# Patient Record
Sex: Female | Born: 1955 | Race: White | Hispanic: No | Marital: Married | Smoking: Former smoker
Health system: Southern US, Community
[De-identification: ages and names within clinical notes are randomized; demographics above are authoritative.]

## PROBLEM LIST (undated history)

## (undated) DIAGNOSIS — E785 Hyperlipidemia, unspecified: Secondary | ICD-10-CM

## (undated) DIAGNOSIS — C801 Malignant (primary) neoplasm, unspecified: Secondary | ICD-10-CM

## (undated) DIAGNOSIS — Z1379 Encounter for other screening for genetic and chromosomal anomalies: Secondary | ICD-10-CM

## (undated) DIAGNOSIS — K219 Gastro-esophageal reflux disease without esophagitis: Secondary | ICD-10-CM

## (undated) DIAGNOSIS — F419 Anxiety disorder, unspecified: Secondary | ICD-10-CM

## (undated) DIAGNOSIS — M8580 Other specified disorders of bone density and structure, unspecified site: Secondary | ICD-10-CM

## (undated) DIAGNOSIS — H269 Unspecified cataract: Secondary | ICD-10-CM

## (undated) DIAGNOSIS — Z8249 Family history of ischemic heart disease and other diseases of the circulatory system: Secondary | ICD-10-CM

## (undated) DIAGNOSIS — K4090 Unilateral inguinal hernia, without obstruction or gangrene, not specified as recurrent: Secondary | ICD-10-CM

## (undated) DIAGNOSIS — R079 Chest pain, unspecified: Secondary | ICD-10-CM

## (undated) HISTORY — DX: Chest pain, unspecified: R07.9

## (undated) HISTORY — DX: Gastro-esophageal reflux disease without esophagitis: K21.9

## (undated) HISTORY — PX: HERNIA REPAIR: SHX51

## (undated) HISTORY — PX: BREAST EXCISIONAL BIOPSY: SUR124

## (undated) HISTORY — DX: Anxiety disorder, unspecified: F41.9

## (undated) HISTORY — DX: Other specified disorders of bone density and structure, unspecified site: M85.80

## (undated) HISTORY — DX: Encounter for other screening for genetic and chromosomal anomalies: Z13.79

## (undated) HISTORY — DX: Malignant (primary) neoplasm, unspecified: C80.1

## (undated) HISTORY — DX: Hyperlipidemia, unspecified: E78.5

## (undated) HISTORY — PX: COLONOSCOPY: SHX174

## (undated) HISTORY — DX: Unspecified cataract: H26.9

## (undated) HISTORY — DX: Family history of ischemic heart disease and other diseases of the circulatory system: Z82.49

## (undated) HISTORY — PX: TONSILLECTOMY: SUR1361

## (undated) HISTORY — PX: WISDOM TOOTH EXTRACTION: SHX21

## (undated) HISTORY — DX: Unilateral inguinal hernia, without obstruction or gangrene, not specified as recurrent: K40.90

---
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
## 1991-06-05 HISTORY — PX: BREAST SURGERY: SHX581

## 1998-04-21 ENCOUNTER — Ambulatory Visit (HOSPITAL_COMMUNITY): Admission: RE | Admit: 1998-04-21 | Discharge: 1998-04-21 | Payer: Self-pay

## 1998-04-21 ENCOUNTER — Encounter: Payer: Self-pay | Admitting: *Deleted

## 1999-03-20 ENCOUNTER — Other Ambulatory Visit: Admission: RE | Admit: 1999-03-20 | Discharge: 1999-03-20 | Payer: Self-pay | Admitting: *Deleted

## 1999-04-25 ENCOUNTER — Ambulatory Visit (HOSPITAL_COMMUNITY): Admission: RE | Admit: 1999-04-25 | Discharge: 1999-04-25 | Payer: Self-pay | Admitting: *Deleted

## 1999-04-25 ENCOUNTER — Encounter: Payer: Self-pay | Admitting: *Deleted

## 2000-04-29 ENCOUNTER — Encounter: Payer: Self-pay | Admitting: *Deleted

## 2000-04-29 ENCOUNTER — Ambulatory Visit (HOSPITAL_COMMUNITY): Admission: RE | Admit: 2000-04-29 | Discharge: 2000-04-29 | Payer: Self-pay | Admitting: *Deleted

## 2000-07-03 ENCOUNTER — Other Ambulatory Visit: Admission: RE | Admit: 2000-07-03 | Discharge: 2000-07-03 | Payer: Self-pay | Admitting: *Deleted

## 2001-05-05 ENCOUNTER — Ambulatory Visit (HOSPITAL_COMMUNITY): Admission: RE | Admit: 2001-05-05 | Discharge: 2001-05-05 | Payer: Self-pay | Admitting: *Deleted

## 2001-05-05 ENCOUNTER — Encounter: Payer: Self-pay | Admitting: *Deleted

## 2001-07-30 ENCOUNTER — Other Ambulatory Visit: Admission: RE | Admit: 2001-07-30 | Discharge: 2001-07-30 | Payer: Self-pay | Admitting: *Deleted

## 2002-05-06 ENCOUNTER — Encounter: Payer: Self-pay | Admitting: *Deleted

## 2002-05-06 ENCOUNTER — Ambulatory Visit (HOSPITAL_COMMUNITY): Admission: RE | Admit: 2002-05-06 | Discharge: 2002-05-06 | Payer: Self-pay | Admitting: *Deleted

## 2002-05-07 ENCOUNTER — Encounter: Payer: Self-pay | Admitting: *Deleted

## 2002-05-07 ENCOUNTER — Encounter: Admission: RE | Admit: 2002-05-07 | Discharge: 2002-05-07 | Payer: Self-pay | Admitting: *Deleted

## 2002-09-02 ENCOUNTER — Other Ambulatory Visit: Admission: RE | Admit: 2002-09-02 | Discharge: 2002-09-02 | Payer: Self-pay | Admitting: *Deleted

## 2003-06-10 ENCOUNTER — Ambulatory Visit (HOSPITAL_COMMUNITY): Admission: RE | Admit: 2003-06-10 | Discharge: 2003-06-10 | Payer: Self-pay | Admitting: *Deleted

## 2003-10-14 ENCOUNTER — Other Ambulatory Visit: Admission: RE | Admit: 2003-10-14 | Discharge: 2003-10-14 | Payer: Self-pay | Admitting: *Deleted

## 2004-06-12 ENCOUNTER — Ambulatory Visit (HOSPITAL_COMMUNITY): Admission: RE | Admit: 2004-06-12 | Discharge: 2004-06-12 | Payer: Self-pay | Admitting: *Deleted

## 2004-11-22 ENCOUNTER — Encounter: Admission: RE | Admit: 2004-11-22 | Discharge: 2004-11-22 | Payer: Self-pay | Admitting: Family Medicine

## 2004-11-27 ENCOUNTER — Other Ambulatory Visit: Admission: RE | Admit: 2004-11-27 | Discharge: 2004-11-27 | Payer: Self-pay | Admitting: *Deleted

## 2004-12-04 ENCOUNTER — Ambulatory Visit (HOSPITAL_COMMUNITY): Admission: RE | Admit: 2004-12-04 | Discharge: 2004-12-04 | Payer: Self-pay | Admitting: Family Medicine

## 2004-12-04 ENCOUNTER — Encounter: Admission: RE | Admit: 2004-12-04 | Discharge: 2004-12-04 | Payer: Self-pay | Admitting: Family Medicine

## 2005-06-22 ENCOUNTER — Ambulatory Visit (HOSPITAL_COMMUNITY): Admission: RE | Admit: 2005-06-22 | Discharge: 2005-06-22 | Payer: Self-pay | Admitting: Family Medicine

## 2006-02-26 ENCOUNTER — Ambulatory Visit (HOSPITAL_COMMUNITY): Admission: RE | Admit: 2006-02-26 | Discharge: 2006-02-26 | Payer: Self-pay | Admitting: *Deleted

## 2006-05-13 ENCOUNTER — Emergency Department (HOSPITAL_COMMUNITY): Admission: EM | Admit: 2006-05-13 | Discharge: 2006-05-13 | Payer: Self-pay | Admitting: Emergency Medicine

## 2006-07-24 ENCOUNTER — Ambulatory Visit (HOSPITAL_COMMUNITY): Admission: RE | Admit: 2006-07-24 | Discharge: 2006-07-24 | Payer: Self-pay | Admitting: Family Medicine

## 2006-08-08 ENCOUNTER — Ambulatory Visit (HOSPITAL_COMMUNITY): Admission: RE | Admit: 2006-08-08 | Discharge: 2006-08-08 | Payer: Self-pay | Admitting: Family Medicine

## 2007-07-28 ENCOUNTER — Ambulatory Visit (HOSPITAL_COMMUNITY): Admission: RE | Admit: 2007-07-28 | Discharge: 2007-07-28 | Payer: Self-pay | Admitting: Family Medicine

## 2007-09-08 ENCOUNTER — Encounter: Admission: RE | Admit: 2007-09-08 | Discharge: 2007-09-08 | Payer: Self-pay | Admitting: Family Medicine

## 2007-09-19 ENCOUNTER — Encounter: Admission: RE | Admit: 2007-09-19 | Discharge: 2007-09-19 | Payer: Self-pay | Admitting: Family Medicine

## 2008-07-29 ENCOUNTER — Ambulatory Visit (HOSPITAL_COMMUNITY): Admission: RE | Admit: 2008-07-29 | Discharge: 2008-07-29 | Payer: Self-pay | Admitting: Family Medicine

## 2008-09-23 ENCOUNTER — Ambulatory Visit (HOSPITAL_COMMUNITY): Admission: RE | Admit: 2008-09-23 | Discharge: 2008-09-23 | Payer: Self-pay | Admitting: Family Medicine

## 2009-08-01 ENCOUNTER — Ambulatory Visit (HOSPITAL_COMMUNITY): Admission: RE | Admit: 2009-08-01 | Discharge: 2009-08-01 | Payer: Self-pay | Admitting: Family Medicine

## 2010-06-25 ENCOUNTER — Encounter: Payer: Self-pay | Admitting: Family Medicine

## 2010-07-18 ENCOUNTER — Other Ambulatory Visit (HOSPITAL_COMMUNITY): Payer: Self-pay | Admitting: Family Medicine

## 2010-07-18 DIAGNOSIS — Z1231 Encounter for screening mammogram for malignant neoplasm of breast: Secondary | ICD-10-CM

## 2010-08-03 ENCOUNTER — Other Ambulatory Visit: Payer: Self-pay | Admitting: Family Medicine

## 2010-08-03 ENCOUNTER — Ambulatory Visit (HOSPITAL_COMMUNITY)
Admission: RE | Admit: 2010-08-03 | Discharge: 2010-08-03 | Disposition: A | Discharge status: Home / Self Care | Payer: 59 | Source: Ambulatory Visit | Attending: Family Medicine | Admitting: Family Medicine

## 2010-08-03 DIAGNOSIS — N6324 Unspecified lump in the left breast, lower inner quadrant: Secondary | ICD-10-CM

## 2010-08-03 DIAGNOSIS — Z1231 Encounter for screening mammogram for malignant neoplasm of breast: Secondary | ICD-10-CM

## 2010-08-08 ENCOUNTER — Ambulatory Visit
Admission: RE | Admit: 2010-08-08 | Discharge: 2010-08-08 | Disposition: A | Discharge status: Home / Self Care | Payer: 59 | Source: Ambulatory Visit | Attending: Family Medicine | Admitting: Family Medicine

## 2010-08-08 DIAGNOSIS — N6324 Unspecified lump in the left breast, lower inner quadrant: Secondary | ICD-10-CM

## 2011-01-05 ENCOUNTER — Other Ambulatory Visit: Payer: Self-pay | Admitting: Family Medicine

## 2011-01-05 ENCOUNTER — Other Ambulatory Visit (HOSPITAL_COMMUNITY)
Admission: RE | Admit: 2011-01-05 | Discharge: 2011-01-05 | Disposition: A | Discharge status: Home / Self Care | Payer: 59 | Source: Ambulatory Visit | Attending: Family Medicine | Admitting: Family Medicine

## 2011-01-05 DIAGNOSIS — Z124 Encounter for screening for malignant neoplasm of cervix: Secondary | ICD-10-CM | POA: Insufficient documentation

## 2011-08-15 ENCOUNTER — Other Ambulatory Visit (HOSPITAL_COMMUNITY): Payer: Self-pay | Admitting: Family Medicine

## 2011-08-15 DIAGNOSIS — Z1231 Encounter for screening mammogram for malignant neoplasm of breast: Secondary | ICD-10-CM

## 2011-09-19 ENCOUNTER — Ambulatory Visit (HOSPITAL_COMMUNITY): Payer: 59

## 2011-09-26 ENCOUNTER — Ambulatory Visit (HOSPITAL_COMMUNITY): Payer: 59

## 2011-10-22 ENCOUNTER — Ambulatory Visit (HOSPITAL_COMMUNITY)
Admission: RE | Admit: 2011-10-22 | Discharge: 2011-10-22 | Disposition: A | Discharge status: Home / Self Care | Payer: PRIVATE HEALTH INSURANCE | Source: Ambulatory Visit | Attending: Family Medicine | Admitting: Family Medicine

## 2011-10-22 DIAGNOSIS — Z1231 Encounter for screening mammogram for malignant neoplasm of breast: Secondary | ICD-10-CM | POA: Insufficient documentation

## 2012-10-14 ENCOUNTER — Other Ambulatory Visit (HOSPITAL_COMMUNITY): Payer: Self-pay | Admitting: Family Medicine

## 2012-10-14 ENCOUNTER — Telehealth: Payer: Self-pay | Admitting: Internal Medicine

## 2012-10-14 DIAGNOSIS — Z1231 Encounter for screening mammogram for malignant neoplasm of breast: Secondary | ICD-10-CM

## 2012-10-14 NOTE — Telephone Encounter (Signed)
Left message for patient that I will be mailing out a lab slip to her. If she has not had any recent labs drawn through another provider, then she will need to get it. If she has , then she can just bring copies if those with her to the appointment.

## 2012-10-14 NOTE — Telephone Encounter (Signed)
Pt has appt w/ dr. Debara Pickett 10/31/12. Does she need to have lab work prior to visit?  Mail lab slip.

## 2012-10-24 ENCOUNTER — Ambulatory Visit (HOSPITAL_COMMUNITY): Payer: PRIVATE HEALTH INSURANCE

## 2012-10-31 ENCOUNTER — Ambulatory Visit: Payer: PRIVATE HEALTH INSURANCE | Admitting: Internal Medicine

## 2012-11-04 ENCOUNTER — Other Ambulatory Visit: Payer: Self-pay | Admitting: Internal Medicine

## 2012-11-04 LAB — LIPID PANEL
Cholesterol: 287 mg/dL — ABNORMAL HIGH (ref 0–200)
Total CHOL/HDL Ratio: 3.8 Ratio
VLDL: 16 mg/dL (ref 0–40)

## 2012-11-04 LAB — COMPREHENSIVE METABOLIC PANEL
AST: 15 U/L (ref 0–37)
Alkaline Phosphatase: 86 U/L (ref 39–117)
BUN: 11 mg/dL (ref 6–23)
CO2: 26 mEq/L (ref 19–32)
Chloride: 104 mEq/L (ref 96–112)
Creat: 0.86 mg/dL (ref 0.50–1.10)
Glucose, Bld: 99 mg/dL (ref 70–99)
Sodium: 141 mEq/L (ref 135–145)

## 2012-11-04 LAB — CBC
Hemoglobin: 14.2 g/dL (ref 12.0–15.0)
MCV: 89.4 fL (ref 78.0–100.0)
RDW: 14.2 % (ref 11.5–15.5)
WBC: 5.5 10*3/uL (ref 4.0–10.5)

## 2012-11-05 ENCOUNTER — Telehealth: Payer: Self-pay | Admitting: Internal Medicine

## 2012-11-05 NOTE — Telephone Encounter (Signed)
See result note.  

## 2012-11-05 NOTE — Telephone Encounter (Signed)
Theresa Frazier is returning your call about her test results .Marland Kitchen Please call her [phone]  thanks

## 2012-11-06 ENCOUNTER — Ambulatory Visit (HOSPITAL_COMMUNITY): Payer: PRIVATE HEALTH INSURANCE

## 2012-11-06 ENCOUNTER — Ambulatory Visit (HOSPITAL_COMMUNITY)
Admission: RE | Admit: 2012-11-06 | Discharge: 2012-11-06 | Disposition: A | Discharge status: Home / Self Care | Payer: 59 | Source: Ambulatory Visit | Attending: Family Medicine | Admitting: Family Medicine

## 2012-11-06 DIAGNOSIS — Z1231 Encounter for screening mammogram for malignant neoplasm of breast: Secondary | ICD-10-CM | POA: Insufficient documentation

## 2012-11-07 ENCOUNTER — Encounter: Payer: Self-pay | Admitting: Internal Medicine

## 2012-11-07 ENCOUNTER — Ambulatory Visit (INDEPENDENT_AMBULATORY_CARE_PROVIDER_SITE_OTHER): Payer: 59 | Admitting: Internal Medicine

## 2012-11-07 VITALS — BP 120/82 | HR 71 | Ht 62.0 in | Wt 174.7 lb

## 2012-11-07 DIAGNOSIS — R5383 Other fatigue: Secondary | ICD-10-CM | POA: Insufficient documentation

## 2012-11-07 DIAGNOSIS — Z8249 Family history of ischemic heart disease and other diseases of the circulatory system: Secondary | ICD-10-CM | POA: Insufficient documentation

## 2012-11-07 DIAGNOSIS — E785 Hyperlipidemia, unspecified: Secondary | ICD-10-CM

## 2012-11-07 DIAGNOSIS — R5381 Other malaise: Secondary | ICD-10-CM

## 2012-11-07 MED ORDER — ROSUVASTATIN CALCIUM 5 MG PO TABS: 5.0000 mg | ORAL_TABLET | Freq: Every day | ORAL | Status: DC

## 2012-11-07 NOTE — Patient Instructions (Addendum)
Dr. Debara Pickett prescribed Crestor 5mg  daily. If you are able to tolerate this medication, please give [place] a call in 2 months, as we would like to check your cholesterol levels.   Follow up with Dr. Debara Pickett in 1 year.

## 2012-11-07 NOTE — Progress Notes (Signed)
OFFICE NOTE  Chief Complaint:  Routine followup  Primary Care Physician: Osborne Casco, MD  HPI:  Theresa Frazier is a 57 year old female with a history of dyslipidemia, GERD, atypical chest pain in the past with a negative stress test. There is a family history of coronary disease and she underwent [place] which showed abnormal LDL particle sizes with high LPa and abnormal apoB protein. She had a normal CRP and we discussed possible treatment with medication at that time; however, she agreed to work on diet and exercise. It has now been 15 months since that time and, unfortunately, there has been little exercise and about only 2 pounds of weight loss. I went ahead and performed a lipid NMR which demonstrated total particle number of 1893, which is elevated. LDL calculated at 140, HDL of 77 with a normal HDL particle number and a small LDL particle number of 704. This overall does increase her risk of coronary disease; however, she does have a low LP-IR score of 19. This is in part due to her high HDL cholesterol. Symptomatically denies any chest pain, shortness of breath, palpitations, presyncope or syncopal symptoms. At her last visit I gave her a prescription for Crestor, however she reports she never took the medicine. She has concerns about side effects and actually donated the samples a local shelter because she felt bad throwing the medicine out.  We did have a scheduled recheck of her cholesterol which indicates a total cholesterol of 287 and LDL of 195.  This is increased despite her efforts at exercise and weight loss. Clearly there is a genetic component driving or elevated cholesterol. She continues to be at higher risk for cardiovascular events.  He did have a CBC and CMP which were both normal.  PMHx:  Past Medical History  Diagnosis Date  . Dyslipidemia   . GERD (gastroesophageal reflux disease)   . Chest pain     atypical, myoview 05/31/05-EF 75%, low risk   . Family history of early CAD     History reviewed. No pertinent past surgical history.  FAMHx:  Family History  Problem Relation Age of Onset  .       SOCHx:   reports that she quit smoking about 27 years ago. Her smoking use included Cigarettes. She smoked 0.00 packs per day for 7 years. She does not have any smokeless tobacco history on file. She reports that  drinks alcohol. She reports that she does not use illicit drugs.  ALLERGIES:  No Known Allergies  ROS: A comprehensive review of systems was negative except for: Constitutional: positive for fatigue  HOME MEDS: Current Outpatient Prescriptions  Medication Sig Dispense Refill  . Calcium-Magnesium-Vitamin D (CALCIUM MAGNESIUM PO) Take 3 tablets by mouth. 1000mg  calcium, 800 IU vitamin D, 500mg  magnesium      . Cholecalciferol (VITAMIN D) 400 UNIT/ML LIQD Take 4,000 Units by mouth daily.      Marland Kitchen Cod Liver Oil CAPS Take 2 capsules by mouth daily.      . Multiple Vitamins-Minerals (MULTIVITAMIN PO) Take 1 tablet by mouth daily.      . NON FORMULARY Ultra Emu Oil 3 gel caps daily      . omega-3 acid ethyl esters (LOVAZA) 1 G capsule Take 1 g by mouth 2 (two) times daily.      . rosuvastatin (CRESTOR) 5 MG tablet Take 1 tablet (5 mg total) by mouth daily.  30 tablet  3   No current facility-administered medications for this  visit.    LABS/IMAGING: No results found for this or any previous visit (from the past 48 hour(s)). Mm Digital Screening  11/07/2012   *RADIOLOGY REPORT*  Clinical Data: Screening.  DIGITAL SCREENING BILATERAL MAMMOGRAM WITH CAD  Comparison:  Previous exams.  FINDINGS:  ACR Breast Density Category 2: There is a scattered fibroglandular pattern.  There are no findings suspicious for malignancy.  Images were processed with CAD.  IMPRESSION: No mammographic evidence of malignancy.  A result letter of this screening mammogram will be mailed directly to the patient.  RECOMMENDATION: Screening mammogram in one  year. (Code:SM-B-01Y)  BI-RADS CATEGORY 1:  Negative.   Original Report Authenticated By: Skipper Cliche, M.D.    VITALS: BP 120/82  Pulse 71  Ht 5\' 2"  (1.575 m)  Wt 174 lb 11.2 oz (79.243 kg)  BMI 31.94 kg/m2  EXAM: General appearance: alert and no distress Neck: no adenopathy, no carotid bruit, no JVD, supple, symmetrical, trachea midline and thyroid not enlarged, symmetric, no tenderness/mass/nodules Lungs: clear to auscultation bilaterally Heart: regular rate and rhythm, S1, S2 normal, no murmur, click, rub or gallop Abdomen: soft, non-tender; bowel sounds normal; no masses,  no organomegaly Extremities: extremities normal, atraumatic, no cyanosis or edema Pulses: 2+ and symmetric Skin: Skin color, texture, turgor normal. No rashes or lesions Neurologic: Grossly normal  EKG: Sinus rhythm at 71  ASSESSMENT: 1. Hyperlipidemia 2. Fatigue  PLAN: 1.   Mr. Fulton Mole continues to have mild regarding the elevated cholesterol and her risk. With her high LDL greater than 190, she continues to be at risk for cardiovascular events. I strongly recommend cholesterol medication and again given her prescription for Crestor. Hopefully she will begin taking it. She takes it for 3 months I've asked her to contact her office and we'll recheck a cholesterol profile. She may need of course increase in the dose as it is unlikely to reduce her LDL by greater than 50%. She understands this. Of also encouraged her to continue to get more exercise. Plan to see her back annually or sooner as necessary.  Pixie Casino, MD, [facility] Attending Cardiologist [facility] 11/07/2012, 11:19 AM

## 2013-04-09 ENCOUNTER — Other Ambulatory Visit: Payer: Self-pay

## 2013-12-01 ENCOUNTER — Other Ambulatory Visit (HOSPITAL_COMMUNITY): Payer: Self-pay | Admitting: Family Medicine

## 2013-12-01 DIAGNOSIS — Z1231 Encounter for screening mammogram for malignant neoplasm of breast: Secondary | ICD-10-CM

## 2013-12-08 ENCOUNTER — Ambulatory Visit (HOSPITAL_COMMUNITY): Payer: Self-pay

## 2013-12-15 ENCOUNTER — Ambulatory Visit (HOSPITAL_COMMUNITY): Payer: Self-pay

## 2013-12-17 ENCOUNTER — Ambulatory Visit (HOSPITAL_COMMUNITY)
Admission: RE | Admit: 2013-12-17 | Discharge: 2013-12-17 | Disposition: A | Discharge status: Home / Self Care | Payer: BC Managed Care – PPO | Source: Ambulatory Visit | Attending: Family Medicine | Admitting: Family Medicine

## 2013-12-17 DIAGNOSIS — Z1231 Encounter for screening mammogram for malignant neoplasm of breast: Secondary | ICD-10-CM | POA: Insufficient documentation

## 2014-11-26 ENCOUNTER — Other Ambulatory Visit: Payer: Self-pay | Admitting: Adult Health

## 2014-11-26 DIAGNOSIS — Z1231 Encounter for screening mammogram for malignant neoplasm of breast: Secondary | ICD-10-CM

## 2014-12-22 ENCOUNTER — Ambulatory Visit (HOSPITAL_COMMUNITY)
Admission: RE | Admit: 2014-12-22 | Discharge: 2014-12-22 | Disposition: A | Discharge status: Home / Self Care | Payer: BLUE CROSS/BLUE SHIELD | Source: Ambulatory Visit | Attending: Adult Health | Admitting: Adult Health

## 2014-12-22 DIAGNOSIS — Z1231 Encounter for screening mammogram for malignant neoplasm of breast: Secondary | ICD-10-CM | POA: Diagnosis not present

## 2015-06-05 DIAGNOSIS — C569 Malignant neoplasm of unspecified ovary: Secondary | ICD-10-CM

## 2015-06-05 HISTORY — DX: Malignant neoplasm of unspecified ovary: C56.9

## 2015-09-06 DIAGNOSIS — M542 Cervicalgia: Secondary | ICD-10-CM | POA: Diagnosis not present

## 2015-09-20 DIAGNOSIS — M542 Cervicalgia: Secondary | ICD-10-CM | POA: Diagnosis not present

## 2015-09-27 DIAGNOSIS — M542 Cervicalgia: Secondary | ICD-10-CM | POA: Diagnosis not present

## 2015-11-14 ENCOUNTER — Other Ambulatory Visit (HOSPITAL_COMMUNITY)
Admission: RE | Admit: 2015-11-14 | Discharge: 2015-11-14 | Disposition: A | Discharge status: Home / Self Care | Payer: BLUE CROSS/BLUE SHIELD | Source: Ambulatory Visit | Attending: Family Medicine | Admitting: Family Medicine

## 2015-11-14 DIAGNOSIS — Z124 Encounter for screening for malignant neoplasm of cervix: Secondary | ICD-10-CM | POA: Insufficient documentation

## 2015-11-14 DIAGNOSIS — E782 Mixed hyperlipidemia: Secondary | ICD-10-CM | POA: Diagnosis not present

## 2015-11-14 DIAGNOSIS — Z0000 Encounter for general adult medical examination without abnormal findings: Secondary | ICD-10-CM | POA: Diagnosis not present

## 2015-11-14 DIAGNOSIS — N951 Menopausal and female climacteric states: Secondary | ICD-10-CM | POA: Diagnosis not present

## 2015-11-14 DIAGNOSIS — R0683 Snoring: Secondary | ICD-10-CM | POA: Diagnosis not present

## 2015-11-15 ENCOUNTER — Other Ambulatory Visit: Payer: Self-pay | Admitting: Family Medicine

## 2015-11-22 LAB — CYTOLOGY - PAP

## 2015-12-14 DIAGNOSIS — M8588 Other specified disorders of bone density and structure, other site: Secondary | ICD-10-CM | POA: Diagnosis not present

## 2015-12-16 ENCOUNTER — Other Ambulatory Visit: Payer: Self-pay | Admitting: Family Medicine

## 2015-12-16 DIAGNOSIS — Z1231 Encounter for screening mammogram for malignant neoplasm of breast: Secondary | ICD-10-CM

## 2015-12-19 DIAGNOSIS — Z1211 Encounter for screening for malignant neoplasm of colon: Secondary | ICD-10-CM | POA: Diagnosis not present

## 2015-12-26 ENCOUNTER — Ambulatory Visit
Admission: RE | Admit: 2015-12-26 | Discharge: 2015-12-26 | Disposition: A | Discharge status: Home / Self Care | Payer: BLUE CROSS/BLUE SHIELD | Source: Ambulatory Visit | Attending: Family Medicine | Admitting: Family Medicine

## 2015-12-26 DIAGNOSIS — Z1231 Encounter for screening mammogram for malignant neoplasm of breast: Secondary | ICD-10-CM | POA: Diagnosis not present

## 2015-12-28 ENCOUNTER — Other Ambulatory Visit: Payer: Self-pay | Admitting: Family Medicine

## 2015-12-28 DIAGNOSIS — R928 Other abnormal and inconclusive findings on diagnostic imaging of breast: Secondary | ICD-10-CM

## 2015-12-30 ENCOUNTER — Ambulatory Visit
Admission: RE | Admit: 2015-12-30 | Discharge: 2015-12-30 | Disposition: A | Discharge status: Home / Self Care | Payer: BLUE CROSS/BLUE SHIELD | Source: Ambulatory Visit | Attending: Family Medicine | Admitting: Family Medicine

## 2015-12-30 DIAGNOSIS — R922 Inconclusive mammogram: Secondary | ICD-10-CM | POA: Diagnosis not present

## 2015-12-30 DIAGNOSIS — N6489 Other specified disorders of breast: Secondary | ICD-10-CM | POA: Diagnosis not present

## 2015-12-30 DIAGNOSIS — R928 Other abnormal and inconclusive findings on diagnostic imaging of breast: Secondary | ICD-10-CM

## 2016-02-24 DIAGNOSIS — H40011 Open angle with borderline findings, low risk, right eye: Secondary | ICD-10-CM | POA: Diagnosis not present

## 2016-02-24 DIAGNOSIS — H40012 Open angle with borderline findings, low risk, left eye: Secondary | ICD-10-CM | POA: Diagnosis not present

## 2016-04-25 ENCOUNTER — Other Ambulatory Visit: Payer: Self-pay | Admitting: Family Medicine

## 2016-04-25 DIAGNOSIS — R102 Pelvic and perineal pain: Secondary | ICD-10-CM | POA: Diagnosis not present

## 2016-04-25 DIAGNOSIS — R1900 Intra-abdominal and pelvic swelling, mass and lump, unspecified site: Secondary | ICD-10-CM | POA: Diagnosis not present

## 2016-05-04 ENCOUNTER — Ambulatory Visit
Admission: RE | Admit: 2016-05-04 | Discharge: 2016-05-04 | Disposition: A | Discharge status: Home / Self Care | Payer: BLUE CROSS/BLUE SHIELD | Source: Ambulatory Visit | Attending: Family Medicine | Admitting: Family Medicine

## 2016-05-04 ENCOUNTER — Other Ambulatory Visit: Payer: BLUE CROSS/BLUE SHIELD

## 2016-05-04 DIAGNOSIS — R102 Pelvic and perineal pain: Secondary | ICD-10-CM

## 2016-05-04 DIAGNOSIS — R1907 Generalized intra-abdominal and pelvic swelling, mass and lump: Secondary | ICD-10-CM | POA: Diagnosis not present

## 2016-05-08 ENCOUNTER — Other Ambulatory Visit: Payer: BLUE CROSS/BLUE SHIELD

## 2016-05-14 ENCOUNTER — Encounter: Payer: Self-pay | Admitting: Gynecologic Oncology

## 2016-05-14 ENCOUNTER — Other Ambulatory Visit (HOSPITAL_BASED_OUTPATIENT_CLINIC_OR_DEPARTMENT_OTHER): Payer: BLUE CROSS/BLUE SHIELD

## 2016-05-14 ENCOUNTER — Ambulatory Visit: Payer: BLUE CROSS/BLUE SHIELD | Attending: Gynecologic Oncology | Admitting: Gynecologic Oncology

## 2016-05-14 VITALS — BP 154/92 | HR 82 | Temp 98.3°F | Resp 16 | Ht 62.0 in | Wt 165.7 lb

## 2016-05-14 DIAGNOSIS — Z87891 Personal history of nicotine dependence: Secondary | ICD-10-CM | POA: Diagnosis not present

## 2016-05-14 DIAGNOSIS — K219 Gastro-esophageal reflux disease without esophagitis: Secondary | ICD-10-CM | POA: Insufficient documentation

## 2016-05-14 DIAGNOSIS — Z8249 Family history of ischemic heart disease and other diseases of the circulatory system: Secondary | ICD-10-CM | POA: Insufficient documentation

## 2016-05-14 DIAGNOSIS — R1900 Intra-abdominal and pelvic swelling, mass and lump, unspecified site: Secondary | ICD-10-CM

## 2016-05-14 DIAGNOSIS — E785 Hyperlipidemia, unspecified: Secondary | ICD-10-CM | POA: Diagnosis not present

## 2016-05-14 DIAGNOSIS — N839 Noninflammatory disorder of ovary, fallopian tube and broad ligament, unspecified: Secondary | ICD-10-CM | POA: Diagnosis not present

## 2016-05-14 DIAGNOSIS — R1909 Other intra-abdominal and pelvic swelling, mass and lump: Secondary | ICD-10-CM | POA: Insufficient documentation

## 2016-05-14 NOTE — Patient Instructions (Signed)
CT scan at [facility] located at [address] at 1:30pm need to arrive at 12:30pm.  Appointment for CA 125 today at 10:45.                Preparing for your Surgery  Plan for surgery on .12/28 or January 18th, depending on CT result.,with Dr. Everitt Amber. Pre-operative Testing -You will receive a phone call from presurgical testing at [facility] to arrange for a pre-operative testing appointment before your surgery.  This appointment normally occurs one to two weeks before your scheduled surgery.   -Bring your insurance card, copy of an advanced directive if applicable, medication list  -At that visit, you will be asked to sign a consent for a possible blood transfusion in case a transfusion becomes necessary during surgery.  The need for a blood transfusion is rare but having consent is a necessary part of your care.     -You should not be taking blood thinners or aspirin at least ten days prior to surgery unless instructed by your surgeon.  Day Before Surgery at [facility] will be asked to take in a light diet the day before surgery.  Avoid carbonated beverages.  You will be advised to have nothing to eat or drink after midnight the evening before.     Eat a light diet the day before surgery.  Examples including soups, broths,  toast, yogurt, mashed potatoes.  Things to avoid include carbonated beverages  (fizzy beverages), raw fruits and raw vegetables, or beans.    If your bowels are filled with gas, your surgeon will have difficulty  visualizing your pelvic organs which increases your surgical risks.  Your role in recovery Your role is to become active as soon as directed by your doctor, while still giving yourself time to heal.  Rest when you feel tired. You will be asked to do the following in order to speed your recovery:  - Cough and breathe deeply. This helps toclear and expand your lungs and can prevent pneumonia. You may be given a spirometer to practice  deep breathing. A staff member will show you how to use the spirometer. - Do mild physical activity. Walking or moving your legs help your circulation and body functions return to normal. A staff member will help you when you try to walk and will provide you with simple exercises. Do not try to get up or walk alone the first time. - Actively manage your pain. Managing your pain lets you move in comfort. We will ask you to rate your pain on a scale of zero to 10. It is your responsibility to tell your doctor or nurse where and how much you hurt so your pain can be treated.  Special Considerations -If you are diabetic, you may be placed on insulin after surgery to have closer control over your blood sugars to promote healing and recovery.  This does not mean that you will be discharged on insulin.  If applicable, your oral antidiabetics will be resumed when you are tolerating a solid diet.  -Your final pathology results from surgery should be available by the Friday after surgery and the results will be relayed to you when available.  Eat a light diet the day before surgery.  Examples including soups, broths, toast, yogurt, mashed potatoes.  Things to avoid include carbonated beverages (fizzy beverages), raw fruits and raw vegetables, or beans.   If your bowels are filled with gas, your surgeon will have difficulty  visualizing your pelvic organs which increases your surgical risks. Blood Transfusion Information WHAT IS A BLOOD TRANSFUSION? A transfusion is the replacement of blood or some of its parts. Blood is made up of multiple cells which provide different functions.  Red blood cells carry oxygen and are used for blood loss replacement.  White blood cells fight against infection.  Platelets control bleeding.  Plasma helps clot blood.  Other blood products are available for specialized needs, such as hemophilia or other clotting disorders. BEFORE THE TRANSFUSION  Who gives blood for  transfusions?   You may be able to donate blood to be used at a later date on yourself (autologous donation).  Relatives can be asked to donate blood. This is generally not any safer than if you have received blood from a stranger. The same precautions are taken to ensure safety when a relative's blood is donated.  Healthy volunteers who are fully evaluated to make sure their blood is safe. This is blood bank blood. Transfusion therapy is the safest it has ever been in the practice of medicine. Before blood is taken from a donor, a complete history is taken to make sure that person has no history of diseases nor engages in risky social behavior (examples are intravenous drug use or sexual activity with multiple partners). The donor's travel history is screened to minimize risk of transmitting infections, such as malaria. The donated blood is tested for signs of infectious diseases, such as HIV and hepatitis. The blood is then tested to be sure it is compatible with you in order to minimize the chance of a transfusion reaction. If you or a relative donates blood, this is often done in anticipation of surgery and is not appropriate for emergency situations. It takes many days to process the donated blood. RISKS AND COMPLICATIONS Although transfusion therapy is very safe and saves many lives, the main dangers of transfusion include:   Getting an infectious disease.  Developing a transfusion reaction. This is an allergic reaction to something in the blood you were given. Every precaution is taken to prevent this. The decision to have a blood transfusion has been considered carefully by your caregiver before blood is given. Blood is not given unless the benefits outweigh the risks.

## 2016-05-14 NOTE — Progress Notes (Signed)
Consult Note: Gyn-Onc  Consult was requested by Dr. Marisue Humble and Dr Laurann Montana for the evaluation of Theresa Frazier 60 y.o. female  CC:  Chief Complaint  Patient presents with  . pelvic mass    Assessment/Plan:  Theresa Frazier  is a 60 y.o.  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  I reviewed her [place] images from 05/04/16 with the patient.  I discussed that there are some concerning features on imaging (the solid components of the mass, its vascularity and the size), however there were also reassuring findings including how mobile it felt on exam and the lack of apparent extraovarian disease.  I recommend its removal with hysterectomy, BSO and possible staging pending frozen section results.  We have ordered CA 125 today and CT abdomen and pelvis to better evaluate the upper abdomen and the likelihood of malignancy and will determine route of surgery (open or robotic) based upon these results.  I discussed that if malignancy is identified, additional therapy (such as chemotherapy) is usually prescribed. I discussed surgical risks including  bleeding, infection, damage to internal organs (such as bladder,ureters, bowels), blood clot, reoperation and rehospitalization.  I discussed that we will better understand anticipated hospital stay and recovery when we know which surgical route is most appropriate.   HPI: Theresa Frazier is a 60 year old parous (P2) woman who is seen in consultation at the request of Dr Marisue Humble and Dr Laurann Montana for a complex cystic and solid pelvic mass.  The patient reported to her PCP's that she had abdominal bloating and fullness for 6-8 weeks since October 2017 when she saw them on 04/25/16. They performed a pelvic examination on that visit and appreciated a pelvic mass. A TVUS was ordered and performed on 05/04/16 and revealed a uterus measuring 8.4x3.7x4.8cm with a 7cm endometrial stripe (she has had no bleeding  symptoms) and the ovaries were not discretely seen. Instead, there is a large complex cystic and solid mass at midline, extending into the right and left adnexum with internal blood flow. The mass measures 11 x 7.4 x 14.1 cm. Separate ovaries are not identified.  A CT scan had been ordered but canceled upon the US findings and plan for consultation with me.  She is feeling fairly well. Her pants fit differently lately and she feels she may have lost weight. Her bladder and bowels are moving normally.  She denies pain.  She has no family history for breast/ovarian cancer or any other significant cancer history.  Her surgical history is significant for tonsillectomy only (no abdominal surgery).  Current Meds:  Outpatient Encounter Prescriptions as of 05/14/2016  Medication Sig  . BLACK CURRANT SEED OIL PO Take by mouth.  . Calcium-Magnesium-Vitamin D (CALCIUM MAGNESIUM PO) Take 3 tablets by mouth. 1000mg  calcium, 800 IU vitamin D, 500mg  magnesium  . Cholecalciferol (VITAMIN D) 400 UNIT/ML LIQD Take 4,000 Units by mouth daily.  Marland Kitchen Cod Liver Oil CAPS Take 2 capsules by mouth daily.  . Multiple Vitamins-Minerals (MULTIVITAMIN PO) Take 1 tablet by mouth daily.  . NON FORMULARY Ultra Emu Oil 3 gel caps daily  . omega-3 acid ethyl esters (LOVAZA) 1 G capsule Take 1 g by mouth 2 (two) times daily.  . rosuvastatin (CRESTOR) 5 MG tablet Take 1 tablet (5 mg total) by mouth daily. (Patient not taking: Reported on 05/14/2016)   No facility-administered encounter medications on file as of 05/14/2016.     Allergy:  No Known Allergies  Social Hx:   Social History   Social History  . Marital status: Married    Spouse name: N/A  . Number of children: N/A  . Years of education: N/A   Occupational History  . Not on file.   Social History Main Topics  . Smoking status: Former Smoker    Years: 7.00    Types: Cigarettes    Quit date: 11/07/1985  . Smokeless tobacco: Never Used  . Alcohol use No      Comment: occasional wine  . Drug use: No  . Sexual activity: Not on file   Other Topics Concern  . Not on file   Social History Narrative  . No narrative on file    Past Surgical Hx: History reviewed. No pertinent surgical history.  Past Medical Hx:  Past Medical History:  Diagnosis Date  . 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 Dyslipidemia   . Family history of early CAD   . GERD (gastroesophageal reflux disease)     Past Gynecological History:  SVD x 2 No LMP recorded. Patient is postmenopausal.  Family Hx: History reviewed. No pertinent family history.  Review of Systems:  Constitutional  Feels well,    ENT Normal appearing ears and nares bilaterally Skin/Breast  No rash, sores, jaundice, itching, dryness Cardiovascular  No chest pain, shortness of breath, or edema  Pulmonary  No cough or wheeze.  Gastro Intestinal  No nausea, vomitting, or diarrhoea. No bright red blood per rectum, no abdominal pain, change in bowel movement, or constipation.  Genito Urinary  No frequency, urgency, dysuria, no bleeding Musculo Skeletal  No myalgia, arthralgia, joint swelling or pain  Neurologic  No weakness, numbness, change in gait,  Psychology  No depression, anxiety, insomnia.   Vitals:  Blood pressure (!) 154/92, pulse 82, temperature 98.3 F (36.8 C), temperature source Oral, resp. rate 16, height 5\' 2"  (1.575 m), weight 165 lb 11.2 oz (75.2 kg), SpO2 99 %.  Physical Exam: WD in NAD Neck  Supple NROM, without any enlargements.  Lymph Node Survey No cervical supraclavicular or inguinal adenopathy Cardiovascular  Pulse normal rate, regularity and rhythm. S1 and S2 normal.  Lungs  Clear to auscultation bilateraly, without wheezes/crackles/rhonchi. Good air movement.  Skin  No rash/lesions/breakdown  Psychiatry  Alert and oriented to person, place, and time  Abdomen  Normoactive bowel sounds, abdomen soft, non-tender and nonobese  without evidence of hernia.  Back No CVA tenderness Genito Urinary  Vulva/vagina: Normal external female genitalia.  No lesions. No discharge or bleeding.  Bladder/urethra:  No lesions or masses, well supported bladder  Vagina: normal  Cervix: Normal appearing, no lesions.  Uterus:  Small, mobile, no parametrial involvement or nodularity.  Adnexa: palpable 10+cm firm, solid mass appreciated in the right pelvis. It is very mobile and non-tender. It is superior to the uterus.   Rectal  Good tone, no masses no cul de sac nodularity.  Extremities  No bilateral cyanosis, clubbing or edema.   Donaciano Eva, MD  05/14/2016, 11:50 AM

## 2016-05-15 ENCOUNTER — Telehealth: Payer: Self-pay

## 2016-05-15 LAB — CA 125: CANCER ANTIGEN (CA) 125: 71.9 U/mL — AB (ref 0.0–38.1)

## 2016-05-15 NOTE — Telephone Encounter (Addendum)
Theresa Frazier with [facility] called.  CT currently ordered as pancreatic protocol abd/ pelvis with and without IV. [facility] is saying it needs to be abd /pelvis with contrast. This is assuming this is a pelvic workup not pancreatic. Please call Theresa Frazier when this is corrected so she can instruct the pt accordingly. Her [phone].

## 2016-05-16 ENCOUNTER — Ambulatory Visit
Admission: RE | Admit: 2016-05-16 | Discharge: 2016-05-16 | Disposition: A | Discharge status: Home / Self Care | Payer: BLUE CROSS/BLUE SHIELD | Source: Ambulatory Visit | Attending: Gynecologic Oncology | Admitting: Gynecologic Oncology

## 2016-05-16 DIAGNOSIS — R109 Unspecified abdominal pain: Secondary | ICD-10-CM | POA: Diagnosis not present

## 2016-05-16 DIAGNOSIS — R1900 Intra-abdominal and pelvic swelling, mass and lump, unspecified site: Secondary | ICD-10-CM

## 2016-05-16 MED ORDER — IOPAMIDOL (ISOVUE-300) INJECTION 61%: 100.0000 mL | Freq: Once | INTRAVENOUS | Status: DC | PRN

## 2016-05-21 ENCOUNTER — Telehealth: Payer: Self-pay | Admitting: Gynecologic Oncology

## 2016-05-21 ENCOUNTER — Telehealth: Payer: Self-pay | Admitting: Nurse Practitioner

## 2016-05-21 NOTE — Telephone Encounter (Signed)
Patient calling to see if Dr. Denman George has reviewed CA 125 and CT results and can discuss surgical recommendations based on those findings. Note given to Dr. Denman George who is planning to call patient after clinic today.

## 2016-05-21 NOTE — Telephone Encounter (Signed)
Informed patient of results of imaging and labs.  I am recommending ex lap, RSO, possible hysterectomy, possible LSO, possible staging.  Patient will consider what she would like to do regarding hysterectomy and LSO if pathology is benign and will inform us preop. We will proceed with surgery on the 28th December.  Donaciano Eva, MD

## 2016-05-22 NOTE — Progress Notes (Signed)
Scheduling pre op-  Please place SURGICAL ORDERS IN EPIC THANKS

## 2016-05-23 NOTE — Patient Instructions (Addendum)
Theresa Frazier  05/23/2016   Your procedure is scheduled on: Thursday 05/31/2016  Report to [facility]  Entrance take [place]  elevators to 3rd floor to  [place] at  [place] AM.  Call this number if you have problems the morning of surgery [phone]   Remember: ONLY 1 PERSON MAY GO WITH YOU TO SHORT STAY TO GET  READY MORNING OF [name].   Eat a light diet the day before surgery.  Examples including soups, broths, toast, yogurt, mashed potatoes.  Things to avoid include carbonated beverages (fizzy beverages), raw fruits and raw vegetables, or beans.   If your bowels are filled with gas, your surgeon will have difficulty visualizing your pelvic organs which increases your surgical risks.    Do not eat food or drink liquids :After Midnight.     Take these medicines the morning of surgery with A SIP OF WATER:  May use Isopto tears if needed                                  You may not have any metal on your body including hair pins and              piercings  Do not wear jewelry, make-up, lotions, powders or perfumes, deodorant             Do not wear nail polish.  Do not shave  48 hours prior to surgery.              Men may shave face and neck.   Do not bring valuables to the hospital. [place].  Contacts, dentures or bridgework may not be worn into surgery.  Leave suitcase in the car. After surgery it may be brought to your room.                  Please read over the following fact sheets you were given: _____________________________________________________________________             [facility] - Preparing for Surgery Before surgery, you can play an important role.  Because skin is not sterile, your skin needs to be as free of germs as possible.  You can reduce the number of germs on your skin by washing with CHG (chlorahexidine gluconate) soap before surgery.  CHG is an  antiseptic cleaner which kills germs and bonds with the skin to continue killing germs even after washing. Please DO NOT use if you have an allergy to CHG or antibacterial soaps.  If your skin becomes reddened/irritated stop using the CHG and inform your nurse when you arrive at Short Stay. Do not shave (including legs and underarms) for at least 48 hours prior to the first CHG shower.  You may shave your face/neck. Please follow these instructions carefully:  1.  Shower with CHG Soap the night before surgery and the  morning of Surgery.  2.  If you choose to wash your hair, wash your hair first as usual with your  normal  shampoo.  3.  After you shampoo, rinse your hair and body thoroughly to remove the  shampoo.  4.  Use CHG as you would any other liquid soap.  You can apply chg directly  to the skin and wash                       Gently with a scrungie or clean washcloth.  5.  Apply the CHG Soap to your body ONLY FROM THE NECK DOWN.   Do not use on face/ open                           Wound or open sores. Avoid contact with eyes, ears mouth and genitals (private parts).                       Wash face,  Genitals (private parts) with your normal soap.             6.  Wash thoroughly, paying special attention to the area where your surgery  will be performed.  7.  Thoroughly rinse your body with warm water from the neck down.  8.  DO NOT shower/wash with your normal soap after using and rinsing off  the CHG Soap.                9.  Pat yourself dry with a clean towel.            10.  Wear clean pajamas.            11.  Place clean sheets on your bed the night of your first shower and do not  sleep with pets. Day of Surgery : Do not apply any lotions/deodorants the morning of surgery.  Please wear clean clothes to the hospital/surgery center.  FAILURE TO FOLLOW THESE INSTRUCTIONS MAY RESULT IN THE CANCELLATION OF YOUR SURGERY PATIENT  SIGNATURE_________________________________  NURSE SIGNATURE__________________________________  ________________________________________________________________________   Theresa Frazier  An incentive spirometer is a tool that can help keep your lungs clear and active. This tool measures how well you are filling your lungs with each breath. Taking long deep breaths may help reverse or decrease the chance of developing breathing (pulmonary) problems (especially infection) following:  A long period of time when you are unable to move or be active. BEFORE THE PROCEDURE   If the spirometer includes an indicator to show your best effort, your nurse or respiratory therapist will set it to a desired goal.  If possible, sit up straight or lean slightly forward. Try not to slouch.  Hold the incentive spirometer in an upright position. INSTRUCTIONS FOR USE  1. Sit on the edge of your bed if possible, or sit up as far as you can in bed or on a chair. 2. Hold the incentive spirometer in an upright position. 3. Breathe out normally. 4. Place the mouthpiece in your mouth and seal your lips tightly around it. 5. Breathe in slowly and as deeply as possible, raising the piston or the ball toward the top of the column. 6. Hold your breath for 3-5 seconds or for as long as possible. Allow the piston or ball to fall to the bottom of the column. 7. Remove the mouthpiece from your mouth and breathe out normally. 8. Rest for a few seconds and repeat Steps 1 through 7 at least 10 times every 1-2 hours when you are awake. Take your time and take a few normal breaths between deep breaths. 9. The spirometer may include an indicator to show  your best effort. Use the indicator as a goal to work toward during each repetition. 10. After each set of 10 deep breaths, practice coughing to be sure your lungs are clear. If you have an incision (the cut made at the time of surgery), support your incision when coughing  by placing a pillow or rolled up towels firmly against it. Once you are able to get out of bed, walk around indoors and cough well. You may stop using the incentive spirometer when instructed by your caregiver.  RISKS AND COMPLICATIONS  Take your time so you do not get dizzy or light-headed.  If you are in pain, you may need to take or ask for pain medication before doing incentive spirometry. It is harder to take a deep breath if you are having pain. AFTER USE  Rest and breathe slowly and easily.  It can be helpful to keep track of a log of your progress. Your caregiver can provide you with a simple table to help with this. If you are using the spirometer at home, follow these instructions: [place] IF:   You are having difficultly using the spirometer.  You have trouble using the spirometer as often as instructed.  Your pain medication is not giving enough relief while using the spirometer.  You develop fever of 100.5 F (38.1 C) or higher. SEEK IMMEDIATE MEDICAL CARE IF:   You cough up bloody sputum that had not been present before.  You develop fever of 102 F (38.9 C) or greater.  You develop worsening pain at or near the incision site. MAKE SURE YOU:   Understand these instructions.  Will watch your condition.  Will get help right away if you are not doing well or get worse. Document Released: 10/01/2006 Document Revised: 08/13/2011 Document Reviewed: 12/02/2006 ExitCare Patient Information 2014 Exit[employer], [place].   ________________________________________________________________________  WHAT IS A BLOOD TRANSFUSION? Blood Transfusion Information  A transfusion is the replacement of blood or some of its parts. Blood is made up of multiple cells which provide different functions.  Red blood cells carry oxygen and are used for blood loss replacement.  White blood cells fight against infection.  Platelets control bleeding.  Plasma helps clot  blood.  Other blood products are available for specialized needs, such as hemophilia or other clotting disorders. BEFORE THE TRANSFUSION  Who gives blood for transfusions?   Healthy volunteers who are fully evaluated to make sure their blood is safe. This is blood bank blood. Transfusion therapy is the safest it has ever been in the practice of medicine. Before blood is taken from a donor, a complete history is taken to make sure that person has no history of diseases nor engages in risky social behavior (examples are intravenous drug use or sexual activity with multiple partners). The donor's travel history is screened to minimize risk of transmitting infections, such as malaria. The donated blood is tested for signs of infectious diseases, such as HIV and hepatitis. The blood is then tested to be sure it is compatible with you in order to minimize the chance of a transfusion reaction. If you or a relative donates blood, this is often done in anticipation of surgery and is not appropriate for emergency situations. It takes many days to process the donated blood. RISKS AND COMPLICATIONS Although transfusion therapy is very safe and saves many lives, the main dangers of transfusion include:   Getting an infectious disease.  Developing a transfusion reaction. This is an allergic reaction to  something in the blood you were given. Every precaution is taken to prevent this. The decision to have a blood transfusion has been considered carefully by your caregiver before blood is given. Blood is not given unless the benefits outweigh the risks. AFTER THE TRANSFUSION  Right after receiving a blood transfusion, you will usually feel much better and more energetic. This is especially true if your red blood cells have gotten low (anemic). The transfusion raises the level of the red blood cells which carry oxygen, and this usually causes an energy increase.  The nurse administering the transfusion will monitor  you carefully for complications. HOME CARE INSTRUCTIONS  No special instructions are needed after a transfusion. You may find your energy is better. Speak with your caregiver about any limitations on activity for underlying diseases you may have. SEEK MEDICAL CARE IF:   Your condition is not improving after your transfusion.  You develop redness or irritation at the intravenous (IV) site. SEEK IMMEDIATE MEDICAL CARE IF:  Any of the following symptoms occur over the next 12 hours:  Shaking chills.  You have a temperature by mouth above 102 F (38.9 C), not controlled by medicine.  Chest, back, or muscle pain.  People around you feel you are not acting correctly or are confused.  Shortness of breath or difficulty breathing.  Dizziness and fainting.  You get a rash or develop hives.  You have a decrease in urine output.  Your urine turns a dark color or changes to pink, red, or brown. Any of the following symptoms occur over the next 10 days:  You have a temperature by mouth above 102 F (38.9 C), not controlled by medicine.  Shortness of breath.  Weakness after normal activity.  The white part of the eye turns yellow (jaundice).  You have a decrease in the amount of urine or are urinating less often.  Your urine turns a dark color or changes to pink, red, or brown. Document Released: 05/18/2000 Document Revised: 08/13/2011 Document Reviewed: 01/05/2008 [facility] Patient Information 2014 [place], [place].  _______________________________________________________________________

## 2016-05-24 ENCOUNTER — Encounter (HOSPITAL_COMMUNITY): Payer: Self-pay

## 2016-05-24 ENCOUNTER — Encounter (HOSPITAL_COMMUNITY)
Admission: RE | Admit: 2016-05-24 | Discharge: 2016-05-24 | Disposition: A | Discharge status: Home / Self Care | Payer: BLUE CROSS/BLUE SHIELD | Source: Ambulatory Visit | Attending: Gynecologic Oncology | Admitting: Gynecologic Oncology

## 2016-05-24 DIAGNOSIS — Z8249 Family history of ischemic heart disease and other diseases of the circulatory system: Secondary | ICD-10-CM | POA: Diagnosis not present

## 2016-05-24 DIAGNOSIS — R1900 Intra-abdominal and pelvic swelling, mass and lump, unspecified site: Secondary | ICD-10-CM | POA: Insufficient documentation

## 2016-05-24 DIAGNOSIS — R5383 Other fatigue: Secondary | ICD-10-CM | POA: Insufficient documentation

## 2016-05-24 DIAGNOSIS — E785 Hyperlipidemia, unspecified: Secondary | ICD-10-CM | POA: Insufficient documentation

## 2016-05-24 DIAGNOSIS — Z01812 Encounter for preprocedural laboratory examination: Secondary | ICD-10-CM | POA: Diagnosis not present

## 2016-05-24 LAB — COMPREHENSIVE METABOLIC PANEL
ALBUMIN: 4.2 g/dL (ref 3.5–5.0)
ALK PHOS: 66 U/L (ref 38–126)
ALT: 16 U/L (ref 14–54)
AST: 17 U/L (ref 15–41)
Anion gap: 8 (ref 5–15)
BILIRUBIN TOTAL: 0.4 mg/dL (ref 0.3–1.2)
BUN: 7 mg/dL (ref 6–20)
CALCIUM: 9 mg/dL (ref 8.9–10.3)
CO2: 26 mmol/L (ref 22–32)
CREATININE: 0.72 mg/dL (ref 0.44–1.00)
Chloride: 105 mmol/L (ref 101–111)
GFR calc Af Amer: >60 mL/min (ref >60)
GFR calc non Af Amer: >60 mL/min (ref >60)
GLUCOSE: 96 mg/dL (ref 65–99)
Potassium: 4.1 mmol/L (ref 3.5–5.1)
SODIUM: 139 mmol/L (ref 135–145)
TOTAL PROTEIN: 5.9 g/dL — AB (ref 6.5–8.1)

## 2016-05-24 LAB — CBC WITH DIFFERENTIAL/PLATELET
BASOS PCT: 1 %
Basophils Absolute: 0 10*3/uL (ref 0.0–0.1)
EOS ABS: 0.2 10*3/uL (ref 0.0–0.7)
Eosinophils Relative: 4 %
HEMATOCRIT: 42.7 % (ref 36.0–46.0)
HEMOGLOBIN: 14 g/dL (ref 12.0–15.0)
LYMPHS ABS: 1.6 10*3/uL (ref 0.7–4.0)
Lymphocytes Relative: 30 %
MCH: 30.5 pg (ref 26.0–34.0)
MCHC: 32.8 g/dL (ref 30.0–36.0)
MCV: 93 fL (ref 78.0–100.0)
MONOS PCT: 10 %
Monocytes Absolute: 0.5 10*3/uL (ref 0.1–1.0)
NEUTROS ABS: 3 10*3/uL (ref 1.7–7.7)
NEUTROS PCT: 55 %
Platelets: 261 10*3/uL (ref 150–400)
RBC: 4.59 MIL/uL (ref 3.87–5.11)
RDW: 12.8 % (ref 11.5–15.5)
WBC: 5.3 10*3/uL (ref 4.0–10.5)

## 2016-05-24 LAB — URINALYSIS, ROUTINE W REFLEX MICROSCOPIC
BILIRUBIN URINE: NEGATIVE
Glucose, UA: NEGATIVE mg/dL
Hgb urine dipstick: NEGATIVE
Ketones, ur: NEGATIVE mg/dL
LEUKOCYTES UA: NEGATIVE
NITRITE: NEGATIVE
Protein, ur: NEGATIVE mg/dL
Specific Gravity, Urine: 1.003 — ABNORMAL LOW (ref 1.005–1.030)
pH: 7 (ref 5.0–8.0)

## 2016-05-24 LAB — ABO/RH: ABO/RH(D): O POS

## 2016-05-25 ENCOUNTER — Telehealth: Payer: Self-pay | Admitting: Gynecologic Oncology

## 2016-05-25 NOTE — Telephone Encounter (Signed)
Returned call to patient.  Answered all questions on her typed list of questions.  List will be scanned into EPIC.  Advised to call for any needs, concerns, or questions.  Phone call lasting 25 minutes.

## 2016-05-31 ENCOUNTER — Ambulatory Visit (HOSPITAL_COMMUNITY): Payer: BLUE CROSS/BLUE SHIELD | Admitting: Certified Registered Nurse Anesthetist

## 2016-05-31 ENCOUNTER — Inpatient Hospital Stay (HOSPITAL_COMMUNITY)
Admission: AD | Admit: 2016-05-31 | Discharge: 2016-06-02 | DRG: 737 | Disposition: A | Discharge status: Home / Self Care | Payer: BLUE CROSS/BLUE SHIELD | Source: Ambulatory Visit | Attending: Gynecologic Oncology | Admitting: Gynecologic Oncology

## 2016-05-31 ENCOUNTER — Encounter (HOSPITAL_COMMUNITY)
Admission: AD | Disposition: A | Discharge status: Home / Self Care | Payer: Self-pay | Source: Ambulatory Visit | Attending: Gynecologic Oncology

## 2016-05-31 ENCOUNTER — Encounter (HOSPITAL_COMMUNITY): Payer: Self-pay | Admitting: *Deleted

## 2016-05-31 DIAGNOSIS — R1909 Other intra-abdominal and pelvic swelling, mass and lump: Secondary | ICD-10-CM | POA: Diagnosis not present

## 2016-05-31 DIAGNOSIS — D259 Leiomyoma of uterus, unspecified: Secondary | ICD-10-CM | POA: Diagnosis not present

## 2016-05-31 DIAGNOSIS — C561 Malignant neoplasm of right ovary: Secondary | ICD-10-CM | POA: Diagnosis not present

## 2016-05-31 DIAGNOSIS — Z79899 Other long term (current) drug therapy: Secondary | ICD-10-CM

## 2016-05-31 DIAGNOSIS — E785 Hyperlipidemia, unspecified: Secondary | ICD-10-CM | POA: Diagnosis present

## 2016-05-31 DIAGNOSIS — Z8543 Personal history of malignant neoplasm of ovary: Secondary | ICD-10-CM

## 2016-05-31 DIAGNOSIS — R1900 Intra-abdominal and pelvic swelling, mass and lump, unspecified site: Secondary | ICD-10-CM | POA: Diagnosis present

## 2016-05-31 DIAGNOSIS — K219 Gastro-esophageal reflux disease without esophagitis: Secondary | ICD-10-CM | POA: Diagnosis not present

## 2016-05-31 DIAGNOSIS — Z8249 Family history of ischemic heart disease and other diseases of the circulatory system: Secondary | ICD-10-CM

## 2016-05-31 DIAGNOSIS — Z780 Asymptomatic menopausal state: Secondary | ICD-10-CM | POA: Diagnosis not present

## 2016-05-31 DIAGNOSIS — R188 Other ascites: Secondary | ICD-10-CM | POA: Diagnosis not present

## 2016-05-31 DIAGNOSIS — Z87891 Personal history of nicotine dependence: Secondary | ICD-10-CM | POA: Diagnosis not present

## 2016-05-31 DIAGNOSIS — C569 Malignant neoplasm of unspecified ovary: Secondary | ICD-10-CM | POA: Diagnosis present

## 2016-05-31 DIAGNOSIS — K668 Other specified disorders of peritoneum: Secondary | ICD-10-CM | POA: Diagnosis not present

## 2016-05-31 HISTORY — PX: OMENTECTOMY: SHX5985

## 2016-05-31 HISTORY — PX: LAPAROTOMY: SHX154

## 2016-05-31 HISTORY — PX: ABDOMINAL HYSTERECTOMY: SHX81

## 2016-05-31 HISTORY — PX: SALPINGOOPHORECTOMY: SHX82

## 2016-05-31 LAB — TYPE AND SCREEN
ABO/RH(D): O POS
Antibody Screen: NEGATIVE

## 2016-05-31 SURGERY — LAPAROTOMY, EXPLORATORY
Anesthesia: General | Laterality: Right

## 2016-05-31 MED ORDER — SCOPOLAMINE 1 MG/3DAYS TD PT72
MEDICATED_PATCH | TRANSDERMAL | Status: AC
  Filled 2016-05-31: qty 1

## 2016-05-31 MED ORDER — OXYCODONE HCL 5 MG PO TABS
5.0000 mg | ORAL_TABLET | ORAL | Status: DC | PRN
  Administered 2016-05-31 (×3): 5 mg via ORAL
  Filled 2016-05-31 (×3): qty 1

## 2016-05-31 MED ORDER — DEXAMETHASONE SODIUM PHOSPHATE 10 MG/ML IJ SOLN
INTRAMUSCULAR | Status: DC | PRN
  Administered 2016-05-31: 10 mg via INTRAVENOUS

## 2016-05-31 MED ORDER — PROMETHAZINE HCL 25 MG/ML IJ SOLN
INTRAMUSCULAR | Status: AC
  Administered 2016-05-31: 6.25 mg via INTRAVENOUS
  Filled 2016-05-31: qty 1

## 2016-05-31 MED ORDER — ONDANSETRON HCL 4 MG/2ML IJ SOLN: 4.0000 mg | Freq: Four times a day (QID) | INTRAMUSCULAR | Status: DC | PRN

## 2016-05-31 MED ORDER — OXYCODONE HCL 5 MG PO TABS
5.0000 mg | ORAL_TABLET | Freq: Once | ORAL | Status: AC | PRN
  Administered 2016-05-31: 5 mg via ORAL

## 2016-05-31 MED ORDER — LACTATED RINGERS IV SOLN
INTRAVENOUS | Status: DC
  Administered 2016-05-31 (×3): via INTRAVENOUS

## 2016-05-31 MED ORDER — SENNOSIDES-DOCUSATE SODIUM 8.6-50 MG PO TABS
2.0000 | ORAL_TABLET | Freq: Every day | ORAL | Status: DC
  Administered 2016-05-31 – 2016-06-01 (×2): 2 via ORAL
  Filled 2016-05-31 (×2): qty 2

## 2016-05-31 MED ORDER — OXYCODONE HCL 5 MG PO TABS
ORAL_TABLET | ORAL | Status: AC
  Filled 2016-05-31: qty 1

## 2016-05-31 MED ORDER — BUPIVACAINE HCL (PF) 0.25 % IJ SOLN
INTRAMUSCULAR | Status: AC
Start: 2016-05-31 — End: 2016-05-31
  Filled 2016-05-31: qty 30

## 2016-05-31 MED ORDER — ONDANSETRON HCL 4 MG/2ML IJ SOLN
INTRAMUSCULAR | Status: AC
  Filled 2016-05-31: qty 2

## 2016-05-31 MED ORDER — FENTANYL CITRATE (PF) 100 MCG/2ML IJ SOLN
INTRAMUSCULAR | Status: AC
  Filled 2016-05-31: qty 2

## 2016-05-31 MED ORDER — GLYCOPYRROLATE 0.2 MG/ML IV SOSY
PREFILLED_SYRINGE | INTRAVENOUS | Status: DC | PRN
  Administered 2016-05-31: .2 mg via INTRAVENOUS

## 2016-05-31 MED ORDER — HYDROMORPHONE HCL 1 MG/ML IJ SOLN
INTRAMUSCULAR | Status: AC
  Administered 2016-05-31: 0.5 mg via INTRAVENOUS
  Filled 2016-05-31: qty 1

## 2016-05-31 MED ORDER — MIDAZOLAM HCL 5 MG/5ML IJ SOLN
INTRAMUSCULAR | Status: DC | PRN
  Administered 2016-05-31: 1 mg via INTRAVENOUS

## 2016-05-31 MED ORDER — BUPIVACAINE LIPOSOME 1.3 % IJ SUSP
20.0000 mL | Freq: Once | INTRAMUSCULAR | Status: AC
  Administered 2016-05-31: 20 mL
  Filled 2016-05-31: qty 20

## 2016-05-31 MED ORDER — ENSURE ENLIVE PO LIQD: 237.0000 mL | Freq: Two times a day (BID) | ORAL | Status: DC

## 2016-05-31 MED ORDER — ROCURONIUM BROMIDE 50 MG/5ML IV SOSY
PREFILLED_SYRINGE | INTRAVENOUS | Status: DC | PRN
  Administered 2016-05-31 (×2): 10 mg via INTRAVENOUS
  Administered 2016-05-31: 50 mg via INTRAVENOUS
  Administered 2016-05-31: 10 mg via INTRAVENOUS

## 2016-05-31 MED ORDER — HYDROMORPHONE HCL 1 MG/ML IJ SOLN
INTRAMUSCULAR | Status: DC | PRN
  Administered 2016-05-31 (×5): .4 mg via INTRAVENOUS

## 2016-05-31 MED ORDER — ROCURONIUM BROMIDE 50 MG/5ML IV SOSY
PREFILLED_SYRINGE | INTRAVENOUS | Status: AC
  Filled 2016-05-31: qty 5

## 2016-05-31 MED ORDER — HYDROMORPHONE HCL 2 MG/ML IJ SOLN
INTRAMUSCULAR | Status: AC
  Filled 2016-05-31: qty 1

## 2016-05-31 MED ORDER — ENOXAPARIN SODIUM 40 MG/0.4ML ~~LOC~~ SOLN
40.0000 mg | SUBCUTANEOUS | Status: DC
  Administered 2016-06-01 – 2016-06-02 (×2): 40 mg via SUBCUTANEOUS
  Filled 2016-05-31 (×2): qty 0.4

## 2016-05-31 MED ORDER — PROPOFOL 10 MG/ML IV BOLUS
INTRAVENOUS | Status: DC | PRN
  Administered 2016-05-31: 140 mg via INTRAVENOUS

## 2016-05-31 MED ORDER — KCL IN DEXTROSE-NACL 20-5-0.45 MEQ/L-%-% IV SOLN
INTRAVENOUS | Status: DC
  Administered 2016-05-31: 14:00:00 via INTRAVENOUS
  Filled 2016-05-31 (×2): qty 1000

## 2016-05-31 MED ORDER — DEXAMETHASONE SODIUM PHOSPHATE 10 MG/ML IJ SOLN
INTRAMUSCULAR | Status: AC
  Filled 2016-05-31: qty 1

## 2016-05-31 MED ORDER — GABAPENTIN 300 MG PO CAPS
600.0000 mg | ORAL_CAPSULE | Freq: Every day | ORAL | Status: AC
  Administered 2016-05-31 – 2016-06-01 (×2): 600 mg via ORAL
  Filled 2016-05-31 (×2): qty 2

## 2016-05-31 MED ORDER — LIDOCAINE 2% (20 MG/ML) 5 ML SYRINGE
INTRAMUSCULAR | Status: DC | PRN
  Administered 2016-05-31: 80 mg via INTRAVENOUS

## 2016-05-31 MED ORDER — KETOROLAC TROMETHAMINE 30 MG/ML IJ SOLN
15.0000 mg | Freq: Four times a day (QID) | INTRAMUSCULAR | Status: AC
  Administered 2016-05-31 – 2016-06-01 (×4): 15 mg via INTRAVENOUS
  Filled 2016-05-31 (×4): qty 1

## 2016-05-31 MED ORDER — ENOXAPARIN SODIUM 40 MG/0.4ML ~~LOC~~ SOLN
40.0000 mg | SUBCUTANEOUS | Status: AC
  Administered 2016-05-31: 40 mg via SUBCUTANEOUS
  Filled 2016-05-31: qty 0.4

## 2016-05-31 MED ORDER — SUGAMMADEX SODIUM 200 MG/2ML IV SOLN
INTRAVENOUS | Status: AC
  Filled 2016-05-31: qty 2

## 2016-05-31 MED ORDER — HYDROMORPHONE HCL 1 MG/ML IJ SOLN
0.2500 mg | INTRAMUSCULAR | Status: DC | PRN
  Administered 2016-05-31 (×4): 0.5 mg via INTRAVENOUS

## 2016-05-31 MED ORDER — IBUPROFEN 400 MG PO TABS
800.0000 mg | ORAL_TABLET | Freq: Four times a day (QID) | ORAL | Status: DC
  Administered 2016-06-01 – 2016-06-02 (×4): 800 mg via ORAL
  Filled 2016-05-31 (×4): qty 2

## 2016-05-31 MED ORDER — CEFAZOLIN SODIUM-DEXTROSE 2-4 GM/100ML-% IV SOLN
INTRAVENOUS | Status: AC
  Filled 2016-05-31: qty 100

## 2016-05-31 MED ORDER — KETOROLAC TROMETHAMINE 30 MG/ML IJ SOLN: 15.0000 mg | Freq: Four times a day (QID) | INTRAMUSCULAR | Status: AC

## 2016-05-31 MED ORDER — FENTANYL CITRATE (PF) 100 MCG/2ML IJ SOLN
INTRAMUSCULAR | Status: DC | PRN
  Administered 2016-05-31 (×6): 50 ug via INTRAVENOUS

## 2016-05-31 MED ORDER — SODIUM CHLORIDE 0.9 % IJ SOLN
INTRAMUSCULAR | Status: DC | PRN
  Administered 2016-05-31: 20 mL

## 2016-05-31 MED ORDER — SODIUM CHLORIDE 0.9 % IJ SOLN
INTRAMUSCULAR | Status: AC
  Filled 2016-05-31: qty 50

## 2016-05-31 MED ORDER — ONDANSETRON HCL 4 MG/2ML IJ SOLN
4.0000 mg | Freq: Once | INTRAMUSCULAR | Status: AC | PRN
  Administered 2016-05-31: 4 mg via INTRAVENOUS

## 2016-05-31 MED ORDER — PROPOFOL 10 MG/ML IV BOLUS
INTRAVENOUS | Status: AC
  Filled 2016-05-31: qty 20

## 2016-05-31 MED ORDER — BUPIVACAINE HCL 0.25 % IJ SOLN
INTRAMUSCULAR | Status: DC | PRN
  Administered 2016-05-31: 20 mL

## 2016-05-31 MED ORDER — CEFAZOLIN SODIUM-DEXTROSE 2-4 GM/100ML-% IV SOLN
2.0000 g | INTRAVENOUS | Status: AC
  Administered 2016-05-31: 2 g via INTRAVENOUS

## 2016-05-31 MED ORDER — SCOPOLAMINE 1 MG/3DAYS TD PT72
1.0000 | MEDICATED_PATCH | TRANSDERMAL | Status: DC
  Administered 2016-05-31: 1 via TRANSDERMAL
  Filled 2016-05-31: qty 1

## 2016-05-31 MED ORDER — ACETAMINOPHEN 500 MG PO TABS
1000.0000 mg | ORAL_TABLET | Freq: Four times a day (QID) | ORAL | Status: DC
  Administered 2016-05-31 – 2016-06-02 (×6): 1000 mg via ORAL
  Filled 2016-05-31 (×7): qty 2

## 2016-05-31 MED ORDER — 0.9 % SODIUM CHLORIDE (POUR BTL) OPTIME
TOPICAL | Status: DC | PRN
  Administered 2016-05-31: 2000 mL

## 2016-05-31 MED ORDER — HYDROMORPHONE HCL 1 MG/ML IJ SOLN: 0.5000 mg | INTRAMUSCULAR | Status: DC | PRN

## 2016-05-31 MED ORDER — MIDAZOLAM HCL 2 MG/2ML IJ SOLN
INTRAMUSCULAR | Status: AC
  Filled 2016-05-31: qty 2

## 2016-05-31 MED ORDER — SUCCINYLCHOLINE CHLORIDE 200 MG/10ML IV SOSY
PREFILLED_SYRINGE | INTRAVENOUS | Status: AC
  Filled 2016-05-31: qty 10

## 2016-05-31 MED ORDER — ONDANSETRON HCL 4 MG/2ML IJ SOLN
INTRAMUSCULAR | Status: DC | PRN
  Administered 2016-05-31: 4 mg via INTRAVENOUS

## 2016-05-31 MED ORDER — ONDANSETRON HCL 4 MG PO TABS
4.0000 mg | ORAL_TABLET | Freq: Four times a day (QID) | ORAL | Status: DC | PRN
  Administered 2016-05-31: 4 mg via ORAL
  Filled 2016-05-31: qty 1

## 2016-05-31 MED ORDER — LIDOCAINE 2% (20 MG/ML) 5 ML SYRINGE
INTRAMUSCULAR | Status: AC
  Filled 2016-05-31: qty 5

## 2016-05-31 MED ORDER — PROMETHAZINE HCL 25 MG/ML IJ SOLN
6.2500 mg | Freq: Once | INTRAMUSCULAR | Status: AC
  Administered 2016-05-31: 6.25 mg via INTRAVENOUS

## 2016-05-31 MED ORDER — OXYCODONE HCL 5 MG/5ML PO SOLN
5.0000 mg | Freq: Once | ORAL | Status: AC | PRN
  Filled 2016-05-31: qty 5

## 2016-05-31 MED ORDER — SUGAMMADEX SODIUM 200 MG/2ML IV SOLN
INTRAVENOUS | Status: DC | PRN
  Administered 2016-05-31: 200 mg via INTRAVENOUS

## 2016-05-31 SURGICAL SUPPLY — 69 items
ADH SKN CLS APL DERMABOND .7 (GAUZE/BANDAGES/DRESSINGS) ×4
AGENT HMST MTR 8 SURGIFLO (HEMOSTASIS)
ATTRACTOMAT 16X20 MAGNETIC DRP (DRAPES) ×5 IMPLANT
BLADE EXTENDED COATED 6.5IN (ELECTRODE) ×5 IMPLANT
CELLS DAT CNTRL 66122 CELL SVR (MISCELLANEOUS) ×4 IMPLANT
CHLORAPREP W/TINT 26ML (MISCELLANEOUS) ×5 IMPLANT
CLIP TI LARGE 6 (CLIP) ×4 IMPLANT
CLIP TI MEDIUM 6 (CLIP) ×5 IMPLANT
CLIP TI MEDIUM LARGE 6 (CLIP) ×5 IMPLANT
CONT SPEC 4OZ CLIKSEAL STRL BL (MISCELLANEOUS) ×5 IMPLANT
COVER SURGICAL LIGHT HANDLE (MISCELLANEOUS) ×5 IMPLANT
DERMABOND ADVANCED (GAUZE/BANDAGES/DRESSINGS) ×1
DERMABOND ADVANCED .7 DNX12 (GAUZE/BANDAGES/DRESSINGS) IMPLANT
DRAPE INCISE IOBAN 66X45 STRL (DRAPES) ×4 IMPLANT
DRAPE WARM FLUID 44X44 (DRAPE) ×5 IMPLANT
DRSG OPSITE POSTOP 4X10 (GAUZE/BANDAGES/DRESSINGS) ×1 IMPLANT
DRSG OPSITE POSTOP 4X12 (GAUZE/BANDAGES/DRESSINGS) IMPLANT
DRSG OPSITE POSTOP 4X8 (GAUZE/BANDAGES/DRESSINGS) IMPLANT
ELECT REM PT RETURN 9FT ADLT (ELECTROSURGICAL) ×5
ELECTRODE REM PT RTRN 9FT ADLT (ELECTROSURGICAL) ×4 IMPLANT
GAUZE SPONGE 4X4 12PLY STRL (GAUZE/BANDAGES/DRESSINGS) ×4 IMPLANT
GAUZE SPONGE 4X4 16PLY XRAY LF (GAUZE/BANDAGES/DRESSINGS) ×1 IMPLANT
GLOVE BIO SURGEON STRL SZ 6 (GLOVE) ×10 IMPLANT
GLOVE BIO SURGEON STRL SZ 6.5 (GLOVE) ×10 IMPLANT
GOWN STRL REUS W/ TWL LRG LVL3 (GOWN DISPOSABLE) ×8 IMPLANT
GOWN STRL REUS W/TWL LRG LVL3 (GOWN DISPOSABLE) ×10
HEMOSTAT ARISTA ABSORB 3G PWDR (MISCELLANEOUS) IMPLANT
KIT BASIN OR (CUSTOM PROCEDURE TRAY) ×5 IMPLANT
LIGASURE IMPACT 36 18CM CVD LR (INSTRUMENTS) ×1 IMPLANT
LOOP VESSEL MAXI BLUE (MISCELLANEOUS) IMPLANT
NEEDLE HYPO 22GX1.5 SAFETY (NEEDLE) ×10 IMPLANT
NS IRRIG 1000ML POUR BTL (IV SOLUTION) ×16 IMPLANT
PACK GENERAL/GYN (CUSTOM PROCEDURE TRAY) ×5 IMPLANT
RELOAD PROXIMATE 75MM BLUE (ENDOMECHANICALS) IMPLANT
RELOAD PROXIMATE TA60MM BLUE (ENDOMECHANICALS) IMPLANT
RELOAD STAPLE 60 BLU REG PROX (ENDOMECHANICALS) IMPLANT
RELOAD STAPLE 75 3.8 BLU REG (ENDOMECHANICALS) IMPLANT
RETRACTOR WND ALEXIS 18 MED (MISCELLANEOUS) IMPLANT
RETRACTOR WND ALEXIS 25 LRG (MISCELLANEOUS) IMPLANT
RTRCTR WOUND ALEXIS 18CM MED (MISCELLANEOUS) ×5
RTRCTR WOUND ALEXIS 25CM LRG (MISCELLANEOUS)
SEALER TISSUE X1 CVD JAW (INSTRUMENTS) IMPLANT
SHEET LAVH (DRAPES) ×5 IMPLANT
SPOGE SURGIFLO 8M (HEMOSTASIS)
SPONGE LAP 18X18 X RAY DECT (DISPOSABLE) ×3 IMPLANT
SPONGE SURGIFLO 8M (HEMOSTASIS) IMPLANT
STAPLER GUN LINEAR PROX 60 (STAPLE) IMPLANT
STAPLER PROXIMATE 75MM BLUE (STAPLE) IMPLANT
STAPLER VISISTAT 35W (STAPLE) IMPLANT
SUT MNCRL AB 4-0 PS2 18 (SUTURE) ×2 IMPLANT
SUT PDS AB 1 TP1 96 (SUTURE) ×10 IMPLANT
SUT PROLENE 5 0 CC 1 (SUTURE) IMPLANT
SUT SILK 3 0 SH CR/8 (SUTURE) ×4 IMPLANT
SUT VIC AB 0 CT1 36 (SUTURE) ×21 IMPLANT
SUT VIC AB 2-0 CT1 36 (SUTURE) ×10 IMPLANT
SUT VIC AB 2-0 CT2 27 (SUTURE) ×36 IMPLANT
SUT VIC AB 2-0 SH 27 (SUTURE) ×5
SUT VIC AB 2-0 SH 27X BRD (SUTURE) ×8 IMPLANT
SUT VIC AB 3-0 CTX 36 (SUTURE) IMPLANT
SUT VIC AB 3-0 SH 18 (SUTURE) IMPLANT
SUT VIC AB 3-0 SH 27 (SUTURE)
SUT VIC AB 3-0 SH 27X BRD (SUTURE) ×4 IMPLANT
SUT VICRYL 2 0 18  UND BR (SUTURE) ×1
SUT VICRYL 2 0 18 UND BR (SUTURE) ×4 IMPLANT
SYR 30ML LL (SYRINGE) ×9 IMPLANT
TOWEL OR 17X26 10 PK STRL BLUE (TOWEL DISPOSABLE) ×10 IMPLANT
TOWEL OR NON WOVEN STRL DISP B (DISPOSABLE) ×5 IMPLANT
TRAY FOLEY W/METER SILVER 16FR (SET/KITS/TRAYS/PACK) ×5 IMPLANT
UNDERPAD 30X30 INCONTINENT (UNDERPADS AND DIAPERS) ×5 IMPLANT

## 2016-05-31 NOTE — H&P (View-Only) (Signed)
Consult Note: Gyn-Onc  Consult was requested by Dr. Marisue Humble and Dr Laurann Montana for the evaluation of Theresa Frazier 60 y.o. female  CC:  Chief Complaint  Patient presents with  . pelvic mass    Assessment/Plan:  Theresa Frazier  is a 60 y.o.  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  I reviewed her [place] images from 05/04/16 with the patient.  I discussed that there are some concerning features on imaging (the solid components of the mass, its vascularity and the size), however there were also reassuring findings including how mobile it felt on exam and the lack of apparent extraovarian disease.  I recommend its removal with hysterectomy, BSO and possible staging pending frozen section results.  We have ordered CA 125 today and CT abdomen and pelvis to better evaluate the upper abdomen and the likelihood of malignancy and will determine route of surgery (open or robotic) based upon these results.  I discussed that if malignancy is identified, additional therapy (such as chemotherapy) is usually prescribed. I discussed surgical risks including  bleeding, infection, damage to internal organs (such as bladder,ureters, bowels), blood clot, reoperation and rehospitalization.  I discussed that we will better understand anticipated hospital stay and recovery when we know which surgical route is most appropriate.   HPI: Theresa Frazier is a 60 year old parous (P2) woman who is seen in consultation at the request of Dr Marisue Humble and Dr Laurann Montana for a complex cystic and solid pelvic mass.  The patient reported to her PCP's that she had abdominal bloating and fullness for 6-8 weeks since October 2017 when she saw them on 04/25/16. They performed a pelvic examination on that visit and appreciated a pelvic mass. A TVUS was ordered and performed on 05/04/16 and revealed a uterus measuring 8.4x3.7x4.8cm with a 7cm endometrial stripe (she has had no bleeding  symptoms) and the ovaries were not discretely seen. Instead, there is a large complex cystic and solid mass at midline, extending into the right and left adnexum with internal blood flow. The mass measures 11 x 7.4 x 14.1 cm. Separate ovaries are not identified.  A CT scan had been ordered but canceled upon the US findings and plan for consultation with me.  She is feeling fairly well. Her pants fit differently lately and she feels she may have lost weight. Her bladder and bowels are moving normally.  She denies pain.  She has no family history for breast/ovarian cancer or any other significant cancer history.  Her surgical history is significant for tonsillectomy only (no abdominal surgery).  Current Meds:  Outpatient Encounter Prescriptions as of 05/14/2016  Medication Sig  . BLACK CURRANT SEED OIL PO Take by mouth.  . Calcium-Magnesium-Vitamin D (CALCIUM MAGNESIUM PO) Take 3 tablets by mouth. 1000mg  calcium, 800 IU vitamin D, 500mg  magnesium  . Cholecalciferol (VITAMIN D) 400 UNIT/ML LIQD Take 4,000 Units by mouth daily.  Marland Kitchen Cod Liver Oil CAPS Take 2 capsules by mouth daily.  . Multiple Vitamins-Minerals (MULTIVITAMIN PO) Take 1 tablet by mouth daily.  . NON FORMULARY Ultra Emu Oil 3 gel caps daily  . omega-3 acid ethyl esters (LOVAZA) 1 G capsule Take 1 g by mouth 2 (two) times daily.  . rosuvastatin (CRESTOR) 5 MG tablet Take 1 tablet (5 mg total) by mouth daily. (Patient not taking: Reported on 05/14/2016)   No facility-administered encounter medications on file as of 05/14/2016.     Allergy:  No Known Allergies  Social Hx:   Social History   Social History  . Marital status: Married    Spouse name: N/A  . Number of children: N/A  . Years of education: N/A   Occupational History  . Not on file.   Social History Main Topics  . Smoking status: Former Smoker    Years: 7.00    Types: Cigarettes    Quit date: 11/07/1985  . Smokeless tobacco: Never Used  . Alcohol use No      Comment: occasional wine  . Drug use: No  . Sexual activity: Not on file   Other Topics Concern  . Not on file   Social History Narrative  . No narrative on file    Past Surgical Hx: History reviewed. No pertinent surgical history.  Past Medical Hx:  Past Medical History:  Diagnosis Date  . 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 Dyslipidemia   . Family history of early CAD   . GERD (gastroesophageal reflux disease)     Past Gynecological History:  SVD x 2 No LMP recorded. Patient is postmenopausal.  Family Hx: History reviewed. No pertinent family history.  Review of Systems:  Constitutional  Feels well,    ENT Normal appearing ears and nares bilaterally Skin/Breast  No rash, sores, jaundice, itching, dryness Cardiovascular  No chest pain, shortness of breath, or edema  Pulmonary  No cough or wheeze.  Gastro Intestinal  No nausea, vomitting, or diarrhoea. No bright red blood per rectum, no abdominal pain, change in bowel movement, or constipation.  Genito Urinary  No frequency, urgency, dysuria, no bleeding Musculo Skeletal  No myalgia, arthralgia, joint swelling or pain  Neurologic  No weakness, numbness, change in gait,  Psychology  No depression, anxiety, insomnia.   Vitals:  Blood pressure (!) 154/92, pulse 82, temperature 98.3 F (36.8 C), temperature source Oral, resp. rate 16, height 5\' 2"  (1.575 m), weight 165 lb 11.2 oz (75.2 kg), SpO2 99 %.  Physical Exam: WD in NAD Neck  Supple NROM, without any enlargements.  Lymph Node Survey No cervical supraclavicular or inguinal adenopathy Cardiovascular  Pulse normal rate, regularity and rhythm. S1 and S2 normal.  Lungs  Clear to auscultation bilateraly, without wheezes/crackles/rhonchi. Good air movement.  Skin  No rash/lesions/breakdown  Psychiatry  Alert and oriented to person, place, and time  Abdomen  Normoactive bowel sounds, abdomen soft, non-tender and nonobese  without evidence of hernia.  Back No CVA tenderness Genito Urinary  Vulva/vagina: Normal external female genitalia.  No lesions. No discharge or bleeding.  Bladder/urethra:  No lesions or masses, well supported bladder  Vagina: normal  Cervix: Normal appearing, no lesions.  Uterus:  Small, mobile, no parametrial involvement or nodularity.  Adnexa: palpable 10+cm firm, solid mass appreciated in the right pelvis. It is very mobile and non-tender. It is superior to the uterus.   Rectal  Good tone, no masses no cul de sac nodularity.  Extremities  No bilateral cyanosis, clubbing or edema.   Donaciano Eva, MD  05/14/2016, 11:50 AM

## 2016-05-31 NOTE — Op Note (Signed)
PATIENT: ODILE VELOSO DATE: 05/31/16   Preop Diagnosis: right ovarian mass  Postoperative Diagnosis: right ovarian mucinous carcinoma, clinical stage I  Surgery: Total abdominal hysterectomy, bilateral salpingo-oophorectomy, omentectomy, staging  Surgeons:  Everitt Amber, MD; Lahoma Crocker, MD   Anesthesia: General   Estimated blood loss: 150 ml  IVF: 3000 ml   Urine output: 621 ml   Complications: None   Pathology: Uterus, cervix, bilateral tubes and ovaries, omentum, ascites, right and left pelvic peritoneum, anterior pelvic peritoneum, right and left abdominal peritoneum.  Operative findings: 15cm solid and cystic right ovarian mass non-adherent removed unruptured. 100cc of mucinous fluid in the pelvis and cul de sac. Normal appearing uterus, cervix, bilateral tubes and ovaries. No gross extraovarian disease. Normal appearing retrocecal appendix with no palpable lesions. Small and large intestine palpably normal throughout.   Procedure: The patient was identified in the preoperative holding area. Informed consent was signed on the chart. Patient was seen history was reviewed and exam was performed.   The patient was then taken to the operating room and placed in the supine position with SCD hose on. General anesthesia was then induced without difficulty. She was then placed in the dorsolithotomy position. The abdomen was prepped with chlor prep sponges per protocol. Perineum was prepped with Betadine. The vagina was prepped with Betadine a Foley catheter was inserted into the bladder under sterile conditions.  The patient was then draped after the prep was dried. Timeout was performed the patient, procedure, antibiotic, allergy, and length of procedure. A vertical midline infraumbilical incision was and carried down to the underlying fascia using Bovie cautery. The fascia was scored in the fascial incision was extended superiorly and inferiorly using Bovie cautery.  The rectus bellies were dissected off the overlying fascia. The peritoneum was tented and entered. The peritoneal incision was extended superiorly and inferiorly with visualization of the underlying peritoneal cavity. The Buchwalter self-retaining retractor was then placed. At the initial placement as well as at several points during the case the lateral blades were checked to ensure no significant pressure on the psoas bellies.  The small and large bowel were packed out of the way of the surgical field with moist laparotomy sponges and malleable retractors were attached to the [place]. The mucinous ascites was aspirated and sent for pathology. The ovarian tumor on the right was elevated through the incision. The round ligament on the patient's right side was transected with monopolar cautery the anterior posterior leaves the broad ligament were opened. A window was made between the infundibulopelvic vessels and the ureter. The vessels were clamped x2 transected and suture ligated. The right utero-ovarian ligament was cross clamped and transected to liberate the right ovary and tube which was sent for frozen section which revealed mucinous carcinoma arising in a borderline tumor.   The bladder flap was created at this point and the uterine vessels on the right side were skeletonized. The uterine vessels were clamped with curved Masterson clamps transected and suture ligated. Our attention was turned to the left side were similar procedure was performed in that the round ligament was transected and the anterior and posterior leaves the broad ligament were opened. A window was made between the vessels and the ureter on the left and the vessels were clamped x2 transected and suture ligated. The uterine vessels were skeletonized clamped x2 transected and suture ligated. We continued down the cardinal ligaments with straight Masterson clamps the cervicovaginal junction was reached. We came across the vagina just  under the cervix with curved Masterson clamps. The cervix was amputated from the vagina. The angle pedicles as well as vaginal cuff were closed using figure-of-eight sutures of 0 Vicryl.  The omentum was delivered into the incision and the infracolic omentum was separated from its attachments to the transverse colon by creating skeletonized pedicles and use of ligasure with care to avoid the transverse colon serosa. Hemostasis was observed at all pedicles.  The peritoneum was biopsied with the bovie at the mid left and right abdominal peritoneal walls and left and right and anterior pelvis.  The pedicles were noted to be hemostatic. The abdomen pelvis were copiously irrigated. The retractor and laparotomy sponges were removed. The fascia was closed using running mass closure of #1 PDS. The subcutaneous tissues were irrigated and made hemostatic. 20 mL of Exparel within 20 mL of normal saline and 20cc of quarter percent marcaine was injected for postoperative pain control. The skin was closed using subcuticular sutures.  All instrument, suture, laparotomy, Ray-Tec, and needle counts were correct x2. The patient tolerated the procedure well and was taken recovery room in stable condition. This is Everitt Amber dictating an operative note on Theresa Frazier.

## 2016-05-31 NOTE — Anesthesia Postprocedure Evaluation (Signed)
Anesthesia Post Note  Patient: Theresa Frazier  Procedure(s) Performed: Procedure(s) (LRB): EXPLORATORY LAPAROTOMY (N/A) BILATERAL SALPINGO OOPHORECTOMY (Right) HYSTERECTOMY ABDOMINAL (Left) OMENTECTOMY AND STAGING FOR OVARIAN CANCER  Patient location during evaluation: PACU Anesthesia Type: General Level of consciousness: awake, awake and alert and oriented Pain management: pain level controlled Vital Signs Assessment: post-procedure vital signs reviewed and stable Respiratory status: spontaneous breathing, respiratory function stable and nonlabored ventilation Cardiovascular status: blood pressure returned to baseline Anesthetic complications: no       Last Vitals:  Vitals:   05/31/16 1233 05/31/16 1340  BP: (!) 143/86 (!) 144/77  Pulse: 97 91  Resp: 16 15  Temp: 36.3 C 36.5 C    Last Pain:  Vitals:   05/31/16 1340  TempSrc: Oral  PainSc:                  Yandell Mcjunkins COKER

## 2016-05-31 NOTE — Transfer of Care (Signed)
Immediate Anesthesia Transfer of Care Note  Patient: Theresa Frazier  Procedure(s) Performed: Procedure(s): EXPLORATORY LAPAROTOMY (N/A) BILATERAL SALPINGO OOPHORECTOMY (Right) HYSTERECTOMY ABDOMINAL (Left) OMENTECTOMY AND STAGING FOR OVARIAN CANCER  Patient Location: PACU  Anesthesia Type:General  Level of Consciousness:  sedated, patient cooperative and responds to stimulation  Airway & Oxygen Therapy:Patient Spontanous Breathing and Patient connected to face mask oxgen  Post-op Assessment:  Report given to PACU RN and Post -op Vital signs reviewed and stable  Post vital signs:  Reviewed and stable  Last Vitals:  Vitals:   05/31/16 0613  BP: 140/80  Pulse: 80  Resp: 16  Temp: 48.8 C    Complications: No apparent anesthesia complications

## 2016-05-31 NOTE — Anesthesia Preprocedure Evaluation (Signed)
Anesthesia Evaluation  Patient identified by MRN, date of birth, ID band Patient awake    Reviewed: Allergy & Precautions, NPO status , Patient's Chart, lab work & pertinent test results  Airway Mallampati: II  TM Distance: >3 FB Neck ROM: Full    Dental  (+) Teeth Intact, Dental Advisory Given   Pulmonary former smoker,    breath sounds clear to auscultation       Cardiovascular  Rhythm:Regular Rate:Normal     Neuro/Psych    GI/Hepatic   Endo/Other    Renal/GU      Musculoskeletal   Abdominal   Peds  Hematology   Anesthesia Other Findings   Reproductive/Obstetrics                             Anesthesia Physical Anesthesia Plan  ASA: II  Anesthesia Plan: General   Post-op Pain Management:    Induction: Intravenous  Airway Management Planned: Oral ETT  Additional Equipment:   Intra-op Plan:   Post-operative Plan: Extubation in OR  Informed Consent: I have reviewed the patients History and Physical, chart, labs and discussed the procedure including the risks, benefits and alternatives for the proposed anesthesia with the patient or authorized representative who has indicated his/her understanding and acceptance.   Dental advisory given  Plan Discussed with: CRNA and Anesthesiologist  Anesthesia Plan Comments:         Anesthesia Quick Evaluation

## 2016-05-31 NOTE — Interval H&P Note (Signed)
History and Physical Interval Note:  05/31/2016 7:16 AM  [name]  has presented today for surgery, with the diagnosis of PELVIC MASS  The various methods of treatment have been discussed with the patient and family. After consideration of risks, benefits and other options for treatment, the patient has consented to  Procedure(s): EXPLORATORY LAPAROTOMY (N/A) RIGHT SALPINGO OOPHORECTOMY, possible left SALPINGO OOPHORECTOMY, (Right) possible, HYSTERECTOMY ABDOMINAL (Left) POSSIBLE  STAGING (N/A) as a surgical intervention . The patient has determined that she would like to retain her uterus and contralateral ovary provided the pathology on frozen section is benign. She understands with a thickened endometrial lining there is a risk for occult uterine malignancy, however with an endometrial stripe of only 60mm in the settting of no bleeding she does not meet criteria for screening sampling.  The patient's history has been reviewed, patient examined, no change in status, stable for surgery.  I have reviewed the patient's chart and labs.  Questions were answered to the patient's satisfaction.    Theresa Frazier

## 2016-05-31 NOTE — Anesthesia Procedure Notes (Signed)
Procedure Name: Intubation Date/Time: 05/31/2016 7:31 AM Performed by: West Pugh Pre-anesthesia Checklist: Patient identified, Emergency Drugs available, Suction available, Patient being monitored and Timeout performed Patient Re-evaluated:Patient Re-evaluated prior to inductionOxygen Delivery Method: Circle system utilized Preoxygenation: Pre-oxygenation with 100% oxygen Intubation Type: IV induction Ventilation: Mask ventilation without difficulty Laryngoscope Size: Mac and 4 Grade View: Grade I Tube type: Oral Tube size: 7.0 mm Number of attempts: 1 Airway Equipment and Method: Stylet Placement Confirmation: ETT inserted through vocal cords under direct vision,  positive ETCO2,  CO2 detector and breath sounds checked- equal and bilateral Secured at: 21 cm Tube secured with: Tape Dental Injury: Teeth and Oropharynx as per pre-operative assessment

## 2016-05-31 NOTE — Discharge Instructions (Addendum)
05/31/2016  Return to work: 4-6 weeks if applicable  Activity: 1. Be up and out of the bed during the day.  Take a nap if needed.  You may walk up steps but be careful and use the hand rail.  Stair climbing will tire you more than you think, you may need to stop part way and rest.   2. No lifting or straining for 6 weeks.  3. No driving for 2 week(s).  Do not drive if you are taking narcotic pain medicine.  4. Shower daily.  Use soap and water on your incision and pat dry; don't rub.  No tub baths until cleared by your surgeon.   5. No sexual activity and nothing in the vagina for 6 weeks.  6. You may experience a small amount of clear drainage from your incision, which is normal.  If the drainage persists or increases, please call the office.  7. You may experience vaginal spotting after surgery or around the 6-8 week mark from surgery when the stitches at the top of the vagina begin to dissolve.  The spotting is normal but if you experience heavy bleeding, call our office.  Diet: 1. Low sodium Heart Healthy Diet is recommended.  2. It is safe to use a laxative, such as Miralax or Colace, if you have difficulty moving your bowels.   Wound Care: 1. Keep clean and dry.  Shower daily.  Reasons to call the Doctor:  Fever - Oral temperature greater than 100.4 degrees Fahrenheit  Foul-smelling vaginal discharge  Difficulty urinating  Nausea and vomiting  Increased pain at the site of the incision that is unrelieved with pain medicine.  Difficulty breathing with or without chest pain  New calf pain especially if only on one side  Sudden, continuing increased vaginal bleeding with or without clots.   Contacts: For questions or concerns you should contact:  Dr. Everitt Amber at [phone]  [name], NP at [phone]  After Hours: call [phone] and have the GYN Oncologist paged/contacted  Planning for Recovery and [place] to Gynecologic Surgery      In-Hospital Recovery Plan Team Caring for You After Surgery In addition to the nursing staff on the unit, the gynecological surgery team will care for you. This team is led by your surgeon and includes a resident in his last year of training, as well as other residents, medical students and a physician assistant or nurse practitioner. There will be a physician in the hospital 24 hours a day to tend to your needs. The residents and students report directly to your surgeon, who is the one overseeing all of your care.  Pain Relief After Surgery Your pain will be assessed regularly on a scale from 0 to 10. Pain assessment is  necessary to guide your pain relief. It is essential that you are able to take deep breaths, cough and move. Prevention or early treatment of pain is far more effective than trying to treat severe pain. Therefore, we have devised a specialized regimen to stay ahead of your pain and use almost no narcotics, which can slow down your recovery process. If you have an epidural catheter, you will receive a  constant infusion of pain medication through your epidural. If you need additional pain relief, you will be able to push a button to increase the medication in your epidural. You will also be given acetaminophen and an ibuprofen-like medication to keep your pain under control.  You can always ask for  additional pain pills if you are not comfortable. In most cases an anesthesiologist with expertise in pain management will visit you every day and help design your pain management plan.  One Day After Surgery Focus on drinking and walking. You will start drinking clear liquids after surgery. The intravenous fluids will be stopped, and the catheter may be removed  from your bladder. We expect you to get out of bed, with the nurses' or assistants' help, sit in a chair for six hours and start to move about in the hallways. You will also meet with a case manager to assess your  discharge needs, including home nursing. Your physician may order home care to assist with your transition home.  Home nursing visits, which are intermittent, help you get readjusted to home by teaching treatments, monitoring medications, and performing clinical assessment and reporting back to your physician. Other services may include therapy and medical equipment; private duty services are also available. If you are going "home" to a different address upon discharge, please alert [place]. A Home Care Coordinator can visit with you while in the hospital to discuss your options. If you have questions please speak with your case manager. If you need rehabilitation at a facility, a social worker will assist with this. If you need rehabilitation at a facility, a social worker will assist with this. If your procedure was performed in a minimally invasive fashion, you will be discharged to home if your pain is well controlled and you are tolerating a regular diet.     Two Days After Surgery You will start eating a soft diet and change to a more solid diet as you feel up to it. The catheter from your bladder will be removed, if not already done so. If there is a dressing on your wound, it will be removed. The tubing will be disconnected from your IV. We expect you to be out of bed for the majority of the day and walking at least three times in the hallway, with assistance as needed.  You may be discharged at this point if it is felt you are ready.   Three Days After Surgery You continue to eat your low residue diet. You may be ready to go home if you are drinking enough to keep yourself hydrated, your pain is well controlled, you are not belching or nauseated, you are passing gas and you are able to get around on your own. However, we will not discharge you from the hospital until we are sure you are ready.  Discharge Discharge time is at 10 a.m. You will need to make arrangements for someone  to accompany you home. You will not be released without someone present. Please keep in mind that we strive to get patients discharged as quickly as possible, but there may be delays for a variety of reasons. Complications That May Delay Discharge: ? Nausea and vomiting: It is very common to feel sick after your surgery. We give you medication to reduce this. However, if you do feel sick, you should reduce the amount you are taking by mouth. Small, frequent meals or drinks are best in this  situation. As long as you can drink and keep yourself hydrated, the nausea will likely pass.  Ileus: Following surgery, the bowel can be sluggish, making it difficult for food and gas to pass through the intestines. This is called an ileus. We have designed our care program to do everything possible to reduce the likelihood of an ileus.  If you do develop an ileus, it usually only lasts two to three days. However, it may require a small tube down the nose to decompress the stomach. The best way to avoid an ileus is to reduce the amount of narcotic pain medications, get up as much as possible after your surgery, and stimulate the bowel early after surgery with small amounts of food and liquids.  Wound infection: If a wound infection develops, this usually happens three to ten days after surgery.    Urinary retention: This is if you are unable to urinate after the catheter from your bladder is removed. The catheter may need to be reinserted until you are able to urinate on your own. This can be caused by anesthesia, pain medication and decreased activity.    When you are preparing to go home, you will receive:  Detailed discharge instructions, with information about your operation and medications    All prescriptions for medications you need at home; prescriptions can be filled while you are in the hospital if you would like    You may be prescribed Lovenox. Lovenox is used to reduce the risk  of developing a blood clot after surgery. An appointment to see your surgeon or provider one to two weeks after you leave the hospital for follow-up   After Discharge Once you are discharged: Call us at any time if you are worried about your recovery or if you should have any questions. During regular office hours, (8:30 a.m.-4:30 p.m.), and after hours call [phone].  Call us immediately if:  You have a fever higher than 100.4 degrees.   Your wound is red, more painful or has drainage.    You are nauseated, vomiting or can't keep liquids down.    Your pain is worse and not able to be controlled with the regimen you were sent home with.    If you are bleeding heavily or have a lot of fluid coming from your vagina. If you are on narcotics, the goal is to wean you off of them. If you are running low on supply and need more, call the nurse a few days before you will run out.  It is generally easier to reach someone between 8:30 a.m. - 4:30 p.m., so call early if you think something is not right. A nurse or nurse practitioner is available every day to answer your questions. After hours and on the weekends, the calls go to the resident doctors in the hospital. It may take longer for your phone call to be returned during this time. If you have a true emergency, such as severe abdominal pain, chest pain, shortness of breath or any other acute issues, call 911 and go to the local emergency room. Have them contact our team once you are stable.  Concerns After Discharge Bowel Function Following Your Surgery Your bowels will take several weeks to settle down and may be unpredictable at first. Your bowel movements may become loose, or you may be constipated. For the vast number of patients, this will get back to normal with time. Make sure you eat nutritious meals, drink plenty of fluids and take regular walks during the first two weeks after your operation. Your Guide to Gynecologic  Surgery    Abdominal Pain It is not unusual to suffer gripping pains (colic) during the first week following removal of a portion of your bowel. This pain usually lasts for a few minutes but goes away between spasms. If you have severe  pain lasting more than one to two hours or have a fever and feel generally unwell, you should contact us at  the telephone contact numbers listed at the end of this packet. Hysterectomy: You should have pelvic rest for six (6) weeks or as specified by your doctor after surgery. You should have nothing in the vagina (no tampons, douching, intercourse, etc.,) during this time period. If you have some vaginal spotting, this is normal. If you have heavy bleeding or  a lot of fluid from your vagina, this is NOT normal and you should contact your doctor's office or, if after hours, contact the doctor on call.  Diarrhea: Fiber and Imodium (Loperamide) The first step to improving your frequent or loose stools is to bulk up the stool with fiber. Metamucil is the most common type of fiber that is available at any drug store. Start with 1 teaspoon mixed into food, like yogurt or oatmeal, in the morning and evening. Try not to drink any fluid for one hour after you take the fiber. This will allow the fiber to act like a sponge in your intestines, soaking up all the excess water. Continue this for three to five days. You may increase by 1 teaspoon every three to five days until the desired affect, or you are at 1 tablespoon (3 teaspoons) twice a day. If this doesn't work, you may try over-the-counter Loperamide, which is an antidiarrheal medication. You may take one tablet in the morning and evening or 30 minutes before you typically have diarrhea. You may take up to eight of these tablets daily. It is best to discuss this with [place] prior to using this medication. If you have continuous diarrhea and abdominal cramping, call [phone].  Foley Catheter Your surgeon  may recommend you be discharged home with a foley catheter (bladder catheter) for 1 to 2 weeks. Typically this recommendation will be made for patients undergoing surgery to the lower urinary tract. Before you leave the hospital, your nurse should outfit you with a clip on the inner thigh to secure the catheter to prevent pulling as well as a small bag that can be easily worn on the upper leg under loose fitting pants and skirts. Your nurse will teach you how to exchange the large bag that typically comes with the catheter for the small bag. You may find it convenient to attach the small bag when active during the day and then the large bag when sleeping at night. If there is ever a point when you notice the catheter is not draining urine and youbegin to develop pain behind/above the pubic bone, you should report to the clinic or emergency room immediately as the catheter may be kinked or clogged. Kinking or clogging of the catheter prevents urine from draining from your bladder. Urine will quickly build up in the bladder and can cause severe pain as well as seriously disrupt healing if you have undergone surgery on the lower urinary tract. Additionally, pulling on the catheter can result in displacement of the balloon at the end of the catheter from inside of to outside  of the bladder. This also results in severe pain and can cause bleeding. For this reason, secure the catheter to the clip on your inner thigh at all times as the clip prevents against pulling.  Wound Care For the first few weeks following surgery, your wound may be slightly red and uncomfortable. You may shower and let the soapy water wash over your incision. Avoid soaking  in the tub for one month following surgery or until the wound is well healed. It will take the wound several months to "soften." It is common to have bumpy areas in the wound near the belly  button and at the ends of the incision.  If you have staples, these  should be removed when you are seen by your surgeon at the follow-up appointment. You may have a glue-like material on your incision. Do not pick at this. It will come off over time. It is the surgical glue used in surgery to close your incision. You also have sutures inside of you that will dissolve over time  Post-Surgery Diet Attention to good nutrition after surgery is important to your recovery. If you had no dietary restrictions prior to the surgery, you will have no special dietary restrictions after the surgery. However, consuming enough protein, calories, vitamins and minerals is necessary to support healing. Some patients find their appetite is less than normal after surgery. In this case, frequent small meals throughout the day may help. It is not uncommon to lose 10 to 15 pounds after surgery. However, by the fourth to fifth week, your weight loss should stabilize. It is normal that certain foods taste different and certain smells may make you nauseas. Over time, the amount you can comfortably consume will gradually increase. You should try to eat a balanced diet, which includes:  Foods that are soft, moist, and easy to chew and swallow    Canned or soft-cooked fruits and vegetables   Plenty of soft breads, rice, pasta, potatoes and other starchy foods (lowerfiber  varieties may be tolerated better initially)    High-protein foods and beverages, such as meats, eggs, milk, cottage cheese  or a supplemental nutrition drink like Boost or Ensure    Drink plenty of fluids-at least 8 to 10 cups per day. This includes water,  fruit juice, Gatorade, teas/coffee and milk. Drinking plenty is especially important if you have loose stools (diarrhea).   Avoid drinking a lot of caffeine, since this may dehydrate you.    Avoid fried, greasy and highly seasoned or spicy foods.    Avoid carbonated beverages in the first couple weeks.    Avoid raw fruits and vegetables.    Hobbies/Activities Walking is encouraged after your surgery. You should plan to undertake regular exercise several times a day and gradually increase this during the four weeks following your operation until you are back to your normal level of activity. You may climb stairs. Don't do any heavy lifting greater than 10 pounds or contact sports for the first month after your surgery. Generally, you can return to hobbies and activities soon after your surgery. This will help you recover. It can take up to two to three months to fully recover. It is not unusual to be fatigued and require an afternoon nap for up to six to eight weeks following surgery. Your body is using this energy to heal your wounds. Set small goals for yourself and try to do a little more each day.  Work It is normal to return to work three to six weeks following your operation. If your job involves heavy manual work, then you should wait six weeks. However, you should check with your employer regarding rules, which may be relevant to your return to work. If you need a return-to-work form for your employer or disability papers, bring them to your follow-up appointment or fax them to our office at [phone].  Driving  You may drive when you are off narcotics and pain-free enough to react quickly with your braking foot. For most patients, this occurs at one to four weeks following surgery.   Write down any questions you may have to ask your care team.  Important Contact Numbers: GYN Oncology Office: [phone]

## 2016-06-01 LAB — CBC
HCT: 33 % — ABNORMAL LOW (ref 36.0–46.0)
HEMOGLOBIN: 11 g/dL — AB (ref 12.0–15.0)
MCH: 31 pg (ref 26.0–34.0)
MCHC: 33.3 g/dL (ref 30.0–36.0)
MCV: 93 fL (ref 78.0–100.0)
Platelets: 237 10*3/uL (ref 150–400)
RBC: 3.55 MIL/uL — ABNORMAL LOW (ref 3.87–5.11)
RDW: 12.9 % (ref 11.5–15.5)
WBC: 10.1 10*3/uL (ref 4.0–10.5)

## 2016-06-01 LAB — BASIC METABOLIC PANEL
Anion gap: 4 — ABNORMAL LOW (ref 5–15)
BUN: 6 mg/dL (ref 6–20)
CALCIUM: 8.2 mg/dL — AB (ref 8.9–10.3)
CO2: 27 mmol/L (ref 22–32)
CREATININE: 0.66 mg/dL (ref 0.44–1.00)
Chloride: 106 mmol/L (ref 101–111)
GFR calc non Af Amer: >60 mL/min (ref >60)
Glucose, Bld: 131 mg/dL — ABNORMAL HIGH (ref 65–99)
Potassium: 4.2 mmol/L (ref 3.5–5.1)
SODIUM: 137 mmol/L (ref 135–145)

## 2016-06-01 MED ORDER — ENOXAPARIN (LOVENOX) PATIENT EDUCATION KIT
PACK | Freq: Once | Status: AC
  Administered 2016-06-02: 08:00:00
  Filled 2016-06-01: qty 1

## 2016-06-01 NOTE — Progress Notes (Signed)
Initial Nutrition Assessment  DOCUMENTATION CODES:   Not applicable  INTERVENTION:   Ensure Enlive po BID, each supplement provides 350 kcal and 20 grams of protein  NUTRITION DIAGNOSIS:   Inadequate oral intake related to poor appetite, cancer and cancer related treatments as evidenced by per patient/family report.  GOAL:   Patient will meet greater than or equal to 90% of their needs  MONITOR:   PO intake, Supplement acceptance  REASON FOR ASSESSMENT:   Malnutrition Screening Tool    ASSESSMENT:   60 y/o female s/p total abdominal hysterectomy, bilateral salpingo-oophorectomy, omentectomy, staging of mass  Met with pt in room today. Pt eating 75-85% meals. Pt reports poor appetite for 1 month pta. Pt reports that the mass in her abdomen was making her feel full. Pt reports wt loss of 14lbs(8%) since may. This is not severe but worth noting. Pt scheduled to start chemo in the near future. Talked to pt about the importance of adequate energy and protein intake during treatments.   Medications reviewed and include: lovenox, senokot  Labs reviewed: wnl  Nutrition-Focused physical exam completed. Findings are no fat depletion, no muscle depletion, and no edema.   Diet Order:  Diet regular Room service appropriate? Yes; Fluid consistency: Thin  Skin:  Wound (see comment) (abdominal incision )  Last BM:  12/27  Height:   Ht Readings from Last 1 Encounters:  05/31/16 5' 2"  (1.575 m)    Weight:   Wt Readings from Last 1 Encounters:  05/31/16 164 lb (74.4 kg)    Ideal Body Weight:  50 kg  BMI:  Body mass index is 30 kg/m.  Estimated Nutritional Needs:   Kcal:  1500-1700kcal/day   Protein:  82-97g/day   Fluid:  >1.5L/day   EDUCATION NEEDS:   No education needs identified at this time  Koleen Distance, RD, LDN Pager #[phone]

## 2016-06-01 NOTE — Progress Notes (Signed)
1 Day Post-Op Procedure(s) (LRB): EXPLORATORY LAPAROTOMY (N/A) BILATERAL SALPINGO OOPHORECTOMY (Right) HYSTERECTOMY ABDOMINAL (Left) OMENTECTOMY AND STAGING FOR OVARIAN CANCER  Subjective: Patient reports feeling well and relaxed this am.  Breakfast is on the way.  Nausea reported yesterday evening but has resolved this am.  No emesis reported.  Due to ambulate in the halls and due to void since foley removal.  Minimal pain reported.  All questions answered.  Patient voices multiple questions about chemotherapy.  Denies chest pain, dyspnea, passing flatus, or having a bowel movement.  No concerns voiced at the end of the visit.    Objective: Vital signs in last 24 hours: Temp:  [97.3 F (36.3 C)-98.8 F (37.1 C)] 98.7 F (37.1 C) (12/29 0534) Pulse Rate:  [63-97] 87 (12/29 0534) Resp:  [10-21] 15 (12/29 0534) BP: (113-147)/(56-86) 124/71 (12/29 0534) SpO2:  [95 %-100 %] 100 % (12/29 0534) Last BM Date: 05/30/16  Intake/Output from previous day: 12/28 0701 - 12/29 0700 In: 4335.8 [P.O.:460; I.V.:3875.8] Out: 2175 [Urine:1975; Blood:150]  Physical Examination: General: alert, cooperative and no distress Resp: clear to auscultation bilaterally Cardio: regular rate and rhythm, S1, S2 normal, no murmur, click, rub or gallop GI: soft, non-tender; bowel sounds normal; no masses,  no organomegaly and incision: midline incision with honeycomb dressing in place with no active drainage or erythema noted Extremities: extremities normal, atraumatic, no cyanosis or edema  Labs: WBC/Hgb/Hct/Plts:  10.1/11.0/33.0/237 (12/29 0503) BUN/Cr/glu/ALT/AST/amyl/lip:  6/0.66/--/--/--/--/-- (12/29 0503)  Assessment: 60 y.o. s/p Procedure(s): EXPLORATORY LAPAROTOMY BILATERAL SALPINGO OOPHORECTOMY HYSTERECTOMY ABDOMINAL OMENTECTOMY AND STAGING FOR OVARIAN CANCER: stable Pain:  Pain is well-controlled on PRN medications.  Heme: Hgb 11.0 and Hct 33.0 this am.  Stable post-operatively.  CV: BP and HR  stable post-operatively. Continue to monitor with ordered vital sign assessments.  GI:  Tolerating po: Yes.  Scopolamine patch in place behind right ear and antiemetics ordered PRN.  GU: Due to void since foley removal.    FEN: Stable post-operatively.  Prophylaxis: pharmacologic prophylaxis (with any of the following: enoxaparin (Lovenox) 45m SQ 2 hours prior to surgery then every day) and intermittent pneumatic compression boots.  Plan: Saline lock IV if breakfast tolerated Lovenox teaching kit for home use Abdominal binder to offer incisional support Encourage ambulation, IS use, deep breathing, and coughing Continue post-operative plan of care per Dr. JDelsa SaleWhen ready for discharge, the patient is to be discharged to home.   LOS: 1 day    Coye Dawood DEAL 06/01/2016, 9:03 AM

## 2016-06-02 MED ORDER — ENOXAPARIN SODIUM 40 MG/0.4ML ~~LOC~~ SOLN: 40.0000 mg | SUBCUTANEOUS | 0 refills | Status: DC

## 2016-06-02 MED ORDER — OXYCODONE-ACETAMINOPHEN 5-325 MG PO TABS: 1.0000 | ORAL_TABLET | ORAL | 0 refills | Status: DC | PRN

## 2016-06-02 NOTE — Care Management Note (Signed)
Case Management Note  Patient Details  Name: Theresa Frazier MRN: [id number] Date of Birth: Mar 21, 1956  Subjective/Objective:       s/p total abdominal hysterectomy             Action/Plan: Discharge Planning: Chart reviewed. No NCM needs identified.   PCP Kelton Pillar  Expected Discharge Date:  06/02/2016             Expected Discharge Plan:  Home/Self Care  In-House Referral:  NA  Discharge planning Services  CM Consult  Post Acute Care Choice:  NA Choice offered to:  NA  DME Arranged:  N/A (NA) DME Agency:  NA  HH Arranged:  NA HH Agency:  NA  Status of Service:  Completed, signed off  If discussed at [place] of Stay Meetings, dates discussed:    Additional Comments:  Erenest Rasher, RN 06/02/2016, 10:19 AM

## 2016-06-02 NOTE — Progress Notes (Signed)
RN reviewed discharge instructions with patient and family. All questions answered.   RN reviewed lovenox injections with patient, and patient successfully injected herself with th 8 am dose that she received this AM.  Paperwork and prescriptions given.   NT rolled patient down in wheelchair with all belongings to family car.

## 2016-06-02 NOTE — Discharge Summary (Signed)
Physician Discharge Summary  Patient ID: Theresa Frazier MRN: [id number] DOB/AGE: Sep 06, 1955 60 y.o.  Admit date: 05/31/2016 Discharge date: 06/02/2016  Admission Diagnoses: Ovarian cancer on right [facility])  Discharge Diagnoses:  Principal Problem:   Ovarian cancer on right [facility]) Active Problems:   Pelvic mass   Ovarian cancer [facility])   Discharged Condition: good  Hospital Course: On 05/31/2016, the patient underwent the following: Procedure(s): EXPLORATORY LAPAROTOMY BILATERAL SALPINGO OOPHORECTOMY HYSTERECTOMY ABDOMINAL OMENTECTOMY AND STAGING FOR OVARIAN CANCER.   The postoperative course was uneventful.  She was discharged to home on postoperative day 2 tolerating a regular diet.  Consults: None  Significant Diagnostic Studies: None  Treatments: surgery: see above  Discharge Exam: Blood pressure 114/65, pulse 78, temperature 98.6 F (37 C), temperature source Oral, resp. rate 18, height 5\' 2"  (1.575 m), weight 164 lb (74.4 kg), SpO2 94 %. General appearance: alert Resp: clear to auscultation bilaterally Cardio: regular rate and rhythm, S1, S2 normal, no murmur, click, rub or gallop GI: soft, non-tender; bowel sounds normal; no masses,  no organomegaly Extremities: extremities normal, atraumatic, no cyanosis or edema and Homans sign is negative, no sign of DVT Incision/Wound: C/D/I  Disposition: 01-Home or Self Care  Discharge Instructions    Call MD for:  extreme fatigue    Complete by:  As directed    Call MD for:  persistant dizziness or light-headedness    Complete by:  As directed    Call MD for:  persistant nausea and vomiting    Complete by:  As directed    Call MD for:  redness, tenderness, or signs of infection (pain, swelling, redness, odor or green/yellow discharge around incision site)    Complete by:  As directed    Call MD for:  severe uncontrolled pain    Complete by:  As directed    Call MD for:  temperature >100.4    Complete by:  As directed     Diet - low sodium heart healthy    Complete by:  As directed    Diet general    Complete by:  As directed    Discharge instructions    Complete by:  As directed    Return to work: 6 weeks  Activity: 1. Be up and out of the bed during the day.  Take a nap if needed.  You may walk up steps but be careful and use the hand rail.  Stair climbing will tire you more than you think, you may need to stop part way and rest.   2. No lifting or straining for 6 weeks.  3. No driving for 1-2 weeks.  Do Not drive if you are taking narcotic pain medicine.  4. Shower daily.  Use soap and water on your incision and pat dry; don't rub.   5. No sexual activity and nothing in the vagina for 4 weeks.  Diet: 1. Low sodium Heart Healthy Diet is recommended.  2. It is safe to use a laxative if you have difficulty moving your bowels.   Wound Care: 1. Keep clean and dry.  Shower daily.  Reasons to call the Doctor:  Fever - Oral temperature greater than 100.4 degrees Fahrenheit Foul-smelling vaginal discharge Difficulty urinating Nausea and vomiting Increased pain at the site of the incision that is unrelieved with pain medicine. Difficulty breathing with or without chest pain New calf pain especially if only on one side Sudden, continuing increased vaginal bleeding with or without clots.   Follow-up: 1. See  Theresa Frazier in 4 weeks.  Contacts: For questions or concerns you should contact:  Dr. Everitt Frazier at [phone]  or at [place]   Discharge wound care:    Complete by:  As directed    Keep clean and dry   Driving Restrictions    Complete by:  As directed    No driving for 1- 2 weeks   Increase activity slowly    Complete by:  As directed    Lifting restrictions    Complete by:  As directed    No lifting > 5 lbs for 6 weeks   May shower / Bathe    Complete by:  As directed    No tub baths for 6 weeks   Sexual Activity Restrictions    Complete by:  As directed    No  intercourse for 6 - 8 weeks     Allergies as of 06/02/2016   No Known Allergies     Medication List    STOP taking these medications   APPLE CIDER VINEGAR PO   BLACK CURRANT SEED OIL PO     TAKE these medications   enoxaparin 40 MG/0.4ML injection Commonly known as:  LOVENOX Inject 0.4 mLs (40 mg total) into the skin daily. Start taking on:  06/03/2016   hydroxypropyl methylcellulose / hypromellose 2.5 % ophthalmic solution Commonly known as:  ISOPTO TEARS / GONIOVISC Place 1-2 drops into both eyes 3 (three) times daily as needed for dry eyes.   multivitamin with minerals Tabs tablet Take 1 tablet by mouth daily.   NON FORMULARY Patient uses essential oils as needed   oxyCODONE-acetaminophen 5-325 MG tablet Commonly known as:  PERCOCET/ROXICET Take 1-2 tablets by mouth every 4 (four) hours as needed for severe pain.   vitamin C 1000 MG tablet Take 1,000 mg by mouth 2 (two) times daily.      Follow-up Information    Theresa Eva, MD Follow up on 06/15/2016.   Specialty:  Obstetrics and Gynecology Why:  at 9:30am at the [facility] for post-operative follow up Contact information: [place] [id number] [phone]           Signed: Agnes Frazier 06/02/2016, 11:58 AM

## 2016-06-05 ENCOUNTER — Telehealth: Payer: Self-pay | Admitting: Gynecologic Oncology

## 2016-06-05 NOTE — Telephone Encounter (Signed)
Patient called asking if she still had to take tylenol and aleve around the clock.  She states she feels great.  Bowels and bladder functioning without difficulty.  Minimal abd discomfort reported.  Final path discussed.  Advised that she could begin taking the tylenol or aleve on an as needed basis.  Final path released in mychart. Advised to call for any needs or concerns.

## 2016-06-08 ENCOUNTER — Telehealth: Payer: Self-pay | Admitting: Gynecologic Oncology

## 2016-06-08 NOTE — Telephone Encounter (Signed)
Returned call to patient.  Asking if she could do to a funeral this weekend in [place].  All questions answered.  Advised to call for any questions or concerns.  Follow up set for next week.

## 2016-06-11 ENCOUNTER — Telehealth: Payer: Self-pay

## 2016-06-11 NOTE — Telephone Encounter (Signed)
Issues discussed: 1: bill from insurance that stated her surgery was not preauthorized, pt feels that it is because she signed a 2nd consent form to include having her uterus removed on the day of surgery. RN left message for Darlenea, who, obtains pre authorizations, result pending, this issue can be revisited on Friday when Roshini has her post op appointment.  2: abd dressing removal, pt was instructed on how to remove honeycomb dressing, to leave area OTA when applicable, may cover with loose DSD if needed.  3: Pt c/o flank pain on right side. Denies any difficulty with urination or fever, minimal relief from heating pad, pt has NOT been using prescribed pain medication. Discussed the benefit of using at least tylenol. Pt verbalized understanding of information and will call us if problem worsens.

## 2016-06-14 NOTE — Progress Notes (Signed)
Follow-up Note: Gyn-Onc  Consult was requested by Dr. Marisue Humble and Dr Laurann Montana for the evaluation of Theresa Frazier 61 y.o. female  CC:  Chief Complaint  Patient presents with  . stage IA mucinous ovarian cancer    postop    Assessment/Plan:  Theresa Frazier  is a 61 y.o.  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   I discussed with the patient that in accordance with NCCN Guidelines it is reasonable to perform expectant/close follow-up and defer from chemotherapy.   I discussed the risk for recurrence associated with this disease. I discussed symptoms consistent with recurrence and for her to notify me of these should they occur.  I recommend 3 monthly evaluations with the next in April, 2017 with CEA and CA 125.  I recommend GI work-up with colonoscopy and EGD if not already done so in a recent time frame.  HPI: Theresa Frazier is a 61 year old parous (P2) woman who is seen in consultation at the request of Dr Marisue Humble and Dr Laurann Montana for a complex cystic and solid pelvic mass.  The patient reported to her PCP's that she had abdominal bloating and fullness for 6-8 weeks since October 2017 when she saw them on 04/25/16. They performed a pelvic examination on that visit and appreciated a pelvic mass. A TVUS was ordered and performed on 05/04/16 and revealed a uterus measuring 8.4x3.7x4.8cm with a 7cm endometrial stripe (she has had no bleeding symptoms) and the ovaries were not discretely seen. Instead, there is a large complex cystic and solid mass at midline, extending into the right and left adnexum with internal blood flow. The mass measures 11 x 7.4 x 14.1 cm. Separate ovaries are not identified.  A CT scan had been ordered but canceled upon the US findings and plan for consultation with me.  She is feeling fairly well. Her pants fit differently lately and she feels she may have lost weight. Her bladder and bowels are moving normally.  She denies  pain.  She has no family history for breast/ovarian cancer or any other significant cancer history.  Her surgical history is significant for tonsillectomy only (no abdominal surgery).  CT scan on 05/16/16 showed: large (15cm) complex mass in the central anatomic pelvis appears to arise from the right ovary and is highly worrisome for a cystic ovarian carcinoma and a small amount of pelvic free fluid. CA 125 on 05/14/16 was elevated at 72.  On 05/31/16 she went to the OR for an exploratory laparotomy, TAH, BSO, omentectomy and peritoneal biopsies (no lymphadenectomy due to frozen section showing mucinous cell type).Intraoperative findings were significant for a 15cm right ovarian mass non-adherent, and otherwise normal peritoneal cavity and appendix. Frozen showed mucinous adencarcinoma.  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  Interval Hx: Since surgery she has been feeling very well with no complaints.  Current Meds:  Outpatient Encounter Prescriptions as of 06/15/2016  Medication Sig  . acetaminophen (TYLENOL) 500 MG chewable tablet Chew 500 mg by mouth every 6 (six) hours as needed for pain.  . Ascorbic Acid (VITAMIN C) 1000 MG tablet Take 1,000 mg by mouth 2 (two) times daily.  Marland Kitchen enoxaparin (LOVENOX) 40 MG/0.4ML injection Inject 0.4 mLs (40 mg total) into the skin daily.  . hydroxypropyl methylcellulose / hypromellose (ISOPTO TEARS / GONIOVISC) 2.5 % ophthalmic solution Place 1-2 drops into both eyes 3 (three) times  daily as needed for dry eyes.  Marland Kitchen ibuprofen (ADVIL,MOTRIN) 200 MG tablet Take 200 mg by mouth every 6 (six) hours as needed.  . Multiple Vitamin (MULTIVITAMIN WITH MINERALS) TABS tablet Take 1 tablet by mouth daily.  . NON FORMULARY Patient uses essential oils as needed  . [DISCONTINUED] oxyCODONE-acetaminophen (PERCOCET/ROXICET) 5-325 MG tablet Take 1-2 tablets by mouth every  4 (four) hours as needed for severe pain. (Patient not taking: Reported on 06/15/2016)   No facility-administered encounter medications on file as of 06/15/2016.     Allergy: No Known Allergies  Social Hx:   Social History   Social History  . Marital status: Married    Spouse name: N/A  . Number of children: N/A  . Years of education: N/A   Occupational History  . Not on file.   Social History Main Topics  . Smoking status: Former Smoker    Years: 7.00    Types: Cigarettes    Quit date: 11/07/1985  . Smokeless tobacco: Never Used  . Alcohol use No  . Drug use: No  . Sexual activity: Not on file   Other Topics Concern  . Not on file   Social History Narrative  . No narrative on file    Past Surgical Hx:  Past Surgical History:  Procedure Laterality Date  . ABDOMINAL HYSTERECTOMY Left 05/31/2016   Procedure: HYSTERECTOMY ABDOMINAL;  Surgeon: Everitt Amber, MD;  Location: [facility];  Service: Gynecology;  Laterality: Left;  . BREAST SURGERY  1993   left breast-calcification  . COLONOSCOPY    . LAPAROTOMY N/A 05/31/2016   Procedure: EXPLORATORY LAPAROTOMY;  Surgeon: Everitt Amber, MD;  Location: [facility];  Service: Gynecology;  Laterality: N/A;  . OMENTECTOMY  05/31/2016   Procedure: OMENTECTOMY AND STAGING FOR OVARIAN CANCER;  Surgeon: Everitt Amber, MD;  Location: [facility];  Service: Gynecology;;  . SALPINGOOPHORECTOMY Right 05/31/2016   Procedure: BILATERAL SALPINGO OOPHORECTOMY;  Surgeon: Everitt Amber, MD;  Location: [facility];  Service: Gynecology;  Laterality: Right;  . TONSILLECTOMY      Past Medical Hx:  Past Medical History:  Diagnosis Date  . 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 Dyslipidemia   . Family history of early CAD   . GERD (gastroesophageal reflux disease)     Past Gynecological History:  SVD x 2 No LMP recorded. Patient is postmenopausal.  Family Hx: History reviewed. No pertinent family history.  Review of Systems:  Constitutional   Feels well,    ENT Normal appearing ears and nares bilaterally Skin/Breast  No rash, sores, jaundice, itching, dryness Cardiovascular  No chest pain, shortness of breath, or edema  Pulmonary  No cough or wheeze.  Gastro Intestinal  No nausea, vomitting, or diarrhoea. No bright red blood per rectum, no abdominal pain, change in bowel movement, or constipation.  Genito Urinary  No frequency, urgency, dysuria, no bleeding Musculo Skeletal  No myalgia, arthralgia, joint swelling or pain  Neurologic  No weakness, numbness, change in gait,  Psychology  No depression, anxiety, insomnia.   Vitals:  Blood pressure (!) 144/77, pulse 77, temperature 98.1 F (36.7 C), temperature source Oral, resp. rate 18, height 5\' 2"  (1.575 m), weight 160 lb 8 oz (72.8 kg), SpO2 99 %.  Physical Exam: WD in NAD Neck  Supple NROM, without any enlargements.  Lymph Node Survey No cervical supraclavicular or inguinal adenopathy Cardiovascular  Pulse normal rate, regularity and rhythm. S1 and S2 normal.  Lungs  Clear to  auscultation bilateraly, without wheezes/crackles/rhonchi. Good air movement.  Skin  No rash/lesions/breakdown  Psychiatry  Alert and oriented to person, place, and time  Abdomen  Normoactive bowel sounds, abdomen soft, non-tender and nonobese without evidence of hernia. Incision is well healed without signs of infection or induration.. Back No CVA tenderness Genito Urinary: normal vaginal cuff healing and in tact. No blood or discharge. Rectal  deferred  Extremities  No bilateral cyanosis, clubbing or edema.   30 minutes of direct face to face counseling time was spent with the patient. This included discussion about prognosis, therapy recommendations and postoperative side effects and are beyond the scope of routine postoperative care.    Donaciano Eva, MD  06/19/2016, 5:29 PM

## 2016-06-15 ENCOUNTER — Encounter: Payer: Self-pay | Admitting: Gynecologic Oncology

## 2016-06-15 ENCOUNTER — Ambulatory Visit: Payer: BLUE CROSS/BLUE SHIELD | Attending: Gynecologic Oncology | Admitting: Gynecologic Oncology

## 2016-06-15 VITALS — BP 144/77 | HR 77 | Temp 98.1°F | Resp 18 | Ht 62.0 in | Wt 160.5 lb

## 2016-06-15 DIAGNOSIS — K219 Gastro-esophageal reflux disease without esophagitis: Secondary | ICD-10-CM | POA: Insufficient documentation

## 2016-06-15 DIAGNOSIS — Z87891 Personal history of nicotine dependence: Secondary | ICD-10-CM | POA: Diagnosis not present

## 2016-06-15 DIAGNOSIS — Z8249 Family history of ischemic heart disease and other diseases of the circulatory system: Secondary | ICD-10-CM | POA: Diagnosis not present

## 2016-06-15 DIAGNOSIS — E785 Hyperlipidemia, unspecified: Secondary | ICD-10-CM | POA: Insufficient documentation

## 2016-06-15 DIAGNOSIS — Z9889 Other specified postprocedural states: Secondary | ICD-10-CM | POA: Diagnosis not present

## 2016-06-15 DIAGNOSIS — C561 Malignant neoplasm of right ovary: Secondary | ICD-10-CM

## 2016-06-19 ENCOUNTER — Encounter: Payer: Self-pay | Admitting: Gynecologic Oncology

## 2016-06-25 ENCOUNTER — Encounter: Payer: Self-pay | Admitting: Gynecologic Oncology

## 2016-06-25 NOTE — Progress Notes (Signed)
Gynecologic Oncology Multi-Disciplinary Disposition Conference Note  Date of the Conference: June 25, 2016  Patient Name: Theresa Frazier  Referring Provider: Dr. Marisue Humble, Dr. Laurann Montana Primary GYN Oncologist: Dr. Everitt Amber  Stage/Disposition:  Stage IA mucinous carcinoma of the right ovary.  Disposition is to close surveillance.   This Multidisciplinary conference took place involving physicians from [place], [place], Radiation Oncology, Pathology, Radiology along with the Gynecologic Oncology Nurse Practitioner and RN.  Comprehensive assessment of the patient's malignancy, staging, need for surgery, chemotherapy, radiation therapy, and need for further testing were reviewed. Supportive measures, both inpatient and following discharge were also discussed. The recommended plan of care is documented. Greater than 35 minutes were spent correlating and coordinating this patient's care.

## 2016-06-26 ENCOUNTER — Telehealth: Payer: Self-pay | Admitting: Gynecologic Oncology

## 2016-06-26 NOTE — Telephone Encounter (Signed)
Returned call to the patient.  She had concerns about her surgery from December.  She had reached out to her insurance today and they continue to state that her surgery was denied because pre-certification was not obtained.  I left a message with Darlena in prior auth about the issue.  According to the surgery schedule, it states pre-cert was obtained.  Will continue to follow.

## 2016-08-10 ENCOUNTER — Other Ambulatory Visit: Payer: Self-pay | Admitting: *Deleted

## 2016-08-10 DIAGNOSIS — C561 Malignant neoplasm of right ovary: Secondary | ICD-10-CM

## 2016-08-14 ENCOUNTER — Telehealth: Payer: Self-pay | Admitting: *Deleted

## 2016-08-14 NOTE — Telephone Encounter (Signed)
Pt reported bleeding with sexual activity this morning. Denies pain. No bleeding at this time  Instructed per Dr Denman George to call for follow up appt if not better. S

## 2016-08-27 ENCOUNTER — Telehealth: Payer: Self-pay | Admitting: Genetics

## 2016-08-27 ENCOUNTER — Encounter: Payer: Self-pay | Admitting: Genetics

## 2016-08-27 NOTE — Telephone Encounter (Signed)
Appt has been scheduled for the pt to see Vicente Males for genetic counseling on 4/10 at 9am. Pt aware to arrive 15 minutes early. Voiced understanding. Letter mailed.

## 2016-09-11 ENCOUNTER — Encounter: Payer: Self-pay | Admitting: Genetics

## 2016-09-11 ENCOUNTER — Other Ambulatory Visit: Payer: BLUE CROSS/BLUE SHIELD

## 2016-09-11 ENCOUNTER — Ambulatory Visit (HOSPITAL_BASED_OUTPATIENT_CLINIC_OR_DEPARTMENT_OTHER): Payer: BLUE CROSS/BLUE SHIELD | Admitting: Genetics

## 2016-09-11 DIAGNOSIS — C561 Malignant neoplasm of right ovary: Secondary | ICD-10-CM | POA: Diagnosis not present

## 2016-09-11 DIAGNOSIS — Z800 Family history of malignant neoplasm of digestive organs: Secondary | ICD-10-CM

## 2016-09-11 NOTE — Progress Notes (Signed)
REFERRING PROVIDER: Kelton Pillar, MD [address] [place], [place] [address]  PRIMARY PROVIDER:  Osborne Casco, MD  PRIMARY REASON FOR VISIT:  1. Malignant neoplasm of right ovary (HCC)      HISTORY OF PRESENT ILLNESS:   Theresa Frazier, a 61 y.o. female, was seen for a [place] cancer genetics consultation at the request of Dr. Laurann Montana due to a personal history of cancer.  Theresa Frazier presents to clinic today to discuss the possibility of a hereditary predisposition to cancer, genetic testing, and to further clarify her future cancer risks, as well as potential cancer risks for family members.   In December 2017, at the age of 44, [name] was diagnosed with mucinous carcinoma of the right ovary. This was treated with surgery and close surveillance.  CANCER HISTORY:   No history exists.   CANCER SCREENINGS: Last mammogram 12/2015 - BIRADS 1 Last colonoscopy 2017 per pt report - no polyps  Past Medical History:  Diagnosis Date  . 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 Dyslipidemia   . Family history of early CAD   . GERD (gastroesophageal reflux disease)     Past Surgical History:  Procedure Laterality Date  . ABDOMINAL HYSTERECTOMY Left 05/31/2016   Procedure: HYSTERECTOMY ABDOMINAL;  Surgeon: Everitt Amber, MD;  Location: [facility];  Service: Gynecology;  Laterality: Left;  . BREAST SURGERY  1993   left breast-calcification  . COLONOSCOPY    . LAPAROTOMY N/A 05/31/2016   Procedure: EXPLORATORY LAPAROTOMY;  Surgeon: Everitt Amber, MD;  Location: [facility];  Service: Gynecology;  Laterality: N/A;  . OMENTECTOMY  05/31/2016   Procedure: OMENTECTOMY AND STAGING FOR OVARIAN CANCER;  Surgeon: Everitt Amber, MD;  Location: [facility];  Service: Gynecology;;  . SALPINGOOPHORECTOMY Right 05/31/2016   Procedure: BILATERAL SALPINGO OOPHORECTOMY;  Surgeon: Everitt Amber, MD;  Location: [facility];  Service: Gynecology;  Laterality: Right;  . TONSILLECTOMY       Social History   Social History  . Marital status: Married    Spouse name: N/A  . Number of children: N/A  . Years of education: N/A   Social History Main Topics  . Smoking status: Former Smoker    Years: 7.00    Types: Cigarettes    Quit date: 11/07/1985  . Smokeless tobacco: Never Used  . Alcohol use No  . Drug use: No  . Sexual activity: Not on file   Other Topics Concern  . Not on file   Social History Narrative  . No narrative on file     FAMILY HISTORY:  We obtained a detailed, 4-generation family history.  Significant diagnoses are listed below: Family History  Problem Relation Age of Onset  . Pancreatic cancer Mother 31    d.73 - two months after diagnosis  . Addison's disease Maternal Aunt 76  . Leukemia Maternal Uncle 52    d.59 - shortly after diagnosis  . Melanoma Daughter 89   Theresa Frazier has a son, age 56, and a daughter, age 3. Theresa Frazier daughter was diagnosed with melanoma (arm) at age 41. Her daughter is regularly seen by a dermatologist and routinely has abnormal moles removed, though only one has been melanoma. Her son is without cancers. Theresa Frazier has a brother,a ge 33, and 2 sisters, ages 27 and 61. None of her siblings have had cancer. Both of her sisters underwent TAH/BSO prior to menopause (87s and 77s).  Theresa Frazier mother was diagnosed  with pancreatic cancer at age 65 and died 37 months later at age 5. Her mother underwent TAH/BSO in her 16s. Theresa Frazier mother had 2 sisters and a brother. One sister is alive without cancers at age 58. The other sister died at 73 without cancers. The brother was diagnosed with leukemia at age 62 and died shortly after. Theresa Frazier maternal grandmother died at the age of 88 with suspected Creutzfeldt-Jakob disease, though it was never confirmed. Her maternal grandfather died at 13 from a stroke. This grandfather had a brother with an unspecified form of cancer and died in his 78s.  Theresa Frazier father  is living at age 56 without cancers, though he does have heart problems. He is an only child. Theresa Frazier paternal grandfather died at 98 without cancer. He had a brother with prostate cancer and another brother with an unspecified form of cancer. Theresa Frazier paternal grandmother died at 66 without cancers. One of her sisters died in her 56s with either breast or bone cancer.  Theresa Frazier is unaware of previous family history of genetic testing for hereditary cancer risks. Patient's maternal ancestors are of [place] and [place] descent, and paternal ancestors are of Caucasian descent. There is no reported Ashkenazi Jewish ancestry. There is no known consanguinity.  GENETIC COUNSELING ASSESSMENT: Theresa Frazier is a 61 y.o. female with a personal history of ovarian cancer and family history of pancreatic cancer and melanoma which is somewhat suggestive of a hereditary cancer syndrome and predisposition to cancer. We, therefore, discussed and recommended the following at today's visit.   DISCUSSION: We reviewed the characteristics, features and inheritance patterns of hereditary cancer syndromes. We also discussed genetic testing, including the appropriate family members to test, the process of testing, insurance coverage and turn-around-time for results. We discussed the implications of a negative, positive and/or variant of uncertain significant result. We recommended [name] pursue genetic testing for the 83-gene Multi-Cancer Panel offered by [facility].   Based on [name]'s personal and family history of cancer, she meets medical criteria for genetic testing. Despite that she meets criteria, she may still have an out of pocket cost. We discussed that if her out of pocket cost for testing is over $100, the laboratory will call and confirm whether she wants to proceed with testing.  If the out of pocket cost of testing is less than $100 she will be billed by the genetic testing laboratory. We also  discussed [name]'s option to pursue genetic testing through [facility]'s self-pay option of $250. [name] elected to pursue testing through this self-pay option.  PLAN: After considering the risks, benefits, and limitations, Ms. Nanninga  provided informed consent to pursue genetic testing and the blood sample was sent to [facility] for analysis of the 83-gene Multi-Cancer Panel. Results should be available within approximately 3 weeks' time, at which point they will be disclosed by telephone to Ms. Henion, as will any additional recommendations warranted by these results. Ms. Kellogg will receive a summary of her genetic counseling visit and a copy of her results once available. This information will also be available in Epic.   Based on discussion of risk to Ms. Canova's children, including abbreviated discussion of Ms. Yin's husband's family history, we recommended her husband consider genetic counseling and testing since his sister had colon cancer prior to age 49. Though her husband's family history does not impact Ms. Virella's cancer risks, part of our discussion focused on risk to Ms. Jarrard's children, both from  their maternal and paternal families. Ms. Felker will let us know if we can be of any assistance in coordinating genetic counseling and/or testing for her husband.   Lastly, we encouraged Ms. Antwine to remain in contact with cancer genetics annually so that we can continuously update the family history and inform her of any changes in cancer genetics and testing that may be of benefit for this family.   Ms.  [place] questions were answered to her satisfaction today. Our contact information was provided should additional questions or concerns arise. Thank you for the referral and allowing [place] to share in the care of your patient.   Mal Misty, MS, [facility] Certified Naval [email or web address] phone: [phone]  The patient was seen for a total of  60 minutes in face-to-face genetic counseling.   _______________________________________________________________________ For Office Staff:  Number of people involved in session: 1 Was an Intern/ student involved with case: no

## 2016-09-28 ENCOUNTER — Telehealth: Payer: Self-pay | Admitting: Genetics

## 2016-09-28 ENCOUNTER — Ambulatory Visit: Payer: Self-pay | Admitting: Genetics

## 2016-09-28 ENCOUNTER — Encounter: Payer: Self-pay | Admitting: Genetics

## 2016-09-28 DIAGNOSIS — Z1379 Encounter for other screening for genetic and chromosomal anomalies: Secondary | ICD-10-CM

## 2016-09-28 HISTORY — DX: Encounter for other screening for genetic and chromosomal anomalies: Z13.79

## 2016-09-28 NOTE — Telephone Encounter (Signed)
AMENDMENT: Genetic testing was performed through [facility]'s 83-gene Multi-Cancer Panel. [facility]'s Multi-Cancer Panel includes analysis of the following 83 genes: ALK, APC, ATM, AXIN2, BAP1, BARD1, BLM, BMPR1A, BRCA1, BRCA2, BRIP1, CASR, CDC73, CDH1, CDK4, CDKN1B, CDKN1C, CDKN2A, CEBPA, CHEK2, CTNNA1, DICER1, DIS3L2, EGFR, EPCAM, FH, FLCN, GATA2, GPC3, GREM1, HOXB13, HRAS, KIT, MAX, MEN1, MET, MITF, MLH1, MSH2, MSH3, MSH6, MUTYH, NBN, NF1, NF2, NTHL1, PALB2, PDGFRA, PHOX2B, PMS2, POLD1, POLE, POT1, PRKAR1A, PTCH1, PTEN, RAD50, RAD51C, RAD51D, RB1, RECQL4, RET, RUNX1, SDHA, SDHAF2, SDHB, SDHC, SDHD, SMAD4, SMARCA4, SMARCB1, SMARCE1, STK11, SUFU, TERC, TERT, TMEM127, TP53, TSC1, TSC2, VHL, WRN, WT1.

## 2016-09-28 NOTE — Telephone Encounter (Signed)
Reviewed that germline genetic testing revealed ONE pathogenic mutation in [place]. Though a pathogenic mutation was identified, increased risk for cancer is only associated with TWO (biallelic) pathogenic mutations. No additional mutations or variants of uncertain significance were identified. Testing was performed through [facility]'s 46-gene Common Hereditary Cancers Panel. [facility] Common Hereditary Cancers Panel includes analysis of the following 46 genes: APC, ATM, AXIN2, BARD1, BMPR1A, BRCA1, BRCA2, BRIP1, CDH1, CDKN2A, CHEK2, CTNNA1, DICER1, EPCAM, GREM1, HOXB13, KIT, MEN1, MLH1, MSH2, MSH3, MSH6, MUTYH, NBN, NF1, NTHL1, PALB2, PDGFRA, PMS2, POLD1, POLE, PTEN, RAD50, RAD51C, RAD51D, SDHA, SDHB, SDHC, SDHD, SMAD4, SMARCA4, STK11, TP53, TSC1, TSC2, and VHL.  For more detailed discussion, please see genetic counseling documentation from 09/28/2016. Result report dated 09/24/2016.

## 2016-09-28 NOTE — Telephone Encounter (Signed)
Error

## 2016-09-28 NOTE — Telephone Encounter (Deleted)
-----   Message from Mal Misty sent at 09/11/2016 12:54 PM EDT ----- Regarding: Call Results [name] did not tell family members she is coming for genetic counseling and testing. She is unsure how/when to discuss a positive result with husband and daughter, but has confidence she will figure it out. Route notes to Dr. Denman George. Patient may want written documentation of genetic testing and counseling.

## 2016-09-28 NOTE — Progress Notes (Signed)
HPI: Theresa Frazier was previously seen in the [facility] clinic due to a personal and family history of cancer and concerns regarding a hereditary predisposition to cancer. Please refer to our prior cancer genetics clinic note for more information regarding Theresa Frazier medical, social and family histories, and our assessment and recommendations, at the time. Theresa Frazier recent genetic test results were disclosed to her, as were recommendations warranted by these results. These results and recommendations are discussed in more detail below.  CANCER HISTORY: In December 2017, at the age of 61, Theresa Frazier was diagnosed with mucinous carcinoma of the right ovary. This was treated with surgery and close surveillance.   FAMILY HISTORY:  We obtained a detailed, 4-generation family history.  Significant diagnoses are listed below: Family History  Problem Relation Age of Onset  . Pancreatic cancer Mother 63    d.73 - two months after diagnosis  . Addison's disease Maternal Aunt 76  . Leukemia Maternal Uncle 28    d.59 - shortly after diagnosis  . Melanoma Daughter 59   Theresa Frazier has a son, age 61, and a daughter, age 73. Theresa Frazier daughter was diagnosed with melanoma (arm) at age 57. Her daughter is regularly seen by a dermatologist and routinely has abnormal moles removed, though only one has been melanoma. Her son is without cancers. Theresa Frazier has a brother,a ge 68, and 2 sisters, ages 25 and 72. None of her siblings have had cancer. Both of her sisters underwent TAH/BSO prior to menopause (62s and 12s).  Theresa Frazier mother was diagnosed with pancreatic cancer at age 62 and died 15 months later at age 39. Her mother underwent TAH/BSO in her 11s. Theresa Frazier mother had 2 sisters and a brother. One sister is alive without cancers at age 42. The other sister died at 25 without cancers. The brother was diagnosed with leukemia at age 56 and died shortly after. Theresa Frazier  maternal grandmother died at the age of 7 with suspected Creutzfeldt-Jakob disease, though it was never confirmed. Her maternal grandfather died at 58 from a stroke. This grandfather had a brother with an unspecified form of cancer and died in his 81s.  Theresa Frazier father is living at age 57 without cancers, though he does have heart problems. He is an only child. Theresa Frazier paternal grandfather died at 64 without cancer. He had a brother with prostate cancer and another brother with an unspecified form of cancer. Theresa Frazier paternal grandmother died at 49 without cancers. One of her sisters died in her 41s with either breast or bone cancer.  Theresa Frazier is unaware of previous family history of genetic testing for hereditary cancer risks. Patient's maternal ancestors are of [place] and [place] descent, and paternal ancestors are of Caucasian descent. There is no reported Ashkenazi Jewish ancestry. There is no known consanguinity.  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 No other pathogenic mutations were identified in the remaining 82 genes tested. [facility]'s Multi-Cancer Panel includes analysis of the following 83 genes: ALK, APC, ATM, AXIN2, BAP1, BARD1, BLM, BMPR1A, BRCA1, BRCA2, BRIP1, CASR, CDC73, CDH1, CDK4, CDKN1B, CDKN1C, CDKN2A, CEBPA, CHEK2, CTNNA1, DICER1, DIS3L2, EGFR, EPCAM, FH, FLCN, GATA2, GPC3, GREM1, HOXB13, HRAS, KIT, MAX, MEN1, MET, MITF, MLH1, MSH2, MSH3, MSH6, MUTYH, NBN, NF1, NF2, NTHL1, PALB2, PDGFRA, PHOX2B, PMS2, POLD1, POLE, POT1, PRKAR1A, PTCH1, PTEN, RAD50, RAD51C, RAD51D, RB1, RECQL4, RET, RUNX1, SDHA,  SDHAF2, SDHB, SDHC, SDHD, SMAD4, SMARCA4, SMARCB1, SMARCE1, STK11, SUFU, TERC, TERT, TMEM127, TP53, TSC1, TSC2, VHL, WRN, WT1.  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 One pathogenic mutation is  insufficient to cause autosomal recessive NTHL1-related conditions. 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 However, she is not expected to be affected and this result is not expected to increase her cancer risks.  The test report will be scanned into EPIC and will be located under the Molecular Pathology section of the Results Review tab.A portion of the result report is included below for reference.    Though one pathogenic mutation was identified in [place], no causative mutations that would explain Theresa Frazier's personal and family history of cancers was identified through her genetic testing. We discussed with Theresa Frazier that since the current genetic testing is not perfect, it is possible there may be a gene mutation in one of these genes that current testing cannot detect, but that chance is small. We also discussed, that it is possible that another gene that has not yet been discovered, or that we have not yet tested, is responsible for the cancer diagnoses in the family. Therefore, important to remain in touch with cancer genetics in the future so that we can continue to offer Theresa Frazier the most up to date genetic testing.   CANCER SCREENING RECOMMENDATIONS: Given Ms. Dempsey's personal and family histories, we must interpret these negative results with some caution.  Families with features suggestive of hereditary risk for cancer tend to have multiple family members with cancer, diagnoses in multiple generations and diagnoses before the age of 80. Ms. Irmen family exhibits some of these features. Thus this result may simply reflect our current inability to detect all mutations within these genes or there may be a different gene that has not yet been discovered or tested. However, since no causative or actionable mutations were identified, Ms. Boardman's cancer treatments and surveillance must be based on her personal health history and family  history of cancers rather than these genetic testing results. Ms. Houseman should continue ovarian cancer surveillance as recommended by her referring provider.  Though her mother had pancreatic cancer, currently, there are no established screening guidelines for individuals with a first degree relative with pancreatic. If Ms. Amrein would like to consider screening for these cancers, she should consult her referring physician regarding options. Ms. Caulder should consult a dermatologist regarding appropriate skin cancer screenings and prevention due to her daughter's history of melanoma.  RECOMMENDATIONS FOR FAMILY MEMBERS: Though Ms. Cocuzza is not thought to be at risk for NTHL1-related conditions, her children could be at risk if their father also carries an Eros mutation. Ms. Allaire reported that her husband's sister died in her 47s with colon cancer. Based on this information, I recommended that her husband consult a genetic counselor and consider genetic testing that includes, but is not limited to, NTHL1 analysis. Testing her husband would give insight into his own risk for hereditary cancer syndromes, but also inform whether their children need to be tested for NTHL1-related conditions or other hereditary cancer syndromes. Ms. Hellmer will let us know if we can be of any assistance in coordinating genetic counseling and/or testing for this family member.   Though Ms. Medders's close female relatives (daughter and sister) are thought to have above-average risk for ovarian cancer based on Ms. Stetson's history, currently, there are no established screening guidelines for individuals with a family history of ovarian.  If Ms. Mccomber close female relatives would like to consider screening for these cancers, they should consult their health care teams regarding options.  FOLLOW-UP: Lastly, we discussed with Ms. Harville that cancer genetics is a rapidly advancing field and it is possible that new  genetic tests will be appropriate for her and/or her family members in the future. We encouraged her to remain in contact with cancer genetics on an annual basis so we can update her personal and family histories and let her know of advances in cancer genetics that may benefit this family.   Our contact number was provided. Ms. Solano questions were answered to her satisfaction, and she knows she is welcome to call us at anytime with additional questions or concerns.   Mal Misty, MS, [facility] Certified Naval [email or web address]

## 2016-11-15 ENCOUNTER — Other Ambulatory Visit (HOSPITAL_BASED_OUTPATIENT_CLINIC_OR_DEPARTMENT_OTHER): Payer: BLUE CROSS/BLUE SHIELD

## 2016-11-15 ENCOUNTER — Other Ambulatory Visit: Payer: Self-pay

## 2016-11-15 DIAGNOSIS — C561 Malignant neoplasm of right ovary: Secondary | ICD-10-CM

## 2016-11-15 LAB — CEA (IN HOUSE-CHCC): CEA (CHCC-In House): <1 ng/mL (ref 0.00–5.00)

## 2016-11-16 LAB — CA 125: Cancer Antigen (CA) 125: 11.9 U/mL (ref 0.0–38.1)

## 2016-11-19 ENCOUNTER — Ambulatory Visit: Payer: BLUE CROSS/BLUE SHIELD | Attending: Gynecologic Oncology | Admitting: Gynecologic Oncology

## 2016-11-19 ENCOUNTER — Encounter: Payer: Self-pay | Admitting: Gynecologic Oncology

## 2016-11-19 ENCOUNTER — Ambulatory Visit (HOSPITAL_COMMUNITY)
Admission: RE | Admit: 2016-11-19 | Discharge: 2016-11-19 | Disposition: A | Discharge status: Home / Self Care | Payer: BLUE CROSS/BLUE SHIELD | Source: Ambulatory Visit | Attending: Gynecologic Oncology | Admitting: Gynecologic Oncology

## 2016-11-19 ENCOUNTER — Other Ambulatory Visit: Payer: Self-pay | Admitting: Gynecologic Oncology

## 2016-11-19 VITALS — BP 129/67 | HR 73 | Temp 98.4°F | Resp 20 | Wt 158.6 lb

## 2016-11-19 DIAGNOSIS — E785 Hyperlipidemia, unspecified: Secondary | ICD-10-CM | POA: Insufficient documentation

## 2016-11-19 DIAGNOSIS — R911 Solitary pulmonary nodule: Secondary | ICD-10-CM

## 2016-11-19 DIAGNOSIS — N816 Rectocele: Secondary | ICD-10-CM | POA: Diagnosis not present

## 2016-11-19 DIAGNOSIS — C561 Malignant neoplasm of right ovary: Secondary | ICD-10-CM

## 2016-11-19 DIAGNOSIS — C569 Malignant neoplasm of unspecified ovary: Secondary | ICD-10-CM

## 2016-11-19 DIAGNOSIS — Z8249 Family history of ischemic heart disease and other diseases of the circulatory system: Secondary | ICD-10-CM | POA: Insufficient documentation

## 2016-11-19 DIAGNOSIS — Z79899 Other long term (current) drug therapy: Secondary | ICD-10-CM | POA: Diagnosis not present

## 2016-11-19 DIAGNOSIS — K219 Gastro-esophageal reflux disease without esophagitis: Secondary | ICD-10-CM | POA: Diagnosis not present

## 2016-11-19 DIAGNOSIS — Z87891 Personal history of nicotine dependence: Secondary | ICD-10-CM | POA: Diagnosis not present

## 2016-11-19 DIAGNOSIS — Z780 Asymptomatic menopausal state: Secondary | ICD-10-CM | POA: Diagnosis not present

## 2016-11-19 DIAGNOSIS — R0789 Other chest pain: Secondary | ICD-10-CM | POA: Insufficient documentation

## 2016-11-19 DIAGNOSIS — R918 Other nonspecific abnormal finding of lung field: Secondary | ICD-10-CM | POA: Diagnosis not present

## 2016-11-19 MED ORDER — ESTROGENS, CONJUGATED 0.625 MG/GM VA CREA: 1.0000 | TOPICAL_CREAM | VAGINAL | 12 refills | Status: DC

## 2016-11-19 NOTE — Progress Notes (Signed)
Follow-up Note: Gyn-Onc  Consult was requested by Dr. Marisue Humble and Dr Laurann Montana for the evaluation of Theresa Frazier 61 y.o. female  CC:  Chief Complaint  Patient presents with  . Ovarian Cancer    follow up    Assessment/Plan:  Theresa Frazier  is a 61 y.o.  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   I discussed the risk for recurrence associated with this disease. I discussed symptoms consistent with recurrence and for her to notify me of these should they occur.  I recommend 3 monthly evaluations with the next in September, 2018 with CEA and CA 125.  Rectocele and vaginal dryness - recommended vaginal premarin for dryness symptoms. Discussed risks of hormonal use post-menopausal (these are less with vaginal use). Discussed options of referral to urogynecolgy  Chest wall pain - likely musculoskeletal. Will obtain CXR to evaluate for obvious bony met or chest wall recurrence.  HPI: Theresa Frazier is a 61 year old parous (P2) woman who is seen in consultation at the request of Dr Marisue Humble and Dr Laurann Montana for a complex cystic and solid pelvic mass.  The patient reported to her PCP's that she had abdominal bloating and fullness for 6-8 weeks since October 2017 when she saw them on 04/25/16. They performed a pelvic examination on that visit and appreciated a pelvic mass. A TVUS was ordered and performed on 05/04/16 and revealed a uterus measuring 8.4x3.7x4.8cm with a 7cm endometrial stripe (she has had no bleeding symptoms) and the ovaries were not discretely seen. Instead, there is a large complex cystic and solid mass at midline, extending into the right and left adnexum with internal blood flow. The mass measures 11 x 7.4 x 14.1 cm. Separate ovaries are not identified.  A CT scan had been ordered but canceled upon the US findings and plan for consultation with me.  She is feeling fairly well. Her pants fit differently lately and she feels  she may have lost weight. Her bladder and bowels are moving normally.  She denies pain.  She has no family history for breast/ovarian cancer or any other significant cancer history.  Her surgical history is significant for tonsillectomy only (no abdominal surgery).  CT scan on 05/16/16 showed: large (15cm) complex mass in the central anatomic pelvis appears to arise from the right ovary and is highly worrisome for a cystic ovarian carcinoma and a small amount of pelvic free fluid. CA 125 on 05/14/16 was elevated at 72.  On 05/31/16 she went to the OR for an exploratory laparotomy, TAH, BSO, omentectomy and peritoneal biopsies (no lymphadenectomy due to frozen section showing mucinous cell type).Intraoperative findings were significant for a 15cm right ovarian mass non-adherent, and otherwise normal peritoneal cavity and appendix. Frozen showed mucinous adencarcinoma.  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  Interval Hx: Since surgery she has been feeling very well.  She has had twinges of pain in her posterior right back/thorax when she is working out.  She has vaginal dryness and a bulging symptom in the vagina with exercise (strenuous).  CA 125 on 11/15/16 was normal at 11.9 CEA on 11/15/16 was normal at <1  Current Meds:  Outpatient Encounter Prescriptions as of 11/19/2016  Medication Sig  . hydroxypropyl methylcellulose / hypromellose (ISOPTO TEARS / GONIOVISC) 2.5 % ophthalmic solution Place 1-2 drops into both eyes 3 (three) times daily as needed  for dry eyes.  . Multiple Vitamin (MULTIVITAMIN WITH MINERALS) TABS tablet Take 1 tablet by mouth daily.  . NON FORMULARY Patient uses essential oils as needed  . [DISCONTINUED] acetaminophen (TYLENOL) 500 MG chewable tablet Chew 500 mg by mouth every 6 (six) hours as needed for pain.  . [DISCONTINUED] Ascorbic Acid (VITAMIN C) 1000 MG  tablet Take 1,000 mg by mouth 2 (two) times daily.  . [DISCONTINUED] enoxaparin (LOVENOX) 40 MG/0.4ML injection Inject 0.4 mLs (40 mg total) into the skin daily.  . [DISCONTINUED] ibuprofen (ADVIL,MOTRIN) 200 MG tablet Take 200 mg by mouth every 6 (six) hours as needed.   No facility-administered encounter medications on file as of 11/19/2016.     Allergy: No Known Allergies  Social Hx:   Social History   Social History  . Marital status: Married    Spouse name: N/A  . Number of children: N/A  . Years of education: N/A   Occupational History  . Not on file.   Social History Main Topics  . Smoking status: Former Smoker    Years: 7.00    Types: Cigarettes    Quit date: 11/07/1985  . Smokeless tobacco: Never Used  . Alcohol use No  . Drug use: No  . Sexual activity: Not on file   Other Topics Concern  . Not on file   Social History Narrative  . No narrative on file    Past Surgical Hx:  Past Surgical History:  Procedure Laterality Date  . ABDOMINAL HYSTERECTOMY Left 05/31/2016   Procedure: HYSTERECTOMY ABDOMINAL;  Surgeon: Everitt Amber, MD;  Location: [facility];  Service: Gynecology;  Laterality: Left;  . BREAST SURGERY  1993   left breast-calcification  . COLONOSCOPY    . LAPAROTOMY N/A 05/31/2016   Procedure: EXPLORATORY LAPAROTOMY;  Surgeon: Everitt Amber, MD;  Location: [facility];  Service: Gynecology;  Laterality: N/A;  . OMENTECTOMY  05/31/2016   Procedure: OMENTECTOMY AND STAGING FOR OVARIAN CANCER;  Surgeon: Everitt Amber, MD;  Location: [facility];  Service: Gynecology;;  . SALPINGOOPHORECTOMY Right 05/31/2016   Procedure: BILATERAL SALPINGO OOPHORECTOMY;  Surgeon: Everitt Amber, MD;  Location: [facility];  Service: Gynecology;  Laterality: Right;  . TONSILLECTOMY      Past Medical Hx:  Past Medical History:  Diagnosis Date  . 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 Dyslipidemia   . Family history of early CAD   . Genetic testing 09/28/2016   Genetic  testing revealed ONE pathogenic mutation in [place]. Though a pathogenic mutation was identified, increased risk for cancer is only associated with TWO (biallelic) pathogenic mutations. No additional mutations or variants of uncertain significance were identified. Testing was performed through [facility]'s 46-gene Common Hereditary Cancers Panel. [facility]'s Common Hereditary Cancers Panel includ  . GERD (gastroesophageal reflux disease)     Past Gynecological History:  SVD x 2 No LMP recorded. Patient is postmenopausal.  Family Hx:  Family History  Problem Relation Age of Onset  . Pancreatic cancer Mother 37       d.73 - two months after diagnosis  . Addison's disease Maternal Aunt 76  . Leukemia Maternal Uncle 30       d.59 - shortly after diagnosis  . Melanoma Daughter 102    Review of Systems:  Constitutional  Feels well,    ENT Normal appearing ears and nares bilaterally Skin/Breast  No rash, sores, jaundice, itching, dryness Cardiovascular  No chest pain, shortness of breath, or edema  Pulmonary  No cough or wheeze.  Gastro Intestinal  No nausea, vomitting, or diarrhoea. No bright red blood per rectum, no abdominal pain, change in bowel movement, or constipation.  Genito Urinary  No frequency, urgency, dysuria, no bleeding Musculo Skeletal  No myalgia, arthralgia, joint swelling or pain  Neurologic  No weakness, numbness, change in gait,  Psychology  No depression, anxiety, insomnia.   Vitals:  Blood pressure 129/67, pulse 73, temperature 98.4 F (36.9 C), resp. rate 20, weight 158 lb 9.6 oz (71.9 kg), SpO2 100 %.  Physical Exam: WD in NAD Neck  Supple NROM, without any enlargements.  Lymph Node Survey No cervical supraclavicular or inguinal adenopathy Cardiovascular  Pulse normal rate, regularity and rhythm. S1 and S2 normal.  Lungs  Clear to auscultation bilateraly, without wheezes/crackles/rhonchi. Good air movement.  Skin  No rash/lesions/breakdown  Psychiatry   Alert and oriented to person, place, and time  Abdomen  Normoactive bowel sounds, abdomen soft, non-tender and nonobese without evidence of hernia. Incision is well healed without signs of infection or induration.. Back No CVA tenderness Genito Urinary: vaginal cuff in tact, no lesions. No masses. She has posterior vaginal wall prolapse (rectocele) to the level of the introitus with bearing down. Rectal  deferred  Extremities  No bilateral cyanosis, clubbing or edema.   Donaciano Eva, MD  11/19/2016, 1:09 PM

## 2016-11-19 NOTE — Patient Instructions (Signed)
Plan to have a chest xray today.  Begin using vaginal premarin cream at nighttime three times a week.  Plan to follow up in three months or sooner if needed.

## 2016-11-19 NOTE — Addendum Note (Signed)
Addended by: Everitt Amber C on: 11/19/2016 03:09 PM   Modules accepted: Orders

## 2016-11-20 ENCOUNTER — Telehealth: Payer: Self-pay | Admitting: *Deleted

## 2016-11-20 NOTE — Telephone Encounter (Signed)
Scheduled CT scan and lab appts. Contacted the patient and gave her the dtes/times. Lab appt tomorrow June 20th at 10:15am. CT scan June 21st at [facility]; arrive at 2:45pm. (NPO 4hrs prior)

## 2016-11-21 ENCOUNTER — Other Ambulatory Visit (HOSPITAL_BASED_OUTPATIENT_CLINIC_OR_DEPARTMENT_OTHER): Payer: BLUE CROSS/BLUE SHIELD

## 2016-11-21 DIAGNOSIS — C561 Malignant neoplasm of right ovary: Secondary | ICD-10-CM | POA: Diagnosis not present

## 2016-11-21 DIAGNOSIS — C569 Malignant neoplasm of unspecified ovary: Secondary | ICD-10-CM

## 2016-11-21 LAB — BASIC METABOLIC PANEL
ANION GAP: 10 meq/L (ref 3–11)
BUN: 10.7 mg/dL (ref 7.0–26.0)
CHLORIDE: 106 meq/L (ref 98–109)
CO2: 26 meq/L (ref 22–29)
Calcium: 9.5 mg/dL (ref 8.4–10.4)
Creatinine: 0.8 mg/dL (ref 0.6–1.1)
EGFR: 78 mL/min/{1.73_m2} — AB (ref >90)
GLUCOSE: 87 mg/dL (ref 70–140)
POTASSIUM: 4 meq/L (ref 3.5–5.1)
Sodium: 141 mEq/L (ref 136–145)

## 2016-11-21 LAB — CEA (IN HOUSE-CHCC): CEA (CHCC-In House): <1 ng/mL (ref 0.00–5.00)

## 2016-11-22 ENCOUNTER — Telehealth: Payer: Self-pay | Admitting: Gynecologic Oncology

## 2016-11-22 ENCOUNTER — Ambulatory Visit (HOSPITAL_COMMUNITY)
Admission: RE | Admit: 2016-11-22 | Discharge: 2016-11-22 | Disposition: A | Discharge status: Home / Self Care | Payer: BLUE CROSS/BLUE SHIELD | Source: Ambulatory Visit | Attending: Gynecologic Oncology | Admitting: Gynecologic Oncology

## 2016-11-22 DIAGNOSIS — R911 Solitary pulmonary nodule: Secondary | ICD-10-CM

## 2016-11-22 DIAGNOSIS — I700 Atherosclerosis of aorta: Secondary | ICD-10-CM | POA: Diagnosis not present

## 2016-11-22 DIAGNOSIS — C561 Malignant neoplasm of right ovary: Secondary | ICD-10-CM

## 2016-11-22 DIAGNOSIS — J439 Emphysema, unspecified: Secondary | ICD-10-CM | POA: Diagnosis not present

## 2016-11-22 LAB — CA 125: CANCER ANTIGEN (CA) 125: 13 U/mL (ref 0.0–38.1)

## 2016-11-22 MED ORDER — IOPAMIDOL (ISOVUE-300) INJECTION 61%
INTRAVENOUS | Status: AC
  Filled 2016-11-22: qty 75

## 2016-11-22 MED ORDER — IOPAMIDOL (ISOVUE-300) INJECTION 61%
75.0000 mL | Freq: Once | INTRAVENOUS | Status: AC | PRN
  Administered 2016-11-22: 75 mL via INTRAVENOUS

## 2016-11-22 NOTE — Telephone Encounter (Signed)
Returned call to patient.  Patient stating she was able to pick up her premarin cream.  The amount she was originally quoted included her deductible.  No concerns voiced.

## 2016-11-26 ENCOUNTER — Telehealth: Payer: Self-pay

## 2016-11-26 NOTE — Telephone Encounter (Signed)
-----   Message from Everitt Amber, MD sent at 11/26/2016  9:11 AM EDT ----- Please let the patient know that her CT scan showed no lesion that had been seen on Xray. This is reassuring. There is a tiny nodule that is likely benign, and they recommend repeating the CT in 12 months to monitor.  Donaciano Eva, MD

## 2016-11-26 NOTE — Telephone Encounter (Signed)
Told Theresa Frazier the results of the CT Scan as noted below by Dr. Denman George.

## 2016-12-19 ENCOUNTER — Telehealth: Payer: Self-pay

## 2016-12-19 NOTE — Telephone Encounter (Signed)
Faxed records as requested to [facility] as requested (19) pages.

## 2016-12-20 ENCOUNTER — Telehealth: Payer: Self-pay | Admitting: Gynecologic Oncology

## 2016-12-20 DIAGNOSIS — C569 Malignant neoplasm of unspecified ovary: Secondary | ICD-10-CM

## 2016-12-20 NOTE — Telephone Encounter (Signed)
Patient called with several questions about her previous visit, recent labs, etc.  All questions answered.  Advised to call for any needs or concerns.

## 2016-12-21 ENCOUNTER — Other Ambulatory Visit: Payer: Self-pay | Admitting: Family Medicine

## 2016-12-21 DIAGNOSIS — Z1231 Encounter for screening mammogram for malignant neoplasm of breast: Secondary | ICD-10-CM

## 2016-12-28 ENCOUNTER — Ambulatory Visit: Payer: BLUE CROSS/BLUE SHIELD

## 2016-12-29 ENCOUNTER — Ambulatory Visit: Payer: Self-pay | Admitting: Family Medicine

## 2017-01-21 ENCOUNTER — Ambulatory Visit: Payer: BLUE CROSS/BLUE SHIELD

## 2017-01-21 ENCOUNTER — Ambulatory Visit
Admission: RE | Admit: 2017-01-21 | Discharge: 2017-01-21 | Disposition: A | Discharge status: Home / Self Care | Payer: BLUE CROSS/BLUE SHIELD | Source: Ambulatory Visit | Attending: Family Medicine | Admitting: Family Medicine

## 2017-01-21 DIAGNOSIS — Z1231 Encounter for screening mammogram for malignant neoplasm of breast: Secondary | ICD-10-CM | POA: Diagnosis not present

## 2017-01-29 ENCOUNTER — Ambulatory Visit (INDEPENDENT_AMBULATORY_CARE_PROVIDER_SITE_OTHER): Payer: BLUE CROSS/BLUE SHIELD | Admitting: Family Medicine

## 2017-01-29 ENCOUNTER — Encounter: Payer: Self-pay | Admitting: Family Medicine

## 2017-01-29 VITALS — BP 115/78 | HR 60 | Temp 98.1°F | Resp 18 | Ht 62.25 in | Wt 155.4 lb

## 2017-01-29 DIAGNOSIS — Z114 Encounter for screening for human immunodeficiency virus [HIV]: Secondary | ICD-10-CM | POA: Diagnosis not present

## 2017-01-29 DIAGNOSIS — Z0000 Encounter for general adult medical examination without abnormal findings: Secondary | ICD-10-CM

## 2017-01-29 DIAGNOSIS — Z23 Encounter for immunization: Secondary | ICD-10-CM | POA: Diagnosis not present

## 2017-01-29 DIAGNOSIS — Z1159 Encounter for screening for other viral diseases: Secondary | ICD-10-CM | POA: Diagnosis not present

## 2017-01-29 DIAGNOSIS — K439 Ventral hernia without obstruction or gangrene: Secondary | ICD-10-CM | POA: Diagnosis not present

## 2017-01-29 DIAGNOSIS — E663 Overweight: Secondary | ICD-10-CM

## 2017-01-29 DIAGNOSIS — M25511 Pain in right shoulder: Secondary | ICD-10-CM | POA: Diagnosis not present

## 2017-01-29 NOTE — Patient Instructions (Signed)
     IF you received an x-ray today, you will receive an invoice from [facility]. Please contact [facility] at [phone] with questions or concerns regarding your invoice.   IF you received labwork today, you will receive an invoice from [facility]. Please contact [facility] at [phone] with questions or concerns regarding your invoice.   Our billing staff will not be able to assist you with questions regarding bills from these companies.  You will be contacted with the lab results as soon as they are available. The fastest way to get your results is to activate your My Chart account. Instructions are located on the last page of this paperwork. If you have not heard from us regarding the results in 2 weeks, please contact this office.     

## 2017-01-29 NOTE — Progress Notes (Signed)
Chief Complaint  Patient presents with  . New Patient (Initial Visit)    cpe without pap    Subjective:  Theresa Frazier is a 61 y.o. female here for a health maintenance visit.  Patient is new pt  Mammogram done 01/2017 She is s/p TAH with BSO done in May 31, 2016 for mucinous ovarian cancer She will continue to see Dr. Denman George   She reports that she has right shoulder pain that is getting worse  States that she had xray and MRI for evaluation She is not getting better or worse She does not feel the shoulder pain which she calls soreness with exercise Just when she takes a deep breath She would rate her pain as a 5/10  Patient Active Problem List   Diagnosis Date Noted  . Rectocele 11/19/2016  . Nodule of left lung 11/19/2016  . Genetic testing 09/28/2016  . Ovarian cancer on right ([place]) 05/31/2016  . Ovarian cancer ([place]) 05/31/2016  . Pelvic mass 05/14/2016  . Hyperlipidemia 11/07/2012  . Fatigue 11/07/2012  . Family history of early CAD 11/07/2012    Past Medical History:  Diagnosis Date  . 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 Dyslipidemia   . Family history of early CAD   . Genetic testing 09/28/2016   Genetic testing revealed ONE pathogenic mutation in [place]. Though a pathogenic mutation was identified, increased risk for cancer is only associated with TWO (biallelic) pathogenic mutations. No additional mutations or variants of uncertain significance were identified. Testing was performed through [facility]'s 46-gene Common Hereditary Cancers Panel. [facility]'s Common Hereditary Cancers Panel includ  . GERD (gastroesophageal reflux disease)     Past Surgical History:  Procedure Laterality Date  . ABDOMINAL HYSTERECTOMY Left 05/31/2016   Procedure: HYSTERECTOMY ABDOMINAL;  Surgeon: Everitt Amber, MD;  [facility]: [facility];  Service: Gynecology;  Laterality: Left;  . BREAST SURGERY  1993   left breast-calcification  . COLONOSCOPY    . LAPAROTOMY  N/A 05/31/2016   Procedure: EXPLORATORY LAPAROTOMY;  Surgeon: Everitt Amber, MD;  [facility]: [facility];  Service: Gynecology;  Laterality: N/A;  . OMENTECTOMY  05/31/2016   Procedure: OMENTECTOMY AND STAGING FOR OVARIAN CANCER;  Surgeon: Everitt Amber, MD;  [facility];  Service: Gynecology;;  . SALPINGOOPHORECTOMY Right 05/31/2016   Procedure: BILATERAL SALPINGO OOPHORECTOMY;  Surgeon: Everitt Amber, MD;  Location: [facility];  Service: Gynecology;  Laterality: Right;  . TONSILLECTOMY       Outpatient Medications Prior to Visit  Medication Sig Dispense Refill  . conjugated estrogens (PREMARIN) vaginal cream Place 1 Applicatorful vaginally 3 (three) times a week. 42.5 g 12  . hydroxypropyl methylcellulose / hypromellose (ISOPTO TEARS / GONIOVISC) 2.5 % ophthalmic solution Place 1-2 drops into both eyes 3 (three) times daily as needed for dry eyes.    . Multiple Vitamin (MULTIVITAMIN WITH MINERALS) TABS tablet Take 1 tablet by mouth daily.    . NON FORMULARY Patient uses essential oils as needed     No facility-administered medications prior to visit.     Not on File   Family History  Problem Relation Age of Onset  . Pancreatic cancer Mother 80       d.73 - two months after diagnosis  . Addison's disease Maternal Aunt 76  . Leukemia Maternal Uncle 32       d.59 - shortly after diagnosis  . Melanoma Daughter 35     Health Habits: Dental Exam: up to date Eye Exam:  up to date Exercise: 7 times/week on average Current exercise activities: walking/running Diet: balanced  Social History   Social History  . Marital status: Married    Spouse name: N/A  . Number of children: N/A  . Years of education: N/A   Occupational History  . Not on file.   Social History Main Topics  . Smoking status: Former Smoker    Years: 7.00    Types: Cigarettes    Quit date: 11/07/1985  . Smokeless tobacco: Never Used  . Alcohol use No  . Drug use: No  . Sexual activity: Not on file   Other Topics  Concern  . Not on file   Social History Narrative  . No narrative on file   History  Alcohol Use No   History  Smoking Status  . Former Smoker  . Years: 7.00  . Types: Cigarettes  . Quit date: 11/07/1985  Smokeless Tobacco  . Never Used   History  Drug Use No    GYN: Sexual Health Menstrual status: regular menses LMP: No LMP recorded. Patient is postmenopausal. Last pap smear: see HM section History of abnormal pap smears: s/p tah bso   Health Maintenance: See under health Maintenance activity for review of completion dates as well. There is no immunization history for the selected administration types on file for this patient.    Depression Screen-PHQ2/9 Depression screen [facility] 2/9 01/29/2017  Decreased Interest 0  Down, Depressed, Hopeless 0  PHQ - 2 Score 0       Depression Severity and Treatment Recommendations:  0-4= None  5-9= Mild / Treatment: Support, educate to call if worse; return in one month  10-14= Moderate / Treatment: Support, watchful waiting; Antidepressant or Psycotherapy  15-19= Moderately severe / Treatment: Antidepressant OR Psychotherapy  >= 20 = Major depression, severe / Antidepressant AND Psychotherapy    Review of Systems   Review of Systems  Constitutional: Negative for chills, fever, malaise/fatigue and weight loss.  HENT: Negative for ear discharge, ear pain and nosebleeds.   Eyes: Negative for blurred vision and double vision.  Respiratory: Negative for cough, shortness of breath and wheezing.   Cardiovascular: Negative for chest pain, palpitations and orthopnea.  Gastrointestinal: Negative for abdominal pain, nausea and vomiting.  Genitourinary: Negative for dysuria, frequency and urgency.  Musculoskeletal: Positive for joint pain and neck pain. Negative for back pain, falls and myalgias.  Skin: Negative for itching and rash.  Neurological: Negative for dizziness, tingling, tremors and headaches.  Psychiatric/Behavioral:  Negative for depression and suicidal ideas. The patient is not nervous/anxious and does not have insomnia.     See HPI for ROS as well.    Objective:   Vitals:   01/29/17 0855  BP: 115/78  Pulse: 60  Resp: 18  Temp: 98.1 F (36.7 C)  TempSrc: Oral  SpO2: 98%  Weight: 155 lb 6.4 oz (70.5 kg)  Height: 5' 2.25" (1.581 m)    Body mass index is 28.2 kg/m.  Physical Exam  Constitutional: She is oriented to person, place, and time. She appears well-developed and well-nourished.  HENT:  Head: Normocephalic and atraumatic.  Right Ear: External ear normal.  Left Ear: External ear normal.  Eyes: Pupils are equal, round, and reactive to light. Conjunctivae and EOM are normal.  Neck: Normal range of motion. Neck supple. No thyromegaly present.  Cardiovascular: Normal rate, regular rhythm, normal heart sounds and intact distal pulses.   No murmur heard. Pulmonary/Chest: Effort normal and breath sounds normal. No  respiratory distress. She has no wheezes. She has no rales.  Abdominal: Soft. Bowel sounds are normal. She exhibits no distension. There is no hepatosplenomegaly. There is no tenderness. There is no rebound, no guarding and no CVA tenderness.    Musculoskeletal: Normal range of motion. She exhibits no edema.  Neurological: She is alert and oriented to person, place, and time. She has normal reflexes. No cranial nerve deficit.  Skin: Skin is warm. No rash noted. No erythema.  Psychiatric: She has a normal mood and affect. Her behavior is normal. Judgment and thought content normal.       Assessment/Plan:   Patient was seen for a health maintenance exam.  Counseled the patient on health maintenance issues. Reviewed her health mainteance schedule and ordered appropriate tests (see orders.) Counseled on regular exercise and weight management. Recommend regular eye exams and dental cleaning.   The following issues were addressed today for health maintenance:   Nami was seen  today for new patient (initial visit).  Diagnoses and all orders for this visit:  Encounter for health maintenance examination in adult- age appropriate screenings reviewed -     Lipid panel -     TSH -     Comprehensive metabolic panel -     HIV antibody  Nontraumatic pain of right shoulder- referral to PT started and advised pt to follow up if her pain does not improve after PT -     Ambulatory referral to Physical Therapy  Ventral hernia without obstruction or gangrene- discussed that since she is asymptomatic she should just continue to exercise and if there is any pain or abdominal wall discomfort or enlarging bulge to follow up with office visit  Need for Tdap vaccination  Overweight- improved with exercise -     Lipid panel  Need for hepatitis C screening test -     HCV Ab w/Rflx to Verification  Encounter for screening for HIV -     HIV antibody  Other orders -     Tdap vaccine greater than or equal to 7yo IM -     Cancel: Flu Vaccine QUAD 36+ mos IM -     Cancel: Ambulatory referral to Gastroenterology    No Follow-up on file.    Body mass index is 28.2 kg/m.:  Discussed the patient's BMI with patient. The BMI body mass index is 28.2 kg/m.     Future Appointments Date Time Provider [place]  02/19/2017 1:15 PM [facility] ONLY [facility] None  02/22/2017 1:15 PM Everitt Amber, MD [facility] None    Patient Instructions       IF you received an x-ray today, you will receive an invoice from [facility]. Please contact [facility] at [phone] with questions or concerns regarding your invoice.   IF you received labwork today, you will receive an invoice from [place]. Please contact [facility] at [phone] with questions or concerns regarding your invoice.   Our billing staff will not be able to assist you with questions regarding bills from these companies.  You will be contacted with the lab results as soon as they are  available. The fastest way to get your results is to activate your My Chart account. Instructions are located on the last page of this paperwork. If you have not heard from [place] regarding the results in 2 weeks, please contact this office.

## 2017-01-29 NOTE — Progress Notes (Deleted)
QUICK REFERENCE INFORMATION: The ABCs of Providing the Annual Wellness Visit  CMS.gov Medicare Learning Network  [employer] Annual Wellness Visit  Subjective:   Theresa Frazier is a 61 y.o. Female who presents for an Annual Wellness Visit.  Patient Active Problem List   Diagnosis Date Noted  . Rectocele 11/19/2016  . Nodule of left lung 11/19/2016  . Genetic testing 09/28/2016  . Ovarian cancer on right ([place]) 05/31/2016  . Ovarian cancer ([place]) 05/31/2016  . Pelvic mass 05/14/2016  . Hyperlipidemia 11/07/2012  . Fatigue 11/07/2012  . Family history of early CAD 11/07/2012    Past Medical History:  Diagnosis Date  . 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 Dyslipidemia   . Family history of early CAD   . Genetic testing 09/28/2016   Genetic testing revealed ONE pathogenic mutation in [place]. Though a pathogenic mutation was identified, increased risk for cancer is only associated with TWO (biallelic) pathogenic mutations. No additional mutations or variants of uncertain significance were identified. Testing was performed through [employer]'s 46-gene Common Hereditary Cancers Panel. [employer]'s Common Hereditary Cancers Panel includ  . GERD (gastroesophageal reflux disease)      Past Surgical History:  Procedure Laterality Date  . ABDOMINAL HYSTERECTOMY Left 05/31/2016   Procedure: HYSTERECTOMY ABDOMINAL;  Surgeon: Everitt Amber, MD;  Location: [facility];  Service: Gynecology;  Laterality: Left;  . BREAST SURGERY  1993   left breast-calcification  . COLONOSCOPY    . LAPAROTOMY N/A 05/31/2016   Procedure: EXPLORATORY LAPAROTOMY;  Surgeon: Everitt Amber, MD;  [facility]: [facility];  Service: Gynecology;  Laterality: N/A;  . OMENTECTOMY  05/31/2016   Procedure: OMENTECTOMY AND STAGING FOR OVARIAN CANCER;  Surgeon: Everitt Amber, MD;  Location: [facility];  Service: Gynecology;;  . SALPINGOOPHORECTOMY Right 05/31/2016   Procedure: BILATERAL SALPINGO OOPHORECTOMY;  Surgeon: Everitt Amber, MD;   [facility];  Service: Gynecology;  Laterality: Right;  . TONSILLECTOMY       Outpatient Medications Prior to Visit  Medication Sig Dispense Refill  . conjugated estrogens (PREMARIN) vaginal cream Place 1 Applicatorful vaginally 3 (three) times a week. 42.5 g 12  . hydroxypropyl methylcellulose / hypromellose (ISOPTO TEARS / GONIOVISC) 2.5 % ophthalmic solution Place 1-2 drops into both eyes 3 (three) times daily as needed for dry eyes.    . Multiple Vitamin (MULTIVITAMIN WITH MINERALS) TABS tablet Take 1 tablet by mouth daily.    . NON FORMULARY Patient uses essential oils as needed     No facility-administered medications prior to visit.     No Known Allergies   Family History  Problem Relation Age of Onset  . Pancreatic cancer Mother 24       d.73 - two months after diagnosis  . Addison's disease Maternal Aunt 76  . Leukemia Maternal Uncle 81       d.59 - shortly after diagnosis  . Melanoma Daughter 79     Social History   Social History  . Marital status: Married    Spouse name: N/A  . Number of children: N/A  . Years of education: N/A   Social History Main Topics  . Smoking status: Former Smoker    Years: 7.00    Types: Cigarettes    Quit date: 11/07/1985  . Smokeless tobacco: Never Used  . Alcohol use No  . Drug use: No  . Sexual activity: Not on file   Other Topics Concern  . Not on file   Social History Narrative  .  No narrative on file      Recent Hospitalizations? {Yes/No w/ pre-defaulted NT:70017}  Health Habits: Current exercise activities include: {misc; exercise types:16438} Exercise: {NUMBERS; 0-10:5044} times/week. Diet: {Desc; diets:16563}  Alcohol intake: ***  Health Risk Assessment: The patient has completed a Health Risk Assessment. This has been reveiwed with them and has been scanned into the [facility] system as an attached document.  Current Medical Providers and Suppliers: [facility] Patient Care Team: Kelton Pillar, MD as PCP  - General (Family Medicine) Future Appointments Date Time Provider [place]  01/29/2017 8:40 AM Forrest Moron, MD PCP-PCP [facility]  02/19/2017 1:15 PM [facility] ONLY [facility] None  02/22/2017 1:15 PM Everitt Amber, MD [facility] None   ***  Age-appropriate Screening Schedule: The list below includes current immunization status and future screening recommendations based on patient's age. Orders for these recommended tests are listed in the plan section. The patient has been provided with a written plan.  There is no immunization history on file for this patient.  Health Maintenance reviewed - {health maintenance:315237}.   Depression Screen-PHQ2/9 completed today  No flowsheet data found.    Depression Severity and Treatment Recommendations:  0-4= None  5-9= Mild / Treatment: Support, educate to call if worse; return in one month  10-14= Moderate / Treatment: Support, watchful waiting; Antidepressant or Psycotherapy  15-19= Moderately severe / Treatment: Antidepressant OR Psychotherapy  >= 20 = Major depression, severe / Antidepressant AND Psychotherapy  Functional Status Survey:    Hearing Evaluation: 1. Do you have trouble hearing the television when others do not? {Yes/No w/ pre-defaulted No:34644} 2. Do you have to strain to hear/understand conversations? {Yes/No w/ pre-defaulted CB:44967}   Advanced Care Planning: 1. Patient has executed an Advance Directive: {yes/no:311199}  2. If no, patient was given the opportunity to execute an Advance Directive today? {Yes/No-Ex:120004}  3. Are the patient's advanced directives in [place]? {yes/no:311199} 4. This patient has the ability to prepare an Advance Directive: {Yes/No w/ pre-defaulted RFF:63846} 5. Provider is willing to follow the patient's wishes: {Yes/No w/ pre-defaulted KZL:93570}  Cognitive Assessment: Does the patient have evidence of cognitive impairment? {Yes/No w/ pre-defaulted VX:79390} The patient does  not have any evidence of any cognitive problems and denies any  change in mood/affect, appearance, speech, memory or motor skills.  Identification of Risk Factors: Risk factors include: {MISC; WELLNESS RISK FACTORS:20653}   Objective:   There were no vitals filed for this visit.  There is no height or weight on file to calculate BMI.   Physical Exam   Assessment/Plan:   Patient Self-Management and Personalized Health Advice The patient has been provided with information about: {MISC; PERSONALIZED HEALTH ADVICE:20654}  During the course of the visit the patient was educated and counseled about appropriate screening and preventive services including:  {Plan; Unisex HM/PEX Well Visit:35827}  There is no height or weight on file to calculate BMI. Discussed the patient's BMI with her. The BMI {BMI plan (MU NQF measure 421):19504}  There are no diagnoses linked to this encounter.    No Follow-up on file.  Future Appointments Date Time Provider [place]  01/29/2017 8:40 AM Forrest Moron, MD PCP-PCP [facility]  02/19/2017 1:15 PM [facility] ONLY [facility] None  02/22/2017 1:15 PM Everitt Amber, MD [facility] None    There are no Patient Instructions on file for this visit.  An after visit summary with all of these plans was given to the patient.

## 2017-01-30 LAB — COMPREHENSIVE METABOLIC PANEL
ALK PHOS: 93 IU/L (ref 39–117)
ALT: 16 IU/L (ref 0–32)
AST: 18 IU/L (ref 0–40)
Albumin/Globulin Ratio: 2.1 (ref 1.2–2.2)
Albumin: 4.4 g/dL (ref 3.6–4.8)
BILIRUBIN TOTAL: 0.4 mg/dL (ref 0.0–1.2)
BUN / CREAT RATIO: 10 — AB (ref 12–28)
BUN: 8 mg/dL (ref 8–27)
CHLORIDE: 104 mmol/L (ref 96–106)
CO2: 24 mmol/L (ref 20–29)
CREATININE: 0.8 mg/dL (ref 0.57–1.00)
Calcium: 9.2 mg/dL (ref 8.7–10.3)
GFR calc Af Amer: 92 mL/min/{1.73_m2} (ref >59)
GFR calc non Af Amer: 80 mL/min/{1.73_m2} (ref >59)
GLOBULIN, TOTAL: 2.1 g/dL (ref 1.5–4.5)
Glucose: 93 mg/dL (ref 65–99)
POTASSIUM: 4 mmol/L (ref 3.5–5.2)
SODIUM: 143 mmol/L (ref 134–144)
Total Protein: 6.5 g/dL (ref 6.0–8.5)

## 2017-01-30 LAB — TSH: TSH: 1.76 u[IU]/mL (ref 0.450–4.500)

## 2017-01-30 LAB — LIPID PANEL
CHOL/HDL RATIO: 2.9 ratio (ref 0.0–4.4)
Cholesterol, Total: 203 mg/dL — ABNORMAL HIGH (ref 100–199)
HDL: 70 mg/dL (ref >39)
LDL CALC: 123 mg/dL — AB (ref 0–99)
TRIGLYCERIDES: 50 mg/dL (ref 0–149)
VLDL CHOLESTEROL CAL: 10 mg/dL (ref 5–40)

## 2017-01-30 LAB — HIV ANTIBODY (ROUTINE TESTING W REFLEX): HIV SCREEN 4TH GENERATION: NONREACTIVE

## 2017-01-30 LAB — HCV AB W/RFLX TO VERIFICATION

## 2017-01-30 LAB — HCV INTERPRETATION

## 2017-02-07 ENCOUNTER — Telehealth: Payer: Self-pay | Admitting: Family Medicine

## 2017-02-07 ENCOUNTER — Ambulatory Visit: Payer: BLUE CROSS/BLUE SHIELD | Attending: Family Medicine | Admitting: Physical Therapy

## 2017-02-07 DIAGNOSIS — M546 Pain in thoracic spine: Secondary | ICD-10-CM | POA: Diagnosis not present

## 2017-02-07 DIAGNOSIS — M25511 Pain in right shoulder: Secondary | ICD-10-CM | POA: Diagnosis not present

## 2017-02-07 DIAGNOSIS — R293 Abnormal posture: Secondary | ICD-10-CM

## 2017-02-07 DIAGNOSIS — G8929 Other chronic pain: Secondary | ICD-10-CM | POA: Diagnosis not present

## 2017-02-07 NOTE — Therapy (Signed)
[place], [place], [address] Phone: [phone]   Fax:  [phone]  Physical Therapy Evaluation  Patient Details  Name: Theresa Frazier MRN: [id number] Date of Birth: Oct 19, 1955 Referring Provider: Nolon Rod. Zoe  Encounter Date: 02/07/2017      PT End of Session - 02/07/17 1100    Visit Number 1   Number of Visits 7   Date for PT Re-Evaluation 03/21/17   Authorization Type [employer]   Authorization Time Period 03-21-17   PT Start Time 1100   PT Stop Time 1156   PT Time Calculation (min) 56 min   Activity Tolerance Patient tolerated treatment well   Behavior During Therapy WFL for tasks assessed/performed      Past Medical History:  Diagnosis Date  . 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 Dyslipidemia   . Family history of early CAD   . Genetic testing 09/28/2016   Genetic testing revealed ONE pathogenic mutation in [place]. Though a pathogenic mutation was identified, increased risk for cancer is only associated with TWO (biallelic) pathogenic mutations. No additional mutations or variants of uncertain significance were identified. Testing was performed through [employer]'s 46-gene Common Hereditary Cancers Panel. [employer]'s Common Hereditary Cancers Panel includ  . GERD (gastroesophageal reflux disease)     Past Surgical History:  Procedure Laterality Date  . ABDOMINAL HYSTERECTOMY Left 05/31/2016   Procedure: HYSTERECTOMY ABDOMINAL;  Surgeon: Everitt Amber, MD;  [facility];  Service: Gynecology;  Laterality: Left;  . BREAST SURGERY  1993   left breast-calcification  . COLONOSCOPY    . LAPAROTOMY N/A 05/31/2016   Procedure: EXPLORATORY LAPAROTOMY;  Surgeon: Everitt Amber, MD;  [facility];  Service: Gynecology;  Laterality: N/A;  . OMENTECTOMY  05/31/2016   Procedure: OMENTECTOMY AND STAGING FOR OVARIAN CANCER;  Surgeon: Everitt Amber, MD;  [facility];  Service:  Gynecology;;  . SALPINGOOPHORECTOMY Right 05/31/2016   Procedure: BILATERAL SALPINGO OOPHORECTOMY;  Surgeon: Everitt Amber, MD;  [facility];  Service: Gynecology;  Laterality: Right;  . TONSILLECTOMY      There were no vitals filed for this visit.       Subjective Assessment - 02/07/17 1107    Subjective I had seen you last April for left shoulder pain and am much better, but now I am dealing with right shoulder blade pain.  I have been doing yoga with weights and I tried to stop but pain did not improved.  I am continuing to run without problems.   Pertinent History ovarian CA with 6 month followship.  Hernia on right side.   Limitations Other (comment)  deep breathing. coughing,  child's pose   Patient Stated Goals Pt unable to fully participate in yoga, sleep on back, deeply breath or rolling over scapula. She wants to be able to do allthose things   Currently in Pain? Yes   Pain Score 5    Pain Location Shoulder  shoulder blade medical   Pain Orientation Right;Medial   Pain Descriptors / Indicators Sharp   Pain Type Chronic pain  since may   Pain Onset More than a month ago   Pain Frequency Intermittent   Aggravating Factors  rolling on back over medial scapula, deeep breathing,  sleeping on my back, childs pose position   Pain Relieving Factors TDN            [facility] PT Assessment - 02/07/17 1112      Assessment  Medical Diagnosis right medial shoulder pain   Referring Provider [place]. Zoe   Onset Date/Surgical Date --  2 months ago   Hand Dominance Right   Prior Therapy Yes before for left frozen shoulder     Precautions   Precautions None   Precaution Comments Has had Ovarian Cancer     Restrictions   Weight Bearing Restrictions No     Balance Screen   Has the patient fallen in the past 6 months No   Has the patient had a decrease in activity level because of a fear of falling?  No   Is the patient reluctant to leave their home because of a fear of  falling?  No     Home Environment   Living Environment Private residence   Living Arrangements Spouse/significant other   Type of [place] Access Stairs to enter   Entrance Stairs-Number of Steps 0   Home Layout Multi-level   Alternate Level Stairs-Number of Steps 7  x 2   Alternate Level Stairs-Rails Can reach both     Prior Function   Level of Independence Independent     Cognition   Overall Cognitive Status Within Functional Limits for tasks assessed     Observation/Other Assessments   Focus on Therapeutic Outcomes (FOTO)  FOTO intake 81% limtation 19%  predicted 23%     Posture/Postural Control   Posture/Postural Control Postural limitations   Postural Limitations Forward head   Posture Comments slight forward shoulders     ROM / Strength   AROM / PROM / Strength AROM;Strength     AROM   Overall AROM  Deficits   Right Shoulder Flexion 164 Degrees   Right Shoulder ABduction 160 Degrees   Right Shoulder Internal Rotation 44 Degrees   Right Shoulder External Rotation 80 Degrees   Left Shoulder Flexion 150 Degrees   Left Shoulder ABduction 148 Degrees   Left Shoulder Internal Rotation 52 Degrees   Left Shoulder External Rotation 95 Degrees   Thoracic Flexion 49   Thoracic Extension 8   Thoracic - Right Side Bend 40   Thoracic - Left Side Bend 32   Thoracic - Right Rotation 90%   Thoracic - Left Rotation 75%     Strength   Overall Strength Deficits   Overall Strength Comments grossly 5/5 except scapular stabilizers.  4 to 4+/5            Objective measurements completed on examination: See above findings.          [place] Adult PT Treatment/Exercise - 02/07/17 1112      Manual Therapy   Manual Therapy Soft tissue mobilization;Joint mobilization   Joint Mobilization thoracic PA mobs grade 3 T- 4 to T- 9 and rib junction T-5, 6 , 7    Soft tissue mobilization soft tissue of right medial scapula and serratus anterior.          Trigger  Point Dry Needling - 02/07/17 1129    Consent Given? Yes   Education Handout Provided Yes   Muscles Treated Upper Body Longissimus  right serratus   Longissimus Response Twitch response elicited;Palpable increased muscle length  right only              PT Education - 02/07/17 1128    Education provided Yes   Education Details trigger point dry needling and serratus exercises POC and explanation of findings   Person(s) Educated Patient   Methods Explanation;Demonstration;Tactile cues;Verbal cues;Handout   Comprehension  Verbalized understanding;Returned demonstration          PT Short Term Goals - 02/07/17 1839      PT SHORT TERM GOAL #1   Title STG=LTG           PT Long Term Goals - 02/07/17 1839      PT LONG TERM GOAL #1   Title Pt will be independent with advanced HEP   Time 6   Period Weeks   Status New   Target Date 03/21/17     PT LONG TERM GOAL #2   Title "FOTO will improve from 19% limitation   to 4 % limitation    indicating improved functional mobility   Time 6   Period Weeks   Status New   Target Date 03/21/17     PT LONG TERM GOAL #3   Title Pt will be able to deeply breath and participate in yoga without exacerbating medial right scapula pain.   Time 6   Period Weeks   Status New   Target Date 03/21/17     PT LONG TERM GOAL #4   Title Pt will be able to roll over and sleep on back without awakening due to pain at night for more restorative sleep   Time 6   Period Weeks   Status New   Target Date 03/21/17     PT LONG TERM GOAL #5   Title Pt will be educated on proper posture and use of free weights for exercises and for injury prevention   Time 6   Period Weeks   Status New   Target Date 03/21/17                Plan - 02/07/17 1846    Clinical Impression Statement 61 yo female familiar to this clinician for former left frozen shoulder returns to clinic with medial scapular pain and inability to participate in  leisure/recreational actiivities, breath deeply , participate in some positions in yoga and lie on back or roll over at night due to ROM , weakness and postural limitation.  She also has difficulty with sleeping due to pain at night. and serratus anterior left weakness.  Pt would benefit from skilled PT to address these deficits   Clinical Presentation Stable   Clinical Decision Making Low   Rehab Potential Excellent   PT Frequency 1x / week   PT Duration 6 weeks   PT Treatment/Interventions Taping;Dry needling;Passive range of motion;Manual techniques;Therapeutic exercise;Neuromuscular re-education;Patient/family education;Electrical Stimulation;Cryotherapy;Iontophoresis 4mg /ml Dexamethasone;Moist Heat;Ultrasound   PT Next Visit Plan trigger point dry needling education and initial posture   PT Home Exercise Plan isometric extension of shoulder. low row for serratus anterior   Consulted and Agree with Plan of Care Patient      Patient will benefit from skilled therapeutic intervention in order to improve the following deficits and impairments:  Postural dysfunction, Improper body mechanics, Pain, Decreased range of motion, Decreased strength  Visit Diagnosis: Chronic right shoulder pain  Chronic right-sided thoracic back pain  Abnormal posture     Problem List Patient Active Problem List   Diagnosis Date Noted  . Rectocele 11/19/2016  . Nodule of left lung 11/19/2016  . Genetic testing 09/28/2016  . Ovarian cancer on right ([place]) 05/31/2016  . Ovarian cancer ([place]) 05/31/2016  . Pelvic mass 05/14/2016  . Hyperlipidemia 11/07/2012  . Fatigue 11/07/2012  . Family history of early CAD 11/07/2012    Voncille Lo, PT Certified Exercise Expert for the Aging Adult  02/07/17 6:55 PM Phone: [phone] Fax: [facility] [address] [place], [place], [address] Phone: [phone]   Fax:  [phone]  Name:  Theresa Frazier MRN: [id number] Date of Birth: 01-14-1956

## 2017-02-07 NOTE — Patient Instructions (Signed)
Trigger Point Dry Needling  . What is Trigger Point Dry Needling (DN)? o DN is a physical therapy technique used to treat muscle pain and dysfunction. Specifically, DN helps deactivate muscle trigger points (muscle knots).  o A thin filiform needle is used to penetrate the skin and stimulate the underlying trigger point. The goal is for a local twitch response (LTR) to occur and for the trigger point to relax. No medication of any kind is injected during the procedure.   . What Does Trigger Point Dry Needling Feel Like?  o The procedure feels different for each individual patient. Some patients report that they do not actually feel the needle enter the skin and overall the process is not painful. Very mild bleeding may occur. However, many patients feel a deep cramping in the muscle in which the needle was inserted. This is the local twitch response.   [place] Kitchen How Will I feel after the treatment? o Soreness is normal, and the onset of soreness may not occur for a few hours. Typically this soreness does not last longer than two days.  o Bruising is uncommon, however; ice can be used to decrease any possible bruising.  o In rare cases feeling tired or nauseous after the treatment is normal. In addition, your symptoms may get worse before they get better, this period will typically not last longer than 24 hours.   . What Can I do After My Treatment? o Increase your hydration by drinking more water for the next 24 hours. o You may place ice or heat on the areas treated that have become sore, however, do not use heat on inflamed or bruised areas. Heat often brings more relief post needling. o You can continue your regular activities, but vigorous activity is not recommended initially after the treatment for 24 hours. o DN is best combined with other physical therapy such as strengthening, stretching, and other therapies.   Strengthening: Isometric Extension    Using wall for resistance, press back of  left arm into ball using light pressure.  Use straight arm as well with wrist pushed into wall.  Try to bring corner of scapula into back pocket Hold __5__ seconds. Repeat __15__ times per set. Do _1___ sets per session. Do __3__ sessions per day.  http://orth.exer.us/805   Copyright  VHI. All rights reserved.    Voncille Lo, PT Certified Exercise Expert for the Aging Adult  02/07/17 11:25 AM Phone: [phone] Fax: [phone]

## 2017-02-07 NOTE — Telephone Encounter (Signed)
Pt is calling to see if someone could go over her lab results with her.  She sees them through [facility] but she is having a hard time understanding them.  Please advise

## 2017-02-08 NOTE — Telephone Encounter (Signed)
Lab results reviewed and given to patient Concerned about cholesterol and glucose labs Advised glucose is wnr, watch fatty foods, oils, butter and exercise to prevent heart disease.  Verbalized understanding

## 2017-02-12 DIAGNOSIS — D1800 Hemangioma unspecified site: Secondary | ICD-10-CM | POA: Diagnosis not present

## 2017-02-12 DIAGNOSIS — L821 Other seborrheic keratosis: Secondary | ICD-10-CM | POA: Diagnosis not present

## 2017-02-12 DIAGNOSIS — D225 Melanocytic nevi of trunk: Secondary | ICD-10-CM | POA: Diagnosis not present

## 2017-02-12 DIAGNOSIS — Z23 Encounter for immunization: Secondary | ICD-10-CM | POA: Diagnosis not present

## 2017-02-12 DIAGNOSIS — L814 Other melanin hyperpigmentation: Secondary | ICD-10-CM | POA: Diagnosis not present

## 2017-02-14 ENCOUNTER — Ambulatory Visit: Payer: BLUE CROSS/BLUE SHIELD | Admitting: Physical Therapy

## 2017-02-14 ENCOUNTER — Encounter: Payer: Self-pay | Admitting: Physical Therapy

## 2017-02-14 DIAGNOSIS — M546 Pain in thoracic spine: Secondary | ICD-10-CM | POA: Diagnosis not present

## 2017-02-14 DIAGNOSIS — G8929 Other chronic pain: Secondary | ICD-10-CM | POA: Diagnosis not present

## 2017-02-14 DIAGNOSIS — M25511 Pain in right shoulder: Principal | ICD-10-CM

## 2017-02-14 DIAGNOSIS — R293 Abnormal posture: Secondary | ICD-10-CM

## 2017-02-14 NOTE — Therapy (Signed)
[place], [place], [address] Phone: [phone]   Fax:  [phone]  Physical Therapy Treatment  Patient Details  Name: Theresa Frazier MRN: [id number] Date of Birth: 1955/07/06 Referring Provider: Nolon Rod. Zoe  Encounter Date: 02/14/2017      PT End of Session - 02/14/17 1506    Visit Number 2   Number of Visits 7   Date for PT Re-Evaluation 03/21/17   Authorization Type BCBS   Authorization Time Period 03-21-17   PT Start Time 1500   PT Stop Time 1556   PT Time Calculation (min) 56 min   Activity Tolerance Patient tolerated treatment well   Behavior During Therapy WFL for tasks assessed/performed      Past Medical History:  Diagnosis Date  . 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 Dyslipidemia   . Family history of early CAD   . Genetic testing 09/28/2016   Genetic testing revealed ONE pathogenic mutation in [place]. Though a pathogenic mutation was identified, increased risk for cancer is only associated with TWO (biallelic) pathogenic mutations. No additional mutations or variants of uncertain significance were identified. Testing was performed through [employer]'s 46-gene Common Hereditary Cancers Panel. [employer]'s Common Hereditary Cancers Panel includ  . GERD (gastroesophageal reflux disease)     Past Surgical History:  Procedure Laterality Date  . ABDOMINAL HYSTERECTOMY Left 05/31/2016   Procedure: HYSTERECTOMY ABDOMINAL;  Surgeon: Everitt Amber, MD;  Location: [facility];  Service: Gynecology;  Laterality: Left;  . BREAST SURGERY  1993   left breast-calcification  . COLONOSCOPY    . LAPAROTOMY N/A 05/31/2016   Procedure: EXPLORATORY LAPAROTOMY;  Surgeon: Everitt Amber, MD;  Location: [facility];  Service: Gynecology;  Laterality: N/A;  . OMENTECTOMY  05/31/2016   Procedure: OMENTECTOMY AND STAGING FOR OVARIAN CANCER;  Surgeon: Everitt Amber, MD;  Location: [facility];  Service:  Gynecology;;  . SALPINGOOPHORECTOMY Right 05/31/2016   Procedure: BILATERAL SALPINGO OOPHORECTOMY;  Surgeon: Everitt Amber, MD;  Location: [facility];  Service: Gynecology;  Laterality: Right;  . TONSILLECTOMY      There were no vitals filed for this visit.      Subjective Assessment - 02/14/17 1507    Subjective Today I am 10% better . right afterwards last time I was running and I felt pain in the chest while I was breathing but it resolved. ran/ walk for 2.5 miles.   Pertinent History ovarian CA with 6 month followship.  Hernia on right side.   Limitations Other (comment)  deep breathing, child pose , yoga   Patient Stated Goals Pt unable to fully participate in yoga, sleep on back, deeply breath or rolling over scapula. She wants to be able to do allthose things   Currently in Pain? Yes   Pain Location Shoulder  inferior angle, over ribs/thoracic   Pain Orientation Right;Medial   Pain Descriptors / Indicators Sharp   a little less sharp   Pain Type Chronic pain   Pain Onset More than a month ago   Pain Frequency Intermittent                         [facility] Adult PT Treatment/Exercise - 02/14/17 1512      Self-Care   Self-Care Posture   Posture sitting and standing posture explanation     Shoulder Exercises: Standing   External Rotation Strengthening;Both;15 reps;Theraband   Theraband Level (Shoulder External Rotation) Level 3 (  Green)   External Rotation Limitations x 2   Extension Strengthening;Both;15 reps;Theraband   Theraband Level (Shoulder Extension) Level 3 (Green)   Extension Limitations x2   Row Strengthening;Both;Theraband;15 reps   Theraband Level (Shoulder Row) Level 3 (Green)   Row Limitations x2   Other Standing Exercises standing extension shoulder isometric x 15   Vc for correct execution     Modalities   Modalities Moist Heat     Moist Heat Therapy   Number Minutes Moist Heat 15 Minutes   Moist Heat Location Lumbar Spine  right thoracic      Manual Therapy   Manual Therapy Soft tissue mobilization;Joint mobilization   Joint Mobilization thoracic PA mobs grade 3 T- 4 to T- 9 and rib junction T-5, 6 , 7    Soft tissue mobilization soft tissue of right medial scapula and serratus anterior. thoracic paraspinals bil          Trigger Point Dry Needling - 02/14/17 1525    Consent Given? Yes   Education Handout Provided --  previously given   Muscles Treated Upper Body Longissimus;Subscapularis  right side only   Subscapularis Response Twitch response elicited;Palpable increased muscle length   Longissimus Response Twitch response elicited;Palpable increased muscle length  right only              PT Education - 02/14/17 1520    Education provided Yes   Education Details added to HEP standing scapular, ext, row and ER bil with green t band   Person(s) Educated Patient   Methods Explanation;Demonstration;Tactile cues;Verbal cues;Handout   Comprehension Verbalized understanding;Returned demonstration          PT Short Term Goals - 02/07/17 1839      PT SHORT TERM GOAL #1   Title STG=LTG           PT Long Term Goals - 02/14/17 1555      PT LONG TERM GOAL #1   Title Pt will be independent with advanced HEP   Baseline given intitial HEP   Period Weeks     PT LONG TERM GOAL #2   Title "FOTO will improve from 19% limitation   to 4 % limitation    indicating improved functional mobility   Time 6   Period Weeks   Status Unable to assess     PT LONG TERM GOAL #3   Title Pt will be able to deeply breath and participate in yoga without exacerbating medial right scapula pain.   Baseline better able to breath . 10 % better   Time 6   Period Weeks   Status On-going     PT LONG TERM GOAL #4   Title Pt will be able to roll over and sleep on back without awakening due to pain at night for more restorative sleep   Time 6   Period Weeks   Status Unable to assess     PT LONG TERM GOAL #5   Title Pt will  be educated on proper posture and use of free weights for exercises and for injury prevention   Baseline initial posture education   Time 6   Period Weeks   Status On-going               Plan - 02/14/17 1551    Clinical Impression Statement Pt felt relief of right subscapular/thoracic paraspinal pain.  Last time pt had pain when running at night after RX but resolved.  Pt reports 10 % better than  on evaluation.  Pt was educated on proper posture sitting and standing and given initial scapular stabilizer exercises.  Pt consented for TDN and was closely monitored throughout session   Rehab Potential Excellent   PT Frequency 1x / week   PT Duration 6 weeks   PT Treatment/Interventions Taping;Dry needling;Passive range of motion;Manual techniques;Therapeutic exercise;Neuromuscular re-education;Patient/family education;Electrical Stimulation;Cryotherapy;Iontophoresis 4mg /ml Dexamethasone;Moist Heat;Ultrasound   PT Next Visit Plan Assess dry needling add core clams, bridging assess TDN benefit   PT Home Exercise Plan isometric extension of shoulder. low row for serratus anterior, standing scapular stabilizers   Consulted and Agree with Plan of Care Patient      Patient will benefit from skilled therapeutic intervention in order to improve the following deficits and impairments:  Postural dysfunction, Improper body mechanics, Pain, Decreased range of motion, Decreased strength  Visit Diagnosis: Chronic right shoulder pain  Chronic right-sided thoracic back pain  Abnormal posture     Problem List Patient Active Problem List   Diagnosis Date Noted  . Rectocele 11/19/2016  . Nodule of left lung 11/19/2016  . Genetic testing 09/28/2016  . Ovarian cancer on right ([place]) 05/31/2016  . Ovarian cancer ([place]) 05/31/2016  . Pelvic mass 05/14/2016  . Hyperlipidemia 11/07/2012  . Fatigue 11/07/2012  . Family history of early CAD 11/07/2012    Voncille Lo, PT Certified Exercise  Expert for the Aging Adult  02/14/17 3:58 PM Phone: [phone] Fax: [facility] [address] [place], [place], [address] Phone: [phone]   Fax:  [phone]  Name: DAMIAH MCDONALD MRN: [id number] Date of Birth: Jun 26, 1955

## 2017-02-14 NOTE — Patient Instructions (Addendum)
Posture Tips DO: - stand tall and erect - keep chin tucked in - keep head and shoulders in alignment - check posture regularly in mirror or large window - pull head back against headrest in car seat;  Change your position often.  Sit with lumbar support. DON'T: - slouch or slump while watching TV or reading - sit, stand or lie in one position  for too long;  Sitting is especially hard on the spine so if you sit at a desk/use the computer, then stand up often!   Copyright  VHI. All rights reserved.  Posture - Standing   Good posture is important. Avoid slouching and forward head thrust. Maintain curve in low back and align ears over shoul- ders, hips over ankles.  Pull your belly button in toward your back bone. Standing with ribs lifted up and chin down.  Only bend or hip hinge.  Try to keep your back quiet when rising from chair or bed anchoring your sacrum to the chair or elongate   Copyright  VHI. All rights reserved.  Posture - Sitting   Sit upright, head facing forward. Try using a roll to support lower back. Keep shoulders relaxed, and avoid rounded back. Keep hips level with knees. Avoid crossing legs for long periods.  Sit on sit bones, not tail bone,  All the way back into chair.  Do not perch on edge of seat   Copyright  VHI. All rights reserved.   Please do 15 x 2 at one time or split 1/2 and 1/2 during the day.  Voncille Lo, PT Certified Exercise Expert for the Aging Adult  02/14/17 3:20 PM Phone: [phone] Fax: [phone]

## 2017-02-18 ENCOUNTER — Other Ambulatory Visit (HOSPITAL_BASED_OUTPATIENT_CLINIC_OR_DEPARTMENT_OTHER): Payer: BLUE CROSS/BLUE SHIELD

## 2017-02-18 DIAGNOSIS — C569 Malignant neoplasm of unspecified ovary: Secondary | ICD-10-CM

## 2017-02-18 LAB — CEA (IN HOUSE-CHCC): CEA (CHCC-IN HOUSE): 1.04 ng/mL (ref 0.00–5.00)

## 2017-02-19 ENCOUNTER — Other Ambulatory Visit: Payer: BLUE CROSS/BLUE SHIELD

## 2017-02-19 LAB — CA 125: Cancer Antigen (CA) 125: 14.1 U/mL (ref 0.0–38.1)

## 2017-02-21 ENCOUNTER — Ambulatory Visit: Payer: BLUE CROSS/BLUE SHIELD | Admitting: Physical Therapy

## 2017-02-21 DIAGNOSIS — R293 Abnormal posture: Secondary | ICD-10-CM

## 2017-02-21 DIAGNOSIS — G8929 Other chronic pain: Secondary | ICD-10-CM | POA: Diagnosis not present

## 2017-02-21 DIAGNOSIS — M546 Pain in thoracic spine: Secondary | ICD-10-CM

## 2017-02-21 DIAGNOSIS — M25511 Pain in right shoulder: Secondary | ICD-10-CM | POA: Diagnosis not present

## 2017-02-21 NOTE — Therapy (Signed)
Theresa Frazier, [place], [address] Phone: [phone]   Fax:  [phone]  Physical Therapy Treatment  Patient Details  Name: Theresa Frazier MRN: [id number] Date of Birth: 1956-04-17 Referring Provider: Nolon Rod. Frazier  Encounter Date: 02/21/2017      PT End of Session - 02/21/17 1234    Visit Number 3   Number of Visits 7   Date for PT Re-Evaluation 03/21/17   Authorization Type BCBS   Authorization Time Period 03-21-17   PT Start Time 1147   PT Stop Time 1240   PT Time Calculation (min) 53 min   Activity Tolerance Patient tolerated treatment well   Behavior During Therapy WFL for tasks assessed/performed      Past Medical History:  Diagnosis Date  . 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 Dyslipidemia   . Family history of early CAD   . 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 Though a pathogenic mutation was identified, increased risk for cancer is only associated with TWO (biallelic) pathogenic mutations. No additional mutations or variants of uncertain significance were identified. Testing was performed through [employer]'s 46-gene Common Hereditary Cancers Panel. [employer]'s Common Hereditary Cancers Panel includ  . GERD (gastroesophageal reflux disease)     Past Surgical History:  Procedure Laterality Date  . ABDOMINAL HYSTERECTOMY Left 05/31/2016   Procedure: HYSTERECTOMY ABDOMINAL;  Surgeon: Theresa Amber, MD;  Location: [facility];  Service: Gynecology;  Laterality: Left;  . BREAST SURGERY  1993   left breast-calcification  . COLONOSCOPY    . LAPAROTOMY N/A 05/31/2016   Procedure: EXPLORATORY LAPAROTOMY;  Surgeon: Theresa Amber, MD;  Location: [facility];  Service: Gynecology;  Laterality: N/A;  . OMENTECTOMY  05/31/2016   Procedure: OMENTECTOMY AND STAGING FOR OVARIAN CANCER;  Surgeon: Theresa Amber, MD;  Location: [facility];  Service:  Gynecology;;  . SALPINGOOPHORECTOMY Right 05/31/2016   Procedure: BILATERAL SALPINGO OOPHORECTOMY;  Surgeon: Theresa Amber, MD;  Location: [facility];  Service: Gynecology;  Laterality: Right;  . TONSILLECTOMY      There were no vitals filed for this visit.      Subjective Assessment - 02/21/17 1212    Subjective I feel about 50% better today.  I am able to run without pain now and roll over on my back without pain   Pertinent History ovarian CA with 6 month followship.  Hernia on right side.   Limitations Other (comment)   Patient Stated Goals Pt unable to fully participate in yoga, sleep on back, deeply breath or rolling over scapula. She wants to be able to do allthose things   Pain Score 3    Pain Location Shoulder   Pain Orientation Right;Medial   Pain Descriptors / Indicators Sharp   Pain Type Chronic pain   Pain Onset More than a month ago                         [facility] Adult PT Treatment/Exercise - 02/21/17 1230      Self-Care   Self-Care Other Self-Care Comments   Other Self-Care Comments  discussed communty wellness opportuniities     Shoulder Exercises: Seated   Other Seated Exercises UBE level 3 2 min forward and 2 min backward with VC for good posture     Shoulder Exercises: Standing   Other Standing Exercises standing with Free motion machine palloff press right and left x  15 each.  chopping and lifting x 15 each  10 lb for all     Modalities   Modalities Moist Heat     Moist Heat Therapy   Number Minutes Moist Heat 15 Minutes   Moist Heat Location Lumbar Spine  right thoracic     Manual Therapy   Manual Therapy Soft tissue mobilization   Joint Mobilization thoracic PA mobs grade 3 T- 4 to T- 9 and rib junction T-5, 6 , 7    Soft tissue mobilization soft tissue of right medial scapula and serratus anterior. thoracic paraspinals bil          Trigger Point Dry Needling - 02/21/17 1214    Consent Given? Yes   Education Handout Provided --   yes verbal   Muscles Treated Upper Body Longissimus  right side only for TDN rib junction 5,6 7 twitch marked Rig   Subscapularis Response Twitch response elicited;Palpable increased muscle length   Longissimus Response Twitch response elicited;Palpable increased muscle length                PT Short Term Goals - 02/07/17 1839      PT SHORT TERM GOAL #1   Title STG=LTG           PT Long Term Goals - 02/21/17 1220      PT LONG TERM GOAL #1   Title Pt will be independent with advanced HEP   Baseline given intitial HEP, continuning exercises  Performed exercises palloff press at 10 lb   Time 6   Period Weeks   Status On-going     PT LONG TERM GOAL #2   Title "FOTO will improve from 19% limitation   to 4 % limitation    indicating improved functional mobility   Time 6   Period Weeks   Status Unable to assess     PT LONG TERM GOAL #3   Title Pt will be able to deeply breath and participate in yoga without exacerbating medial right scapula pain.   Baseline 50% better, better able to breath  was able to run without exacerbating pain   Time 6   Period Weeks   Status On-going     PT LONG TERM GOAL #4   Title Pt will be able to roll over and sleep on back without awakening due to pain at night for more restorative sleep   Baseline Pt now able to roll over and  and sleep without pain and not awakening   Time 6   Period Weeks   Status Achieved     PT LONG TERM GOAL #5   Title Pt will be educated on proper posture and use of free weights for exercises and for injury prevention   Baseline initial posture education, used gym equipment initial education   Time 6   Period Weeks   Status On-going               Plan - 02/21/17 1227    Clinical Impression Statement Pt reports that she is 50% better and that she can run without pain now.  LTG# 4 achieved.  [name] is able to roll over on back without pain and not wakening due to pain.  Pt is looking at  various gym programs to attend after skilled PT ends.  Making excellent progress   Rehab Potential Excellent   PT Frequency 1x / week   PT Duration 6 weeks   PT Treatment/Interventions Taping;Dry needling;Passive range of  motion;Manual techniques;Therapeutic exercise;Neuromuscular re-education;Patient/family education;Electrical Stimulation;Cryotherapy;Iontophoresis 4mg /ml Dexamethasone;Moist Heat;Ultrasound   PT Next Visit Plan ADL/posture and working with gym equipment   PT Home Exercise Plan isometric extension of shoulder. low row for serratus anterior, standing scapular stabilizers, supine scapular stabilizers   Consulted and Agree with Plan of Care Patient      Patient will benefit from skilled therapeutic intervention in order to improve the following deficits and impairments:  Postural dysfunction, Improper body mechanics, Pain, Decreased range of motion, Decreased strength  Visit Diagnosis: Chronic right shoulder pain  Chronic right-sided thoracic back pain  Abnormal posture     Problem List Patient Active Problem List   Diagnosis Date Noted  . Rectocele 11/19/2016  . Nodule of left lung 11/19/2016  . Genetic testing 09/28/2016  . Ovarian cancer on right ([place]) 05/31/2016  . Ovarian cancer ([place]) 05/31/2016  . Pelvic mass 05/14/2016  . Hyperlipidemia 11/07/2012  . Fatigue 11/07/2012  . Family history of early CAD 11/07/2012   Voncille Lo, PT Certified Exercise Expert for the Aging Adult  02/21/17 12:35 PM Phone: [phone] Fax: [facility] [address] [place], [place], [address] Phone: [phone]   Fax:  [phone]  Name: Theresa Frazier MRN: [id number] Date of Birth: [birth year]

## 2017-02-22 ENCOUNTER — Encounter: Payer: Self-pay | Admitting: Gynecologic Oncology

## 2017-02-22 ENCOUNTER — Ambulatory Visit: Payer: BLUE CROSS/BLUE SHIELD | Attending: Gynecologic Oncology | Admitting: Gynecologic Oncology

## 2017-02-22 VITALS — BP 153/67 | HR 72 | Temp 98.2°F | Resp 20 | Ht 62.0 in | Wt 157.1 lb

## 2017-02-22 DIAGNOSIS — Z8249 Family history of ischemic heart disease and other diseases of the circulatory system: Secondary | ICD-10-CM | POA: Insufficient documentation

## 2017-02-22 DIAGNOSIS — Z79899 Other long term (current) drug therapy: Secondary | ICD-10-CM | POA: Diagnosis not present

## 2017-02-22 DIAGNOSIS — Z8543 Personal history of malignant neoplasm of ovary: Secondary | ICD-10-CM

## 2017-02-22 DIAGNOSIS — K219 Gastro-esophageal reflux disease without esophagitis: Secondary | ICD-10-CM | POA: Diagnosis not present

## 2017-02-22 DIAGNOSIS — C561 Malignant neoplasm of right ovary: Secondary | ICD-10-CM | POA: Diagnosis not present

## 2017-02-22 DIAGNOSIS — C569 Malignant neoplasm of unspecified ovary: Secondary | ICD-10-CM

## 2017-02-22 DIAGNOSIS — Z808 Family history of malignant neoplasm of other organs or systems: Secondary | ICD-10-CM | POA: Insufficient documentation

## 2017-02-22 DIAGNOSIS — E785 Hyperlipidemia, unspecified: Secondary | ICD-10-CM | POA: Insufficient documentation

## 2017-02-22 DIAGNOSIS — R911 Solitary pulmonary nodule: Secondary | ICD-10-CM

## 2017-02-22 DIAGNOSIS — K469 Unspecified abdominal hernia without obstruction or gangrene: Secondary | ICD-10-CM | POA: Insufficient documentation

## 2017-02-22 DIAGNOSIS — Z9889 Other specified postprocedural states: Secondary | ICD-10-CM | POA: Diagnosis not present

## 2017-02-22 DIAGNOSIS — K436 Other and unspecified ventral hernia with obstruction, without gangrene: Secondary | ICD-10-CM | POA: Diagnosis not present

## 2017-02-22 DIAGNOSIS — Z806 Family history of leukemia: Secondary | ICD-10-CM | POA: Insufficient documentation

## 2017-02-22 DIAGNOSIS — Z8042 Family history of malignant neoplasm of prostate: Secondary | ICD-10-CM | POA: Insufficient documentation

## 2017-02-22 DIAGNOSIS — N816 Rectocele: Secondary | ICD-10-CM

## 2017-02-22 DIAGNOSIS — Z90710 Acquired absence of both cervix and uterus: Secondary | ICD-10-CM | POA: Diagnosis not present

## 2017-02-22 DIAGNOSIS — Z87891 Personal history of nicotine dependence: Secondary | ICD-10-CM | POA: Insufficient documentation

## 2017-02-22 NOTE — Progress Notes (Signed)
Follow-up Note: Gyn-Onc  Consult was requested by Dr. Marisue Humble and Dr Laurann Montana for the evaluation of Theresa Frazier 61 y.o. female  CC:  Chief Complaint  Patient presents with  . Malignant neoplasm of ovary, unspecified laterality [facility])    Assessment/Plan:  Theresa Frazier  is a 61 y.o.  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   I discussed the risk for recurrence associated with this disease. I discussed symptoms consistent with recurrence and for her to notify me of these should they occur.  I recommend 3 monthly evaluations with the next in September, 2018 with CEA and CA 125.  Rectocele and vaginal dryness - continue vaginal premarin for dryness symptoms.   75mm right lung apical nodule - recheck June, 2019.  Hernia - asymptomatic. Offered surgery referral. Declined.  HPI: Theresa Frazier is a 61 year old parous (P2) woman who is seen in consultation at the request of Dr Marisue Humble and Dr Laurann Montana for a complex cystic and solid pelvic mass.  The patient reported to her PCP's that she had abdominal bloating and fullness for 6-8 weeks since October 2017 when she saw them on 04/25/16. They performed a pelvic examination on that visit and appreciated a pelvic mass. A TVUS was ordered and performed on 05/04/16 and revealed a uterus measuring 8.4x3.7x4.8cm with a 7cm endometrial stripe (she has had no bleeding symptoms) and the ovaries were not discretely seen. Instead, there is a large complex cystic and solid mass at midline, extending into the right and left adnexum with internal blood flow. The mass measures 11 x 7.4 x 14.1 cm. Separate ovaries are not identified.  A CT scan had been ordered but canceled upon the US findings and plan for consultation with me.  She is feeling fairly well. Her pants fit differently lately and she feels she may have lost weight. Her bladder and bowels are moving normally.  She denies pain.  She has no  family history for breast/ovarian cancer or any other significant cancer history.  Her surgical history is significant for tonsillectomy only (no abdominal surgery).  CT scan on 05/16/16 showed: large (15cm) complex mass in the central anatomic pelvis appears to arise from the right ovary and is highly worrisome for a cystic ovarian carcinoma and a small amount of pelvic free fluid. CA 125 on 05/14/16 was elevated at 72.  On 05/31/16 she went to the OR for an exploratory laparotomy, TAH, BSO, omentectomy and peritoneal biopsies (no lymphadenectomy due to frozen section showing mucinous cell type).Intraoperative findings were significant for a 15cm right ovarian mass non-adherent, and otherwise normal peritoneal cavity and appendix. Frozen showed mucinous adencarcinoma.  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  Interval Hx: Since surgery she has been feeling very well.  She has had twinges of pain in her posterior right back/thorax when she is working out.  She has vaginal dryness and a bulging symptom in the vagina with exercise (strenuous).  CA 125 on 11/15/16 was normal at 11.9 CEA on 11/15/16 was normal at <1  Current Meds:  Outpatient Encounter Prescriptions as of 02/22/2017  Medication Sig  . APPLE CIDER VINEGAR PO Take 2 mLs by mouth.  . conjugated estrogens (PREMARIN) vaginal cream Place 1 Applicatorful vaginally 3 (three) times a week.  . hydroxypropyl methylcellulose / hypromellose (ISOPTO TEARS / GONIOVISC) 2.5 % ophthalmic solution Place 1-2 drops into  both eyes 3 (three) times daily as needed for dry eyes.  . Multiple Vitamin (MULTIVITAMIN WITH MINERALS) TABS tablet Take 1 tablet by mouth daily.  . NON FORMULARY Patient uses essential oils as needed   No facility-administered encounter medications on file as of 02/22/2017.     Allergy: Not on File  Social Hx:   Social  History   Social History  . Marital status: Married    Spouse name: N/A  . Number of children: N/A  . Years of education: N/A   Occupational History  . Not on file.   Social History Main Topics  . Smoking status: Former Smoker    Years: 7.00    Types: Cigarettes    Quit date: 11/07/1985  . Smokeless tobacco: Never Used  . Alcohol use No  . Drug use: No  . Sexual activity: Not on file   Other Topics Concern  . Not on file   Social History Narrative  . No narrative on file    Past Surgical Hx:  Past Surgical History:  Procedure Laterality Date  . ABDOMINAL HYSTERECTOMY Left 05/31/2016   Procedure: HYSTERECTOMY ABDOMINAL;  Surgeon: Everitt Amber, MD;  Location: [facility];  Service: Gynecology;  Laterality: Left;  . BREAST SURGERY  1993   left breast-calcification  . COLONOSCOPY    . LAPAROTOMY N/A 05/31/2016   Procedure: EXPLORATORY LAPAROTOMY;  Surgeon: Everitt Amber, MD;  Location: [facility];  Service: Gynecology;  Laterality: N/A;  . OMENTECTOMY  05/31/2016   Procedure: OMENTECTOMY AND STAGING FOR OVARIAN CANCER;  Surgeon: Everitt Amber, MD;  Location: [facility];  Service: Gynecology;;  . SALPINGOOPHORECTOMY Right 05/31/2016   Procedure: BILATERAL SALPINGO OOPHORECTOMY;  Surgeon: Everitt Amber, MD;  [facility];  Service: Gynecology;  Laterality: Right;  . TONSILLECTOMY      Past Medical Hx:  Past Medical History:  Diagnosis Date  . 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 Dyslipidemia   . Family history of early CAD   . Genetic testing 09/28/2016   Genetic testing revealed ONE pathogenic mutation in [place]. Though a pathogenic mutation was identified, increased risk for cancer is only associated with TWO (biallelic) pathogenic mutations. No additional mutations or variants of uncertain significance were identified. Testing was performed through [employer]'s 46-gene Common Hereditary Cancers Panel. [employer]'s Common Hereditary Cancers Panel includ  . GERD  (gastroesophageal reflux disease)     Past Gynecological History:  SVD x 2 No LMP recorded. Patient is postmenopausal.  Family Hx:  Family History  Problem Relation Age of Onset  . Pancreatic cancer Mother 60       d.73 - two months after diagnosis  . Addison's disease Maternal Aunt 76  . Leukemia Maternal Uncle 9       d.59 - shortly after diagnosis  . Melanoma Daughter 57    Review of Systems:  Constitutional  Feels well,    ENT Normal appearing ears and nares bilaterally Skin/Breast  No rash, sores, jaundice, itching, dryness Cardiovascular  No chest pain, shortness of breath, or edema  Pulmonary  No cough or wheeze.  Gastro Intestinal  No nausea, vomitting, or diarrhoea. No bright red blood per rectum, no abdominal pain, change in bowel movement, or constipation.  Genito Urinary  No frequency, urgency, dysuria, no bleeding Musculo Skeletal  No myalgia, arthralgia, joint swelling or pain  Neurologic  No weakness, numbness, change in gait,  Psychology  No depression, anxiety, insomnia.   Vitals:  Blood pressure Marland Kitchen)  153/67, pulse 72, temperature 98.2 F (36.8 C), temperature source Oral, resp. rate 20, height 5\' 2"  (1.575 m), weight 157 lb 1.6 oz (71.3 kg), SpO2 100 %.  Physical Exam: WD in NAD Neck  Supple NROM, without any enlargements.  Lymph Node Survey No cervical supraclavicular or inguinal adenopathy Cardiovascular  Pulse normal rate, regularity and rhythm. S1 and S2 normal.  Lungs  Clear to auscultation bilateraly, without wheezes/crackles/rhonchi. Good air movement.  Skin  No rash/lesions/breakdown  Psychiatry  Alert and oriented to person, place, and time  Abdomen  Normoactive bowel sounds, abdomen soft, non-tender and nonobese. Right paramedian incisional hernia - no incarceration or tenderness. Incision is well healed without signs of infection or induration.. Back No CVA tenderness Genito Urinary: vaginal cuff in tact, no lesions. No masses.  She has posterior vaginal wall prolapse (rectocele) to the level of the introitus with bearing down. Rectal  deferred  Extremities  No bilateral cyanosis, clubbing or edema.   Donaciano Eva, MD  02/22/2017, 2:13 PM

## 2017-02-22 NOTE — Patient Instructions (Signed)
Please notify Dr Swayze Pries at phone number [phone] if you notice vaginal bleeding, new pelvic or abdominal pains, bloating, feeling full easy, or a change in bladder or bowel function.   Please return to see Dr Shalom Mcguiness in 3 months. 

## 2017-02-25 DIAGNOSIS — H40013 Open angle with borderline findings, low risk, bilateral: Secondary | ICD-10-CM | POA: Diagnosis not present

## 2017-02-25 DIAGNOSIS — H5203 Hypermetropia, bilateral: Secondary | ICD-10-CM | POA: Diagnosis not present

## 2017-02-28 ENCOUNTER — Encounter: Payer: Self-pay | Admitting: Physical Therapy

## 2017-02-28 ENCOUNTER — Ambulatory Visit: Payer: BLUE CROSS/BLUE SHIELD | Admitting: Physical Therapy

## 2017-02-28 DIAGNOSIS — M25511 Pain in right shoulder: Secondary | ICD-10-CM | POA: Diagnosis not present

## 2017-02-28 DIAGNOSIS — R293 Abnormal posture: Secondary | ICD-10-CM

## 2017-02-28 DIAGNOSIS — G8929 Other chronic pain: Secondary | ICD-10-CM

## 2017-02-28 DIAGNOSIS — M546 Pain in thoracic spine: Secondary | ICD-10-CM | POA: Diagnosis not present

## 2017-02-28 NOTE — Therapy (Signed)
[place], [place], [address] Phone: [phone]   Fax:  [phone]  Physical Therapy Treatment  Patient Details  Name: Theresa Frazier MRN: [id number] Date of Birth: Mar 31, 1956 Referring Provider: Nolon Rod. Zoe  Encounter Date: 02/28/2017      PT End of Session - 02/28/17 1151    Visit Number 4   Number of Visits 7   Date for PT Re-Evaluation 03/21/17   Authorization Type BCBS   Authorization Time Period 03-21-17   PT Start Time 1102   PT Stop Time 1200   PT Time Calculation (min) 58 min   Activity Tolerance Patient tolerated treatment well   Behavior During Therapy WFL for tasks assessed/performed      Past Medical History:  Diagnosis Date  . 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 Dyslipidemia   . Family history of early CAD   . Genetic testing 09/28/2016   Genetic testing revealed ONE pathogenic mutation in [place]. Though a pathogenic mutation was identified, increased risk for cancer is only associated with TWO (biallelic) pathogenic mutations. No additional mutations or variants of uncertain significance were identified. Testing was performed through [employer]'s 46-gene Common Hereditary Cancers Panel. [employer]'s Common Hereditary Cancers Panel includ  . GERD (gastroesophageal reflux disease)     Past Surgical History:  Procedure Laterality Date  . ABDOMINAL HYSTERECTOMY Left 05/31/2016   Procedure: HYSTERECTOMY ABDOMINAL;  Surgeon: Everitt Amber, MD;  [facility];  Service: Gynecology;  Laterality: Left;  . BREAST SURGERY  1993   left breast-calcification  . COLONOSCOPY    . LAPAROTOMY N/A 05/31/2016   Procedure: EXPLORATORY LAPAROTOMY;  Surgeon: Everitt Amber, MD;  [facility];  Service: Gynecology;  Laterality: N/A;  . OMENTECTOMY  05/31/2016   Procedure: OMENTECTOMY AND STAGING FOR OVARIAN CANCER;  Surgeon: Everitt Amber, MD;  [facility];  Service:  Gynecology;;  . SALPINGOOPHORECTOMY Right 05/31/2016   Procedure: BILATERAL SALPINGO OOPHORECTOMY;  Surgeon: Everitt Amber, MD;  [facility];  Service: Gynecology;  Laterality: Right;  . TONSILLECTOMY      There were no vitals filed for this visit.      Subjective Assessment - 02/28/17 1114    Subjective I am about 65% better today.  I love the TDN.  I know this is helping. doing yoga 1 x a week   Pertinent History ovarian CA with 6 month followship.  Hernia on right side.   Limitations Other (comment)  breatthing in deeply   Patient Stated Goals Pt unable to fully participate in yoga, sleep on back, deeply breath or rolling over scapula. She wants to be able to do allthose things   Currently in Pain? Yes   Pain Score 1    Pain Location Shoulder  medial scapular after RX 0/10   Pain Orientation Right;Medial   Pain Descriptors / Indicators Sharp   Pain Type Chronic pain   Pain Onset More than a month ago   Pain Frequency Intermittent                         [facility] Adult PT Treatment/Exercise - 02/28/17 1118      Shoulder Exercises: Supine   Other Supine Exercises supine red t band eccentric IR  right x 15     Shoulder Exercises: Standing   Other Standing Exercises standing at wall with UE flexion right and left x 10 with right foot forward and  again with Left foot forward x 10   Other Standing Exercises standing with Free motion machine palloff press right and left x 15 each.  chopping and lifting x 15 each  13lb for all     Modalities   Modalities Moist Heat     Moist Heat Therapy   Number Minutes Moist Heat 15 Minutes   Moist Heat Location Shoulder  medial scapula and thoracic     Manual Therapy   Manual Therapy Soft tissue mobilization   Joint Mobilization thoracic PA mobs grade 3 T- 4 to T- 9 and rib junction T-5, 6 , 7    Soft tissue mobilization soft tissue of right medial scapula and serratus anterior. thoracic paraspinals bil           Trigger Point Dry Needling - 02/28/17 1124    Consent Given? Yes   Education Handout Provided --  verbally reinforced   Muscles Treated Upper Body Longissimus  right side only ribs 6/7   Subscapularis Response Twitch response elicited;Palpable increased muscle length   Longissimus Response Twitch response elicited;Palpable increased muscle length                PT Short Term Goals - 02/07/17 1839      PT SHORT TERM GOAL #1   Title STG=LTG           PT Long Term Goals - 02/28/17 1130      PT LONG TERM GOAL #1   Title Pt will be independent with advanced HEP   Baseline Pt independent with intial HEP  Progressing strengthe   Time 6   Period Weeks   Status On-going     PT LONG TERM GOAL #2   Title "FOTO will improve from 19% limitation   to 4 % limitation    indicating improved functional mobility   Time 6   Period Weeks   Status Unable to assess     PT LONG TERM GOAL #3   Title Pt will be able to deeply breath and participate in yoga without exacerbating medial right scapula pain.   Baseline 65% better, , better able to breath deeply    Time 6   Period Weeks   Status On-going     PT LONG TERM GOAL #4   Title Pt will be able to roll over and sleep on back without awakening due to pain at night for more restorative sleep   Baseline Pt now able to roll over and  and sleep without pain and not awakening   Period Weeks   Status Achieved     PT LONG TERM GOAL #5   Title Pt will be educated on proper posture and use of free weights for exercises and for injury prevention   Baseline utilizing gym equipment inital educaton   Time 6   Period Weeks   Status On-going               Plan - 02/28/17 1149    Clinical Impression Statement Pt reports she is 65 % better and will be able to move on to a community wellness program after next visit. She is able to demonstrate deep breathing without exacerbation of pain after RX  Pt should be able to complete all  goals after next visit.    Rehab Potential Excellent   PT Frequency 1x / week   PT Duration 6 weeks   PT Treatment/Interventions Taping;Dry needling;Passive range of motion;Manual techniques;Therapeutic exercise;Neuromuscular re-education;Patient/family education;Electrical Stimulation;Cryotherapy;Iontophoresis 4mg /ml Dexamethasone;Moist  Heat;Ultrasound   PT Next Visit Plan Review HEP, FOTO  DC after this visit 5  gym equipment   PT Home Exercise Plan isometric extension of shoulder. low row for serratus anterior, standing scapular stabilizers, supine scapular stabilizers   Consulted and Agree with Plan of Care Patient      Patient will benefit from skilled therapeutic intervention in order to improve the following deficits and impairments:  Postural dysfunction, Improper body mechanics, Pain, Decreased range of motion, Decreased strength  Visit Diagnosis: Chronic right shoulder pain  Chronic right-sided thoracic back pain  Abnormal posture     Problem List Patient Active Problem List   Diagnosis Date Noted  . 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 Rectocele 11/19/2016  . Nodule of apex of right lung 11/19/2016  . Genetic testing 09/28/2016  . Ovarian cancer on right ([place]) 05/31/2016  . Ovarian cancer ([place]) 05/31/2016  . Pelvic mass 05/14/2016  . Hyperlipidemia 11/07/2012  . Fatigue 11/07/2012  . Family history of early CAD 11/07/2012   Voncille Lo, PT Certified Exercise Expert for the Aging Adult  02/28/17 11:53 AM Phone: [phone] Fax: [facility] [address] [place], [place], [address] Phone: [phone]   Fax:  [phone]  Name: ELADIA FRAME MRN: [id number] Date of Birth: Feb 12, 1956

## 2017-03-07 ENCOUNTER — Ambulatory Visit: Payer: BLUE CROSS/BLUE SHIELD | Attending: Family Medicine | Admitting: Physical Therapy

## 2017-03-07 ENCOUNTER — Encounter: Payer: Self-pay | Admitting: Physical Therapy

## 2017-03-07 DIAGNOSIS — G8929 Other chronic pain: Secondary | ICD-10-CM | POA: Diagnosis not present

## 2017-03-07 DIAGNOSIS — M546 Pain in thoracic spine: Secondary | ICD-10-CM | POA: Diagnosis not present

## 2017-03-07 DIAGNOSIS — M25511 Pain in right shoulder: Secondary | ICD-10-CM | POA: Insufficient documentation

## 2017-03-07 NOTE — Therapy (Signed)
[place], [place], [address] Phone: [phone]   Fax:  [phone]  Physical Therapy Treatment/discharge Note  Patient Details  Name: Theresa Frazier MRN: [id number] Date of Birth: [birth year] Referring Provider: Delia Chimes MD  Encounter Date: 03/07/2017      PT End of Session - 03/07/17 1107    Visit Number 5   Number of Visits 7   Date for PT Re-Evaluation 03/21/17   Authorization Type BCBS   Authorization Time Period 03-21-17   PT Start Time 1103   PT Stop Time 1155   PT Time Calculation (min) 52 min   Activity Tolerance Patient tolerated treatment well   Behavior During Therapy [facility] for tasks assessed/performed      Past Medical History:  Diagnosis Date  . 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 Dyslipidemia   . Family history of early CAD   . Genetic testing 09/28/2016   Genetic testing revealed ONE pathogenic mutation in [place]. Though a pathogenic mutation was identified, increased risk for cancer is only associated with TWO (biallelic) pathogenic mutations. No additional mutations or variants of uncertain significance were identified. Testing was performed through [employer]'s 46-gene Common Hereditary Cancers Panel. [employer]'s Common Hereditary Cancers Panel includ  . GERD (gastroesophageal reflux disease)     Past Surgical History:  Procedure Laterality Date  . ABDOMINAL HYSTERECTOMY Left 05/31/2016   Procedure: HYSTERECTOMY ABDOMINAL;  Surgeon: Everitt Amber, MD;  [facility];  Service: Gynecology;  Laterality: Left;  . BREAST SURGERY  1993   left breast-calcification  . COLONOSCOPY    . LAPAROTOMY N/A 05/31/2016   Procedure: EXPLORATORY LAPAROTOMY;  Surgeon: Everitt Amber, MD;  [facility];  Service: Gynecology;  Laterality: N/A;  . OMENTECTOMY  05/31/2016   Procedure: OMENTECTOMY AND STAGING FOR OVARIAN CANCER;  Surgeon: Everitt Amber, MD;  [facility];   Service: Gynecology;;  . SALPINGOOPHORECTOMY Right 05/31/2016   Procedure: BILATERAL SALPINGO OOPHORECTOMY;  Surgeon: Everitt Amber, MD;  [facility];  Service: Gynecology;  Laterality: Right;  . TONSILLECTOMY      There were no vitals filed for this visit.          [facility] PT Assessment - 03/07/17 1139      Assessment   Medical Diagnosis right medial shoulder pain   Referring Provider Delia Chimes MD     Observation/Other Assessments   Focus on Therapeutic Outcomes (FOTO)  FOTO intake 100%  limtation 0% predicted23%     AROM   Overall AROM  Deficits   Right Shoulder Flexion 164 Degrees   Right Shoulder ABduction 160 Degrees   Right Shoulder Internal Rotation 50 Degrees   Right Shoulder External Rotation 90 Degrees   Left Shoulder Flexion 150 Degrees   Left Shoulder ABduction 148 Degrees   Left Shoulder Internal Rotation 55 Degrees   Left Shoulder External Rotation 95 Degrees   Thoracic Flexion 55   Thoracic Extension 10   Thoracic - Right Side Bend 40   Thoracic - Left Side Bend 38   Thoracic - Right Rotation 100%   Thoracic - Left Rotation 90%     Strength   Overall Strength Deficits   Overall Strength Comments grossly 5/5 scapular stabilizers 4+/5 to 5/5                     [facility] Adult PT Treatment/Exercise - 03/07/17 1115      Shoulder Exercises: Supine  Other Supine Exercises supine green t band eccentric IR  right x 15     Shoulder Exercises: Seated   Other Seated Exercises Nustep level 4 8 minutes with UE and LE.     Shoulder Exercises: Standing   Other Standing Exercises standing at wall with UE flexion right and left x 10 with right foot forward and again with Left foot forward x 10   Other Standing Exercises standing with Free motion machine palloff press right and left x 15 each.  chopping and lifting x 15 each  13lb for all     Moist Heat Therapy   Moist Heat Location Shoulder  medial scapula and thoracic     Manual Therapy    Manual Therapy Soft tissue mobilization   Joint Mobilization thoracic PA mobs grade 3 T- 4 to T- 9 and rib junction T-5, 6 , 7    Soft tissue mobilization soft tissue of right medial scapula and serratus anterior. thoracic paraspinals bil          Trigger Point Dry Needling - 03/07/17 1117    Consent Given? Yes   Muscles Treated Upper Body Longissimus;Subscapularis  ride side ribs 6 and 7   Subscapularis Response Twitch response elicited;Palpable increased muscle length   Longissimus Response Twitch response elicited;Palpable increased muscle length              PT Education - 03/07/17 1118    Education provided Yes   Education Details reviewed HEP and gym equipment quesiton   Person(s) Educated Patient   Methods Explanation;Demonstration;Tactile cues;Verbal cues;Handout   Comprehension Verbalized understanding;Returned demonstration          PT Short Term Goals - 02/07/17 1839      PT SHORT TERM GOAL #1   Title STG=LTG           PT Long Term Goals - 03/07/17 1108      PT LONG TERM GOAL #1   Title Pt will be independent with advanced HEP   Baseline Pt independent with intial HEP  Progressing strengthe   Time 6   Period Weeks   Status Achieved     PT LONG TERM GOAL #2   Title "FOTO will improve from 19% limitation   to 4 % limitation    indicating improved functional mobility   Baseline improved to 0% limitation   Time 6   Period Weeks   Status Achieved     PT LONG TERM GOAL #3   Title Pt will be able to deeply breath and participate in yoga without exacerbating medial right scapula pain.   Baseline 90% better and has only rarely present, able to participate in all activity and gym    Time 6   Period Weeks   Status Achieved     PT LONG TERM GOAL #4   Title Pt will be able to roll over and sleep on back without awakening due to pain at night for more restorative sleep   Baseline Pt now able to roll over and  and sleep without pain and not awakening    Time 6   Period Weeks   Status Achieved     PT LONG TERM GOAL #5   Title Pt will be educated on proper posture and use of free weights for exercises and for injury prevention   Baseline utilizing gym equipment correctly now   Time 6   Period Weeks   Status Achieved  Patient will benefit from skilled therapeutic intervention in order to improve the following deficits and impairments:     Visit Diagnosis: Chronic right shoulder pain  Chronic right-sided thoracic back pain     Problem List Patient Active Problem List   Diagnosis Date Noted  . 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 Rectocele 11/19/2016  . Nodule of apex of right lung 11/19/2016  . Genetic testing 09/28/2016  . Ovarian cancer on right ([place]) 05/31/2016  . Ovarian cancer ([place]) 05/31/2016  . Pelvic mass 05/14/2016  . Hyperlipidemia 11/07/2012  . Fatigue 11/07/2012  . Family history of early CAD 11/07/2012   Voncille Lo, PT Certified Exercise Expert for the Aging Adult  03/07/17 1:00 PM Phone: [phone] Fax: [facility] [address] [place], [place], [address] Phone: [phone]   Fax:  [phone]  Name: Theresa Frazier MRN: [id number] Date of Birth: Dec 07, 1955   PHYSICAL THERAPY DISCHARGE SUMMARY  Visits from Start of Care: 5  Current functional level related to goals / functional outcomes: As above   Remaining deficits: None  Pt to move on to community fitness   Education / Equipment: HEP Plan: Patient agrees to discharge.  Patient goals were met. Patient is being discharged due to meeting the stated rehab goals.  ?????    and being pleased with current functional level  Voncille Lo, PT Certified Exercise Expert for the Aging Adult  03/07/17 1:02 PM Phone: [phone] Fax: [phone]

## 2017-04-12 ENCOUNTER — Telehealth: Payer: Self-pay | Admitting: *Deleted

## 2017-04-12 NOTE — Telephone Encounter (Signed)
Made referral to [facility] for incisional hernia per patient request. Appt made for November 21st at [facility].  Patient called and [facility] with the information.

## 2017-04-24 DIAGNOSIS — K432 Incisional hernia without obstruction or gangrene: Secondary | ICD-10-CM | POA: Diagnosis not present
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
## 2017-04-29 ENCOUNTER — Other Ambulatory Visit: Payer: Self-pay

## 2017-04-29 ENCOUNTER — Encounter: Payer: Self-pay | Admitting: Family Medicine

## 2017-04-29 ENCOUNTER — Ambulatory Visit (INDEPENDENT_AMBULATORY_CARE_PROVIDER_SITE_OTHER): Payer: BLUE CROSS/BLUE SHIELD | Admitting: Family Medicine

## 2017-04-29 VITALS — BP 124/78 | HR 82 | Temp 99.1°F | Resp 18 | Ht 62.0 in | Wt 160.0 lb

## 2017-04-29 DIAGNOSIS — Z23 Encounter for immunization: Secondary | ICD-10-CM | POA: Diagnosis not present

## 2017-04-29 DIAGNOSIS — E663 Overweight: Secondary | ICD-10-CM

## 2017-04-29 DIAGNOSIS — K439 Ventral hernia without obstruction or gangrene: Secondary | ICD-10-CM | POA: Diagnosis not present

## 2017-04-29 DIAGNOSIS — M25511 Pain in right shoulder: Secondary | ICD-10-CM

## 2017-04-29 NOTE — Patient Instructions (Addendum)
IF you received an x-ray today, you will receive an invoice from [facility]. Please contact [facility] at [phone] with questions or concerns regarding your invoice.   IF you received labwork today, you will receive an invoice from [facility]. Please contact [facility] at [phone] with questions or concerns regarding your invoice.   Our billing staff will not be able to assist you with questions regarding bills from these companies.  You will be contacted with the lab results as soon as they are available. The fastest way to get your results is to activate your My Chart account. Instructions are located on the last page of this paperwork. If you have not heard from [place] regarding the results in 2 weeks, please contact this office.     Adhesive Capsulitis Adhesive capsulitis is inflammation of the tendons and ligaments that surround the shoulder joint (shoulder capsule). This condition causes the shoulder to become stiff and painful to move. Adhesive capsulitis is also called frozen shoulder. What are the causes? This condition may be caused by:  An injury to the shoulder joint.  Straining the shoulder.  Not moving the shoulder for a period of time. This can happen if your arm was injured or in a sling.  Long-standing health problems, such as: ? Diabetes. ? Thyroid problems. ? Heart disease. ? Stroke. ? Rheumatoid arthritis. ? Lung disease.  In some cases, the cause may not be known. What increases the risk? This condition is more likely to develop in:  Women.  People who are older than 62 years of age.  What are the signs or symptoms? Symptoms of this condition include:  Pain in the shoulder when moving the arm. There may also be pain when parts of the shoulder are touched. The pain is worse at night or when at rest.  Soreness or aching in the shoulder.  Inability to move the shoulder normally.  Muscle spasms.  How is this diagnosed? This  condition is diagnosed with a physical exam and imaging tests, such as an X-ray or MRI. How is this treated? This condition may be treated with:  Treatment of the underlying cause or condition.  Physical therapy. This involves performing exercises to get the shoulder moving again.  Medicine. Medicine may be given to relieve pain, inflammation, or muscle spasms.  Steroid injections into the shoulder joint.  Shoulder manipulation. This is a procedure to move the shoulder into another position. It is done after you are given a medicine to make you fall asleep (general anesthetic). The joint may also be injected with salt water at high pressure to break down scarring.  Surgery. This may be done in severe cases when other treatments have failed.  Although most people recover completely from adhesive capsulitis, some may not regain the full movement of the shoulder. Follow these instructions at home:  Take over-the-counter and prescription medicines only as told by your health care provider.  If you are being treated with physical therapy, follow instructions from your physical therapist.  Avoid exercises that put a lot of demand on your shoulder, such as throwing. These exercises can make pain worse.  If directed, apply ice to the injured area: ? Put ice in a plastic bag. ? Place a towel between your skin and the bag. ? Leave the ice on for 20 minutes, 2-3 times per day. Contact a health care provider if:  You develop new symptoms.  Your symptoms get worse. This information is not intended to replace  advice given to you by your health care provider. Make sure you discuss any questions you have with your health care provider. Document Released: 03/18/2009 Document Revised: 10/27/2015 Document Reviewed: 09/13/2014 Elsevier Interactive Patient Education  [employer].

## 2017-05-03 ENCOUNTER — Telehealth: Payer: Self-pay | Admitting: *Deleted

## 2017-05-03 ENCOUNTER — Telehealth: Payer: Self-pay

## 2017-05-03 NOTE — Telephone Encounter (Signed)
Set up referral for the patient to see Dr.Catherine Zigmund Daniel at [facility] on January 29th at 8am. Notified the patient of appt and faxed records

## 2017-05-03 NOTE — Telephone Encounter (Signed)
Copied from [place]. Topic: General - Other >> May 02, 2017  2:26 PM Vernona Rieger wrote: Reason for CRM:  Pt just called & stated she was in the office the other day and saw Dr. Nolon Rod, she ws emontional at the appt due to a death in the family. She mentioned that Dr Nolon Rod told her that sometimes when you grieve you get joint pain. She said that her sister is grieving & is having a lot of joint pain. She wants to know what that is called & how she can explain that to her sister for her sister to give the info to her dr so she can get some help with this. Please call back @ [phone]

## 2017-05-24 ENCOUNTER — Other Ambulatory Visit (HOSPITAL_BASED_OUTPATIENT_CLINIC_OR_DEPARTMENT_OTHER): Payer: BLUE CROSS/BLUE SHIELD

## 2017-05-24 DIAGNOSIS — C569 Malignant neoplasm of unspecified ovary: Secondary | ICD-10-CM

## 2017-05-24 LAB — CEA (IN HOUSE-CHCC)

## 2017-05-25 LAB — CA 125: CANCER ANTIGEN (CA) 125: 11.6 U/mL (ref 0.0–38.1)

## 2017-05-31 ENCOUNTER — Encounter: Payer: Self-pay | Admitting: Gynecologic Oncology

## 2017-05-31 ENCOUNTER — Ambulatory Visit: Payer: BLUE CROSS/BLUE SHIELD | Attending: Gynecologic Oncology | Admitting: Gynecologic Oncology

## 2017-05-31 VITALS — BP 136/87 | HR 87 | Temp 98.1°F | Resp 16 | Ht 62.0 in | Wt 162.1 lb

## 2017-05-31 DIAGNOSIS — Z90710 Acquired absence of both cervix and uterus: Secondary | ICD-10-CM | POA: Diagnosis not present

## 2017-05-31 DIAGNOSIS — C569 Malignant neoplasm of unspecified ovary: Secondary | ICD-10-CM

## 2017-05-31 DIAGNOSIS — R911 Solitary pulmonary nodule: Secondary | ICD-10-CM | POA: Diagnosis not present

## 2017-05-31 DIAGNOSIS — Z90722 Acquired absence of ovaries, bilateral: Secondary | ICD-10-CM | POA: Diagnosis not present

## 2017-05-31 DIAGNOSIS — N816 Rectocele: Secondary | ICD-10-CM

## 2017-05-31 DIAGNOSIS — Z8543 Personal history of malignant neoplasm of ovary: Secondary | ICD-10-CM

## 2017-05-31 NOTE — Patient Instructions (Signed)
Follow up in March 2019 or sooner if needed.  Please call for any questions or concerns.  We will also plan to have your lab work drawn before you come for your appointment so the results will be available at your visit.

## 2017-05-31 NOTE — Progress Notes (Signed)
Follow-up Note: Gyn-Onc  Consult was requested by Dr. Marisue Humble and Dr Laurann Montana for the evaluation of Theresa Frazier 60 y.o. female  CC:  Chief Complaint  Patient presents with  . history of ovarian cancer    follow-up    Assessment/Plan:  [name]  is a 61 y.o.  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   I discussed the risk for recurrence associated with this disease. I discussed symptoms consistent with recurrence and for her to notify me of these should they occur.  I recommend 3 monthly evaluations with the next in September, 2018 with CEA and CA 125.  Rectocele and vaginal dryness - continue vaginal premarin for dryness symptoms.   65mm right lung apical nodule - recheck June, 2019.   HPI: Theresa Frazier is a 61 year old parous (P2) woman who is seen in consultation at the request of Dr Marisue Humble and Dr Laurann Montana for a complex cystic and solid pelvic mass.  The patient reported to her PCP's that she had abdominal bloating and fullness for 6-8 weeks since October 2017 when she saw them on 04/25/16. They performed a pelvic examination on that visit and appreciated a pelvic mass. A TVUS was ordered and performed on 05/04/16 and revealed a uterus measuring 8.4x3.7x4.8cm with a 7cm endometrial stripe (she has had no bleeding symptoms) and the ovaries were not discretely seen. Instead, there is a large complex cystic and solid mass at midline, extending into the right and left adnexum with internal blood flow. The mass measures 11 x 7.4 x 14.1 cm. Separate ovaries are not identified.  A CT scan had been ordered but canceled upon the US findings and plan for consultation with me.  She is feeling fairly well. Her pants fit differently lately and she feels she may have lost weight. Her bladder and bowels are moving normally.  She denies pain.  She has no family history for breast/ovarian cancer or any other significant cancer  history.  Her surgical history is significant for tonsillectomy only (no abdominal surgery).  CT scan on 05/16/16 showed: large (15cm) complex mass in the central anatomic pelvis appears to arise from the right ovary and is highly worrisome for a cystic ovarian carcinoma and a small amount of pelvic free fluid. CA 125 on 05/14/16 was elevated at 72.  On 05/31/16 she went to the OR for an exploratory laparotomy, TAH, BSO, omentectomy and peritoneal biopsies (no lymphadenectomy due to frozen section showing mucinous cell type).Intraoperative findings were significant for a 15cm right ovarian mass non-adherent, and otherwise normal peritoneal cavity and appendix. Frozen showed mucinous adencarcinoma.  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  Interval Hx: She has been feeling very well. Her nephew died recently of a heroin overdose.  CA 125 on 11/15/16 was normal at 11.9 CEA on 11/15/16 was normal at <1  CA 125 on 02/18/17 was normal at 14 CEA on 02/18/17 was normal at 1  CA 125 on 05/24/17 was normal at 11.6 CEA on 05/24/17 was normal at <1  Current Meds:  Outpatient Encounter Medications as of 05/31/2017  Medication Sig  . APPLE CIDER VINEGAR PO Take 2 mLs by mouth.  . conjugated estrogens (PREMARIN) vaginal cream Place 1 Applicatorful vaginally 3 (three) times a week.  . hydroxypropyl methylcellulose / hypromellose (ISOPTO TEARS / GONIOVISC) 2.5 % ophthalmic solution Place 1-2 drops into  both eyes 3 (three) times daily as needed for dry eyes.  . Multiple Vitamin (MULTIVITAMIN WITH MINERALS) TABS tablet Take 1 tablet by mouth daily.  . NON FORMULARY Patient uses essential oils as needed   No facility-administered encounter medications on file as of 05/31/2017.     Allergy: No Known Allergies  Social Hx:   Social History   Socioeconomic History  . Marital status: Married    Spouse  name: Not on file  . Number of children: Not on file  . Years of education: Not on file  . Highest education level: Not on file  Social Needs  . Financial resource strain: Not on file  . Food insecurity - worry: Not on file  . Food insecurity - inability: Not on file  . Transportation needs - medical: Not on file  . Transportation needs - non-medical: Not on file  Occupational History  . Not on file  Tobacco Use  . Smoking status: Former Smoker    Years: 7.00    Types: Cigarettes    Last attempt to quit: 11/07/1985    Years since quitting: 31.5  . Smokeless tobacco: Never Used  Substance and Sexual Activity  . Alcohol use: No  . Drug use: No  . Sexual activity: Not on file  Other Topics Concern  . Not on file  Social History Narrative  . Not on file    Past Surgical Hx:  Past Surgical History:  Procedure Laterality Date  . ABDOMINAL HYSTERECTOMY Left 05/31/2016   Procedure: HYSTERECTOMY ABDOMINAL;  Surgeon: Everitt Amber, MD;  Location: [facility];  Service: Gynecology;  Laterality: Left;  . BREAST SURGERY  1993   left breast-calcification  . COLONOSCOPY    . LAPAROTOMY N/A 05/31/2016   Procedure: EXPLORATORY LAPAROTOMY;  Surgeon: Everitt Amber, MD;  Location: [facility];  Service: Gynecology;  Laterality: N/A;  . OMENTECTOMY  05/31/2016   Procedure: OMENTECTOMY AND STAGING FOR OVARIAN CANCER;  Surgeon: Everitt Amber, MD;  Location: [facility];  Service: Gynecology;;  . SALPINGOOPHORECTOMY Right 05/31/2016   Procedure: BILATERAL SALPINGO OOPHORECTOMY;  Surgeon: Everitt Amber, MD;  Location: [facility];  Service: Gynecology;  Laterality: Right;  . TONSILLECTOMY      Past Medical Hx:  Past Medical History:  Diagnosis Date  . 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 Dyslipidemia   . Family history of early CAD   . Genetic testing 09/28/2016   Genetic testing revealed ONE pathogenic mutation in [place]. Though a pathogenic mutation was identified, increased risk for cancer is  only associated with TWO (biallelic) pathogenic mutations. No additional mutations or variants of uncertain significance were identified. Testing was performed through [facility]'s 46-gene Common Hereditary Cancers Panel. [facility]'s Common Hereditary Cancers Panel includ  . GERD (gastroesophageal reflux disease)     Past Gynecological History:  SVD x 2 No LMP recorded. Patient is postmenopausal.  Family Hx:  Family History  Problem Relation Age of Onset  . Pancreatic cancer Mother 43       d.73 - two months after diagnosis  . Addison's disease Maternal Aunt 76  . Leukemia Maternal Uncle 104       d.59 - shortly after diagnosis  . Melanoma Daughter 94    Review of Systems:  Constitutional  Feels well,    ENT Normal appearing ears and nares bilaterally Skin/Breast  No rash, sores, jaundice, itching, dryness Cardiovascular  No chest pain, shortness of breath, or edema  Pulmonary  No cough  or wheeze.  Gastro Intestinal  No nausea, vomitting, or diarrhoea. No bright red blood per rectum, no abdominal pain, change in bowel movement, or constipation.  Genito Urinary  No frequency, urgency, dysuria, no bleeding Musculo Skeletal  No myalgia, arthralgia, joint swelling or pain  Neurologic  No weakness, numbness, change in gait,  Psychology  No depression, anxiety, insomnia.   Vitals:  Blood pressure 136/87, pulse 87, temperature 98.1 F (36.7 C), temperature source Oral, resp. rate 16, height 5\' 2"  (1.575 m), weight 162 lb 1.6 oz (73.5 kg), SpO2 100 %.  Physical Exam: WD in NAD Neck  Supple NROM, without any enlargements.  Lymph Node Survey No cervical supraclavicular or inguinal adenopathy Cardiovascular  Pulse normal rate, regularity and rhythm. S1 and S2 normal.  Lungs  Clear to auscultation bilateraly, without wheezes/crackles/rhonchi. Good air movement.  Skin  No rash/lesions/breakdown  Psychiatry  Alert and oriented to person, place, and time  Abdomen  Normoactive  bowel sounds, abdomen soft, non-tender and nonobese. Right paramedian incisional hernia - no incarceration or tenderness. Incision is well healed without signs of infection or induration.. Back No CVA tenderness Genito Urinary: vaginal cuff in tact, no lesions. No masses. She has posterior vaginal wall prolapse (rectocele) to the level of the introitus with bearing down. Rectal  deferred  Extremities  No bilateral cyanosis, clubbing or edema.   Donaciano Eva, MD  05/31/2017, 2:39 PM

## 2017-07-17 DIAGNOSIS — K432 Incisional hernia without obstruction or gangrene: Secondary | ICD-10-CM | POA: Diagnosis not present

## 2017-07-25 ENCOUNTER — Telehealth: Payer: Self-pay

## 2017-07-25 NOTE — Telephone Encounter (Signed)
Theresa Frazier was hoping to see Theresa Frazier sooner then 08-23-17. The past month she has noticed abdominal tenderness when she press on her abdomen or at times sitting. She has experienced some sharp pains at times in abdomen. She feels bloated at times but contributes  it to weight gain. ~6 lb. increase since visit with Theresa Frazier on 05-31-18 and lack of exercise. Appetite has decreased over the last ~2 weeks. Bowels moving well. Pt has eaten nuts over the last ~ 2 weeks.  Mother had diverticulosis. Pt stated that she has an incisional  hernia where the ovary was removed.  Told Theresa Wendel that this information will be given to [name] to review tomorrow 07-26-17 and will call her with recommendations

## 2017-07-26 ENCOUNTER — Telehealth: Payer: Self-pay | Admitting: *Deleted

## 2017-07-26 ENCOUNTER — Telehealth: Payer: Self-pay | Admitting: Gynecologic Oncology

## 2017-07-26 NOTE — Telephone Encounter (Signed)
Returned the patient's call, patient stated that "after talking with Barbaraann Share yesterday and thinking she feels better. I believe the issue comes from where I did some new exercises. I did a long hold plank for over 30 seconds and started some new deep breathing exercises." Patient will still come in early to have lab work done, appt moved to Monday afternoon. [place] APP notified

## 2017-07-26 NOTE — Telephone Encounter (Signed)
Advised patient to please call the office to arrange for lab appt.  Advised of plan to obtain tumor marker labs, follow up on results, discuss need to scans, and then move Dr. Serita Grit appt up.

## 2017-07-29 ENCOUNTER — Inpatient Hospital Stay: Payer: BLUE CROSS/BLUE SHIELD | Attending: Gynecologic Oncology

## 2017-07-29 ENCOUNTER — Other Ambulatory Visit: Payer: Self-pay | Admitting: Gynecologic Oncology

## 2017-07-29 DIAGNOSIS — C561 Malignant neoplasm of right ovary: Secondary | ICD-10-CM | POA: Diagnosis not present

## 2017-07-29 LAB — CEA (IN HOUSE-CHCC): CEA (CHCC-In House): <1 ng/mL (ref 0.00–5.00)

## 2017-07-30 LAB — CA 125: CANCER ANTIGEN (CA) 125: 13.3 U/mL (ref 0.0–38.1)

## 2017-08-12 ENCOUNTER — Inpatient Hospital Stay: Payer: BLUE CROSS/BLUE SHIELD

## 2017-08-12 ENCOUNTER — Encounter: Payer: Self-pay | Admitting: Gynecologic Oncology

## 2017-08-12 ENCOUNTER — Inpatient Hospital Stay: Payer: BLUE CROSS/BLUE SHIELD | Attending: Gynecologic Oncology | Admitting: Gynecologic Oncology

## 2017-08-12 VITALS — BP 136/76 | HR 85 | Temp 98.0°F | Resp 18 | Ht 62.0 in | Wt 162.4 lb

## 2017-08-12 DIAGNOSIS — R1900 Intra-abdominal and pelvic swelling, mass and lump, unspecified site: Secondary | ICD-10-CM | POA: Diagnosis not present

## 2017-08-12 DIAGNOSIS — R1030 Lower abdominal pain, unspecified: Secondary | ICD-10-CM

## 2017-08-12 DIAGNOSIS — C561 Malignant neoplasm of right ovary: Secondary | ICD-10-CM | POA: Insufficient documentation

## 2017-08-12 DIAGNOSIS — Z90722 Acquired absence of ovaries, bilateral: Secondary | ICD-10-CM

## 2017-08-12 DIAGNOSIS — C569 Malignant neoplasm of unspecified ovary: Secondary | ICD-10-CM | POA: Diagnosis not present

## 2017-08-12 DIAGNOSIS — Z87891 Personal history of nicotine dependence: Secondary | ICD-10-CM

## 2017-08-12 DIAGNOSIS — Z90710 Acquired absence of both cervix and uterus: Secondary | ICD-10-CM | POA: Insufficient documentation

## 2017-08-12 DIAGNOSIS — R911 Solitary pulmonary nodule: Secondary | ICD-10-CM | POA: Diagnosis not present

## 2017-08-12 LAB — BASIC METABOLIC PANEL - CANCER CENTER ONLY
ANION GAP: 7 (ref 3–11)
BUN: 12 mg/dL (ref 7–26)
CHLORIDE: 107 mmol/L (ref 98–109)
CO2: 27 mmol/L (ref 22–29)
CREATININE: 0.82 mg/dL (ref 0.60–1.10)
Calcium: 9.4 mg/dL (ref 8.4–10.4)
GFR, Estimated: >60 mL/min (ref >60)
Glucose, Bld: 99 mg/dL (ref 70–140)
POTASSIUM: 4.1 mmol/L (ref 3.5–5.1)
SODIUM: 141 mmol/L (ref 136–145)

## 2017-08-12 NOTE — Patient Instructions (Signed)
Dr Denman George is scheduling a CT of the abdomen and pelvis to evaluate the abdominal symptoms. She is ordering a CT of the chest to re-evaluate the lung nodule.  If these are normal, please return as scheduled in June for repeat labs and a visit with Dr Denman George.  Please notify Dr Denman George at phone number [phone] if you notice vaginal bleeding, new pelvic or abdominal pains, bloating, feeling full easy, or a change in bladder or bowel function.

## 2017-08-12 NOTE — Progress Notes (Signed)
Follow-up Note: Gyn-Onc  Consult was requested by Dr. Marisue Humble and Dr Laurann Montana for the evaluation of Theresa Frazier 62 y.o. female  CC:  Chief Complaint  Patient presents with  . Malignant neoplasm of ovary, unspecified laterality [facility])    Assessment/Plan:  Theresa Frazier  is a 62 y.o.  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   I discussed the risk for recurrence associated with this disease. I discussed symptoms consistent with recurrence and for her to notify me of these should they occur.  Given her new abdominal symptoms, I am recommending CT abdo/pelvis to rule out recurrence.   I recommend 3 monthly evaluations with the next in June, 2019 with CEA and CA 125.  Rectocele and vaginal dryness - seeing Dr Zigmund Daniel for this  Ventral hernia - seeing Dr Grandville Silos for this.   54mm right lung apical nodule - recheck CT chest   HPI: Theresa Frazier is a 62 year old parous (P2) woman who is seen in consultation at the request of Dr Marisue Humble and Dr Laurann Montana for a complex cystic and solid pelvic mass.  The patient reported to her PCP's that she had abdominal bloating and fullness for 6-8 weeks since October 2017 when she saw them on 04/25/16. They performed a pelvic examination on that visit and appreciated a pelvic mass. A TVUS was ordered and performed on 05/04/16 and revealed a uterus measuring 8.4x3.7x4.8cm with a 7cm endometrial stripe (she has had no bleeding symptoms) and the ovaries were not discretely seen. Instead, there is a large complex cystic and solid mass at midline, extending into the right and left adnexum with internal blood flow. The mass measures 11 x 7.4 x 14.1 cm. Separate ovaries are not identified.  A CT scan had been ordered but canceled upon the US findings and plan for consultation with me.  She is feeling fairly well. Her pants fit differently lately and she feels she may have lost weight. Her bladder and bowels  are moving normally.  She denies pain.  She has no family history for breast/ovarian cancer or any other significant cancer history.  Her surgical history is significant for tonsillectomy only (no abdominal surgery).  CT scan on 05/16/16 showed: large (15cm) complex mass in the central anatomic pelvis appears to arise from the right ovary and is highly worrisome for a cystic ovarian carcinoma and a small amount of pelvic free fluid. CA 125 on 05/14/16 was elevated at 72.  On 05/31/16 she went to the OR for an exploratory laparotomy, TAH, BSO, omentectomy and peritoneal biopsies (no lymphadenectomy due to frozen section showing mucinous cell type).Intraoperative findings were significant for a 15cm right ovarian mass non-adherent, and otherwise normal peritoneal cavity and appendix. Frozen showed mucinous adencarcinoma.  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  Interval Hx: She has been feeling strange abdominal symptoms in the lower abdomen - feels like prediagnosis. Not sharp pains no emesis or nausea.   CA 125 on 11/15/16 was normal at 11.9 CEA on 11/15/16 was normal at <1  CA 125 on 02/18/17 was normal at 14 CEA on 02/18/17 was normal at 1  CA 125 on 05/24/17 was normal at 11.6 CEA on 05/24/17 was normal at <1  CA 125 on 07/29/17 was normal at 13.3 CEA on 07/29/17 was normal at <1  Current Meds:  Outpatient Encounter Medications as of 08/12/2017  Medication Sig  . APPLE CIDER VINEGAR PO Take 2 mLs by mouth.  . conjugated estrogens (PREMARIN) vaginal cream Place 1 Applicatorful vaginally 3 (three) times a week.  . hydroxypropyl methylcellulose / hypromellose (ISOPTO TEARS / GONIOVISC) 2.5 % ophthalmic solution Place 1-2 drops into both eyes 3 (three) times daily as needed for dry eyes.  . Multiple Vitamin (MULTIVITAMIN WITH MINERALS) TABS tablet Take 1 tablet by mouth daily.  . NON  FORMULARY Patient uses essential oils as needed   No facility-administered encounter medications on file as of 08/12/2017.     Allergy: No Known Allergies  Social Hx:   Social History   Socioeconomic History  . Marital status: Married    Spouse name: Not on file  . Number of children: Not on file  . Years of education: Not on file  . Highest education level: Not on file  Social Needs  . Financial resource strain: Not on file  . Food insecurity - worry: Not on file  . Food insecurity - inability: Not on file  . Transportation needs - medical: Not on file  . Transportation needs - non-medical: Not on file  Occupational History  . Not on file  Tobacco Use  . Smoking status: Former Smoker    Years: 7.00    Types: Cigarettes    Last attempt to quit: 11/07/1985    Years since quitting: 31.7  . Smokeless tobacco: Never Used  Substance and Sexual Activity  . Alcohol use: No  . Drug use: No  . Sexual activity: Not on file  Other Topics Concern  . Not on file  Social History Narrative  . Not on file    Past Surgical Hx:  Past Surgical History:  Procedure Laterality Date  . ABDOMINAL HYSTERECTOMY Left 05/31/2016   Procedure: HYSTERECTOMY ABDOMINAL;  Surgeon: Everitt Amber, MD;  Location: [facility];  Service: Gynecology;  Laterality: Left;  . BREAST SURGERY  1993   left breast-calcification  . COLONOSCOPY    . LAPAROTOMY N/A 05/31/2016   Procedure: EXPLORATORY LAPAROTOMY;  Surgeon: Everitt Amber, MD;  Location: [facility];  Service: Gynecology;  Laterality: N/A;  . OMENTECTOMY  05/31/2016   Procedure: OMENTECTOMY AND STAGING FOR OVARIAN CANCER;  Surgeon: Everitt Amber, MD;  Location: [facility];  Service: Gynecology;;  . SALPINGOOPHORECTOMY Right 05/31/2016   Procedure: BILATERAL SALPINGO OOPHORECTOMY;  Surgeon: Everitt Amber, MD;  Location: [facility];  Service: Gynecology;  Laterality: Right;  . TONSILLECTOMY      Past Medical Hx:  Past Medical History:  Diagnosis Date  . Chest pain     atypical, myoview 05/31/05-EF 75%, low risk  . Dyslipidemia   . Family history of early CAD   . Genetic testing 09/28/2016   Genetic testing revealed ONE pathogenic mutation in [place]. Though a pathogenic mutation was identified, increased risk for cancer is only associated with TWO (biallelic) pathogenic mutations. No additional mutations or variants of uncertain significance were identified. Testing was performed through [employer]'s 46-gene Common Hereditary Cancers Panel. [facility]'s Common Hereditary Cancers Panel includ  . GERD (gastroesophageal reflux disease)     Past Gynecological History:  SVD x 2 No LMP recorded. Patient is postmenopausal.  Family Hx:  Family History  Problem Relation Age of Onset  . Pancreatic cancer Mother 27       d.73 - two months after diagnosis  . Addison's disease Maternal Aunt 76  . Leukemia Maternal Uncle 36       d.59 - shortly after diagnosis  .  Melanoma Daughter 70    Review of Systems:  Constitutional  Feels well,    ENT Normal appearing ears and nares bilaterally Skin/Breast  No rash, sores, jaundice, itching, dryness Cardiovascular  No chest pain, shortness of breath, or edema  Pulmonary  No cough or wheeze.  Gastro Intestinal  No nausea, vomitting, or diarrhoea. No bright red blood per rectum, no abdominal pain, change in bowel movement, or constipation.  Genito Urinary  No frequency, urgency, dysuria, no bleeding Musculo Skeletal  No myalgia, arthralgia, joint swelling or pain  Neurologic  No weakness, numbness, change in gait,  Psychology  No depression, anxiety, insomnia.   Vitals:  Blood pressure 136/76, pulse 85, temperature 98 F (36.7 C), temperature source Oral, resp. rate 18, height 5\' 2"  (1.575 m), weight 162 lb 6.4 oz (73.7 kg), SpO2 100 %.  Physical Exam: WD in NAD Neck  Supple NROM, without any enlargements.  Lymph Node Survey No cervical supraclavicular or inguinal adenopathy Cardiovascular  Pulse normal rate,  regularity and rhythm. S1 and S2 normal.  Lungs  Clear to auscultation bilateraly, without wheezes/crackles/rhonchi. Good air movement.  Skin  No rash/lesions/breakdown  Psychiatry  Alert and oriented to person, place, and time  Abdomen  Normoactive bowel sounds, abdomen soft, non-tender and nonobese. Right paramedian incisional hernia - no incarceration or tenderness.  Back No CVA tenderness Genito Urinary: vaginal cuff in tact, no lesions. No masses. She has posterior vaginal wall prolapse (rectocele) to the level of the introitus with bearing down. Rectal  deferred  Extremities  No bilateral cyanosis, clubbing or edema.   Thereasa Solo, MD  08/12/2017, 1:40 PM

## 2017-08-16 ENCOUNTER — Ambulatory Visit (HOSPITAL_COMMUNITY)
Admission: RE | Admit: 2017-08-16 | Discharge: 2017-08-16 | Disposition: A | Discharge status: Home / Self Care | Payer: BLUE CROSS/BLUE SHIELD | Source: Ambulatory Visit | Attending: Gynecologic Oncology | Admitting: Gynecologic Oncology

## 2017-08-16 DIAGNOSIS — C569 Malignant neoplasm of unspecified ovary: Secondary | ICD-10-CM | POA: Insufficient documentation

## 2017-08-16 DIAGNOSIS — I700 Atherosclerosis of aorta: Secondary | ICD-10-CM | POA: Insufficient documentation

## 2017-08-16 DIAGNOSIS — R918 Other nonspecific abnormal finding of lung field: Secondary | ICD-10-CM | POA: Diagnosis not present

## 2017-08-16 DIAGNOSIS — Z9079 Acquired absence of other genital organ(s): Secondary | ICD-10-CM | POA: Insufficient documentation

## 2017-08-16 DIAGNOSIS — Z90722 Acquired absence of ovaries, bilateral: Secondary | ICD-10-CM | POA: Diagnosis not present

## 2017-08-16 DIAGNOSIS — Z90710 Acquired absence of both cervix and uterus: Secondary | ICD-10-CM | POA: Diagnosis not present

## 2017-08-16 DIAGNOSIS — R911 Solitary pulmonary nodule: Secondary | ICD-10-CM

## 2017-08-16 DIAGNOSIS — R1030 Lower abdominal pain, unspecified: Secondary | ICD-10-CM | POA: Diagnosis not present

## 2017-08-16 MED ORDER — IOPAMIDOL (ISOVUE-300) INJECTION 61%
100.0000 mL | Freq: Once | INTRAVENOUS | Status: AC | PRN
  Administered 2017-08-16: 100 mL via INTRAVENOUS

## 2017-08-16 MED ORDER — IOPAMIDOL (ISOVUE-300) INJECTION 61%
INTRAVENOUS | Status: AC
  Filled 2017-08-16: qty 100

## 2017-08-22 ENCOUNTER — Other Ambulatory Visit: Payer: BLUE CROSS/BLUE SHIELD

## 2017-08-23 ENCOUNTER — Ambulatory Visit: Payer: BLUE CROSS/BLUE SHIELD | Admitting: Gynecologic Oncology

## 2017-08-27 ENCOUNTER — Telehealth: Payer: Self-pay | Admitting: Gynecologic Oncology

## 2017-08-27 NOTE — Telephone Encounter (Signed)
Informed patient of CT scan results.  Routed results to her PCP, Dr. Georganna Skeans at [place] per her request.  Advised to call for any needs.  No concerns voiced.

## 2017-08-28 DIAGNOSIS — N816 Rectocele: Secondary | ICD-10-CM | POA: Diagnosis not present

## 2017-08-28 DIAGNOSIS — N3281 Overactive bladder: Secondary | ICD-10-CM | POA: Diagnosis not present

## 2017-08-28 DIAGNOSIS — N3941 Urge incontinence: Secondary | ICD-10-CM | POA: Diagnosis not present

## 2017-08-28 DIAGNOSIS — N993 Prolapse of vaginal vault after hysterectomy: Secondary | ICD-10-CM | POA: Diagnosis not present
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
## 2017-10-30 ENCOUNTER — Other Ambulatory Visit: Payer: Self-pay

## 2017-10-30 ENCOUNTER — Ambulatory Visit: Payer: BLUE CROSS/BLUE SHIELD | Admitting: Family Medicine

## 2017-10-30 ENCOUNTER — Encounter: Payer: Self-pay | Admitting: Family Medicine

## 2017-10-30 VITALS — BP 130/78 | HR 79 | Temp 98.7°F | Resp 16 | Ht 62.0 in | Wt 164.8 lb

## 2017-10-30 DIAGNOSIS — C561 Malignant neoplasm of right ovary: Secondary | ICD-10-CM | POA: Diagnosis not present

## 2017-10-30 DIAGNOSIS — Z131 Encounter for screening for diabetes mellitus: Secondary | ICD-10-CM

## 2017-10-30 DIAGNOSIS — N993 Prolapse of vaginal vault after hysterectomy: Secondary | ICD-10-CM

## 2017-10-30 DIAGNOSIS — E559 Vitamin D deficiency, unspecified: Secondary | ICD-10-CM | POA: Diagnosis not present

## 2017-10-30 DIAGNOSIS — N952 Postmenopausal atrophic vaginitis: Secondary | ICD-10-CM

## 2017-10-30 DIAGNOSIS — E782 Mixed hyperlipidemia: Secondary | ICD-10-CM

## 2017-10-30 DIAGNOSIS — N816 Rectocele: Secondary | ICD-10-CM

## 2017-10-30 NOTE — Patient Instructions (Addendum)
     IF you received an x-ray today, you will receive an invoice from [facility]. Please contact [facility] at [phone] with questions or concerns regarding your invoice.   IF you received labwork today, you will receive an invoice from [facility]. Please contact [facility] at [phone] with questions or concerns regarding your invoice.   Our billing staff will not be able to assist you with questions regarding bills from these companies.  You will be contacted with the lab results as soon as they are available. The fastest way to get your results is to activate your My Chart account. Instructions are located on the last page of this paperwork. If you have not heard from us regarding the results in 2 weeks, please contact this office.     

## 2017-10-31 LAB — VITAMIN D 25 HYDROXY (VIT D DEFICIENCY, FRACTURES): VIT D 25 HYDROXY: 37.9 ng/mL (ref 30.0–100.0)

## 2017-10-31 LAB — LIPID PANEL
CHOL/HDL RATIO: 3.3 ratio (ref 0.0–4.4)
CHOLESTEROL TOTAL: 237 mg/dL — AB (ref 100–199)
HDL: 72 mg/dL (ref >39)
LDL CALC: 153 mg/dL — AB (ref 0–99)
Triglycerides: 58 mg/dL (ref 0–149)
VLDL Cholesterol Cal: 12 mg/dL (ref 5–40)

## 2017-10-31 LAB — HEMOGLOBIN A1C
ESTIMATED AVERAGE GLUCOSE: 114 mg/dL
Hgb A1c MFr Bld: 5.6 % (ref 4.8–5.6)

## 2017-11-07 ENCOUNTER — Inpatient Hospital Stay: Payer: BLUE CROSS/BLUE SHIELD | Attending: Gynecologic Oncology

## 2017-11-07 DIAGNOSIS — R1030 Lower abdominal pain, unspecified: Secondary | ICD-10-CM

## 2017-11-07 DIAGNOSIS — Z87891 Personal history of nicotine dependence: Secondary | ICD-10-CM | POA: Insufficient documentation

## 2017-11-07 DIAGNOSIS — N816 Rectocele: Secondary | ICD-10-CM | POA: Insufficient documentation

## 2017-11-07 DIAGNOSIS — Z90722 Acquired absence of ovaries, bilateral: Secondary | ICD-10-CM | POA: Diagnosis not present

## 2017-11-07 DIAGNOSIS — Z90710 Acquired absence of both cervix and uterus: Secondary | ICD-10-CM | POA: Diagnosis not present

## 2017-11-07 DIAGNOSIS — C561 Malignant neoplasm of right ovary: Secondary | ICD-10-CM | POA: Insufficient documentation

## 2017-11-07 DIAGNOSIS — C569 Malignant neoplasm of unspecified ovary: Secondary | ICD-10-CM

## 2017-11-07 DIAGNOSIS — K439 Ventral hernia without obstruction or gangrene: Secondary | ICD-10-CM | POA: Diagnosis not present

## 2017-11-07 LAB — BASIC METABOLIC PANEL
Anion gap: 8 (ref 3–11)
BUN: 16 mg/dL (ref 7–26)
CHLORIDE: 105 mmol/L (ref 98–109)
CO2: 28 mmol/L (ref 22–29)
CREATININE: 0.78 mg/dL (ref 0.60–1.10)
Calcium: 9.8 mg/dL (ref 8.4–10.4)
GFR calc Af Amer: >60 mL/min (ref >60)
GFR calc non Af Amer: >60 mL/min (ref >60)
GLUCOSE: 98 mg/dL (ref 70–140)
POTASSIUM: 4.2 mmol/L (ref 3.5–5.1)
SODIUM: 141 mmol/L (ref 136–145)

## 2017-11-08 LAB — CEA: CEA: 0.6 ng/mL (ref 0.0–4.7)

## 2017-11-08 LAB — CA 125: Cancer Antigen (CA) 125: 12.4 U/mL (ref 0.0–38.1)

## 2017-11-11 ENCOUNTER — Encounter: Payer: Self-pay | Admitting: Gynecologic Oncology

## 2017-11-11 ENCOUNTER — Telehealth: Payer: Self-pay

## 2017-11-11 NOTE — Telephone Encounter (Signed)
Returned pt's call regarding her CA 125 results.  Pt verbalized she was able to receive the email with results from [facility] NP but not the actual result.  I let her know per Lenna Sciara NP she tried but "inbox was full" and pt was unsure what that might mean.  Gave her the phone number for my chart help desk and pt said she will call them.  No other needs per pt at this time.

## 2017-11-13 ENCOUNTER — Inpatient Hospital Stay (HOSPITAL_BASED_OUTPATIENT_CLINIC_OR_DEPARTMENT_OTHER): Payer: BLUE CROSS/BLUE SHIELD | Admitting: Gynecologic Oncology

## 2017-11-13 ENCOUNTER — Encounter: Payer: Self-pay | Admitting: Gynecologic Oncology

## 2017-11-13 VITALS — BP 133/78 | HR 91 | Temp 98.2°F | Resp 20 | Ht 62.0 in | Wt 167.6 lb

## 2017-11-13 DIAGNOSIS — C569 Malignant neoplasm of unspecified ovary: Secondary | ICD-10-CM

## 2017-11-13 DIAGNOSIS — K439 Ventral hernia without obstruction or gangrene: Secondary | ICD-10-CM | POA: Diagnosis not present

## 2017-11-13 DIAGNOSIS — N816 Rectocele: Secondary | ICD-10-CM

## 2017-11-13 DIAGNOSIS — Z90710 Acquired absence of both cervix and uterus: Secondary | ICD-10-CM | POA: Diagnosis not present

## 2017-11-13 DIAGNOSIS — Z90722 Acquired absence of ovaries, bilateral: Secondary | ICD-10-CM | POA: Diagnosis not present

## 2017-11-13 DIAGNOSIS — C561 Malignant neoplasm of right ovary: Secondary | ICD-10-CM | POA: Diagnosis not present

## 2017-11-13 DIAGNOSIS — Z87891 Personal history of nicotine dependence: Secondary | ICD-10-CM

## 2017-11-13 NOTE — Progress Notes (Signed)
Follow-up Note: Gyn-Onc  Consult was requested by Dr. Marisue Humble and Dr Laurann Montana for the evaluation of Theresa Frazier 62 y.o. female  CC:  Chief Complaint  Patient presents with  . Malignant neoplasm of ovary, unspecified laterality [facility])    Assessment/Plan:  Theresa Frazier  is a 62 y.o.  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 No measurable disease.   I discussed the risk for recurrence associated with this disease. I discussed symptoms consistent with recurrence and for her to notify me of these should they occur.  I recommend 3 monthly evaluations with the next in September, 2019 with CEA and CA 125.  Rectocele and vaginal dryness - seeing Dr Zigmund Daniel for this  Ventral hernia - seeing Dr Grandville Silos for this.    HPI: Theresa Frazier is a 62 year old parous (P2) woman who is seen in consultation at the request of Dr Marisue Humble and Dr Laurann Montana for a complex cystic and solid pelvic mass.  The patient reported to her PCP's that she had abdominal bloating and fullness for 6-8 weeks since October 2017 when she saw them on 04/25/16. They performed a pelvic examination on that visit and appreciated a pelvic mass. A TVUS was ordered and performed on 05/04/16 and revealed a uterus measuring 8.4x3.7x4.8cm with a 7cm endometrial stripe (she has had no bleeding symptoms) and the ovaries were not discretely seen. Instead, there is a large complex cystic and solid mass at midline, extending into the right and left adnexum with internal blood flow. The mass measures 11 x 7.4 x 14.1 cm. Separate ovaries are not identified.  A CT scan had been ordered but canceled upon the US findings and plan for consultation with me.  She is feeling fairly well. Her pants fit differently lately and she feels she may have lost weight. Her bladder and bowels are moving normally.  She denies pain.  She has no family history for breast/ovarian cancer or any other  significant cancer history.  Her surgical history is significant for tonsillectomy only (no abdominal surgery).  CT scan on 05/16/16 showed: large (15cm) complex mass in the central anatomic pelvis appears to arise from the right ovary and is highly worrisome for a cystic ovarian carcinoma and a small amount of pelvic free fluid. CA 125 on 05/14/16 was elevated at 72.  On 05/31/16 she went to the OR for an exploratory laparotomy, TAH, BSO, omentectomy and peritoneal biopsies (no lymphadenectomy due to frozen section showing mucinous cell type).Intraoperative findings were significant for a 15cm right ovarian mass non-adherent, and otherwise normal peritoneal cavity and appendix. Frozen showed mucinous adencarcinoma.  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  Interval Hx: She has been feeling strange abdominal symptoms in the lower abdomen - feels like prediagnosis. Not sharp pains no emesis or nausea.   CA 125 on 11/15/16 was normal at 11.9 CEA on 11/15/16 was normal at <1  CA 125 on 02/18/17 was normal at 14 CEA on 02/18/17 was normal at 1  CA 125 on 05/24/17 was normal at 11.6 CEA on 05/24/17 was normal at <1  CA 125 on 07/29/17 was normal at 13.3 CEA on 07/29/17 was normal at <1  CA 125 on 11/07/17 was normal at 12.4 CEA on 11/07/17 was normal at <1.   CT abd/pelvis and chest on 08/16/17 showed no evidence of recurrence and stable pulm and  peritoneal nodules.   Current Meds:  Outpatient Encounter Medications as of 11/13/2017  Medication Sig  . APPLE CIDER VINEGAR PO Take 2 mLs by mouth.  . Black Currant Seed Oil 500 MG CAPS Take by mouth.  . conjugated estrogens (PREMARIN) vaginal cream Place 1 Applicatorful vaginally 3 (three) times a week.  . hydroxypropyl methylcellulose / hypromellose (ISOPTO TEARS / GONIOVISC) 2.5 % ophthalmic solution Place 1-2 drops into both eyes 3 (three) times  daily as needed for dry eyes.  . Multiple Vitamin (MULTIVITAMIN WITH MINERALS) TABS tablet Take 1 tablet by mouth daily.  . NON FORMULARY Patient uses essential oils as needed   No facility-administered encounter medications on file as of 11/13/2017.     Allergy: No Known Allergies  Social Hx:   Social History   Socioeconomic History  . Marital status: Married    Spouse name: Not on file  . Number of children: Not on file  . Years of education: Not on file  . Highest education level: Not on file  Occupational History  . Not on file  Social Needs  . Financial resource strain: Not on file  . Food insecurity:    Worry: Not on file    Inability: Not on file  . Transportation needs:    Medical: Not on file    Non-medical: Not on file  Tobacco Use  . Smoking status: Former Smoker    Years: 7.00    Types: Cigarettes    Last attempt to quit: 11/07/1985    Years since quitting: 32.0  . Smokeless tobacco: Never Used  Substance and Sexual Activity  . Alcohol use: No  . Drug use: No  . Sexual activity: Not on file  Lifestyle  . Physical activity:    Days per week: Not on file    Minutes per session: Not on file  . Stress: Not on file  Relationships  . Social connections:    Talks on phone: Not on file    Gets together: Not on file    Attends religious service: Not on file    Active member of club or organization: Not on file    Attends meetings of clubs or organizations: Not on file    Relationship status: Not on file  . Intimate partner violence:    Fear of current or ex partner: Not on file    Emotionally abused: Not on file    Physically abused: Not on file    Forced sexual activity: Not on file  Other Topics Concern  . Not on file  Social History Narrative  . Not on file    Past Surgical Hx:  Past Surgical History:  Procedure Laterality Date  . ABDOMINAL HYSTERECTOMY Left 05/31/2016   Procedure: HYSTERECTOMY ABDOMINAL;  Surgeon: Everitt Amber, MD;  Location: [facility];  Service: Gynecology;  Laterality: Left;  . BREAST SURGERY  1993   left breast-calcification  . COLONOSCOPY    . LAPAROTOMY N/A 05/31/2016   Procedure: EXPLORATORY LAPAROTOMY;  Surgeon: Everitt Amber, MD;  Location: [facility];  Service: Gynecology;  Laterality: N/A;  . OMENTECTOMY  05/31/2016   Procedure: OMENTECTOMY AND STAGING FOR OVARIAN CANCER;  Surgeon: Everitt Amber, MD;  Location: [facility];  Service: Gynecology;;  . SALPINGOOPHORECTOMY Right 05/31/2016   Procedure: BILATERAL SALPINGO OOPHORECTOMY;  Surgeon: Everitt Amber, MD;  Location: [facility];  Service: Gynecology;  Laterality: Right;  . TONSILLECTOMY      Past Medical Hx:  Past Medical History:  Diagnosis Date  .  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 Dyslipidemia   . Family history of early CAD   . Genetic testing 09/28/2016   Genetic testing revealed ONE pathogenic mutation in [place]. Though a pathogenic mutation was identified, increased risk for cancer is only associated with TWO (biallelic) pathogenic mutations. No additional mutations or variants of uncertain significance were identified. Testing was performed through [employer]'s 46-gene Common Hereditary Cancers Panel. [facility]'s Common Hereditary Cancers Panel includ  . GERD (gastroesophageal reflux disease)     Past Gynecological History:  SVD x 2 No LMP recorded. Patient is postmenopausal.  Family Hx:  Family History  Problem Relation Age of Onset  . Pancreatic cancer Mother 33       d.73 - two months after diagnosis  . Addison's disease Maternal Aunt 76  . Leukemia Maternal Uncle 69       d.59 - shortly after diagnosis  . Melanoma Daughter 73    Review of Systems:  Constitutional  Feels well,    ENT Normal appearing ears and nares bilaterally Skin/Breast  No rash, sores, jaundice, itching, dryness Cardiovascular  No chest pain, shortness of breath, or edema  Pulmonary  No cough or wheeze.  Gastro Intestinal  No nausea, vomitting, or  diarrhoea. No bright red blood per rectum, no abdominal pain, change in bowel movement, or constipation.  Genito Urinary  No frequency, urgency, dysuria, no bleeding Musculo Skeletal  No myalgia, arthralgia, joint swelling or pain  Neurologic  No weakness, numbness, change in gait,  Psychology  No depression, anxiety, insomnia.   Vitals:  Blood pressure 133/78, pulse 91, temperature 98.2 F (36.8 C), temperature source Oral, resp. rate 20, height 5\' 2"  (1.575 m), weight 167 lb 9.6 oz (76 kg), SpO2 100 %.  Physical Exam: WD in NAD Neck  Supple NROM, without any enlargements.  Lymph Node Survey No cervical supraclavicular or inguinal adenopathy Cardiovascular  Pulse normal rate, regularity and rhythm. S1 and S2 normal.  Lungs  Clear to auscultation bilateraly, without wheezes/crackles/rhonchi. Good air movement.  Skin  No rash/lesions/breakdown  Psychiatry  Alert and oriented to person, place, and time  Abdomen  Normoactive bowel sounds, abdomen soft, non-tender and nonobese. Right paramedian incisional hernia - no incarceration or tenderness.  Back No CVA tenderness Genito Urinary: vaginal cuff in tact, no lesions. No masses. She has posterior vaginal wall prolapse (rectocele) to the level of the introitus with bearing down. Rectal  deferred  Extremities  No bilateral cyanosis, clubbing or edema.   Thereasa Solo, MD  11/13/2017, 3:33 PM

## 2017-11-13 NOTE — Patient Instructions (Signed)
Please notify Dr Denman George at phone number [phone] if you notice vaginal bleeding, new pelvic or abdominal pains, bloating, feeling full easy, or a change in bladder or bowel function.   Please return to see Dr Denman George in 3 months as planned with labs ordered prior to that visit.

## 2018-01-02 ENCOUNTER — Other Ambulatory Visit: Payer: Self-pay | Admitting: Family Medicine

## 2018-01-02 DIAGNOSIS — Z1231 Encounter for screening mammogram for malignant neoplasm of breast: Secondary | ICD-10-CM

## 2018-01-22 DIAGNOSIS — N816 Rectocele: Secondary | ICD-10-CM | POA: Diagnosis not present

## 2018-01-22 DIAGNOSIS — N952 Postmenopausal atrophic vaginitis: Secondary | ICD-10-CM | POA: Diagnosis not present

## 2018-01-22 DIAGNOSIS — N3281 Overactive bladder: Secondary | ICD-10-CM | POA: Diagnosis not present

## 2018-01-27 ENCOUNTER — Ambulatory Visit
Admission: RE | Admit: 2018-01-27 | Discharge: 2018-01-27 | Disposition: A | Discharge status: Home / Self Care | Payer: BLUE CROSS/BLUE SHIELD | Source: Ambulatory Visit | Attending: Family Medicine | Admitting: Family Medicine

## 2018-01-27 DIAGNOSIS — Z1231 Encounter for screening mammogram for malignant neoplasm of breast: Secondary | ICD-10-CM | POA: Diagnosis not present

## 2018-02-24 ENCOUNTER — Inpatient Hospital Stay: Payer: BLUE CROSS/BLUE SHIELD | Attending: Gynecologic Oncology

## 2018-02-24 DIAGNOSIS — N816 Rectocele: Secondary | ICD-10-CM | POA: Diagnosis not present

## 2018-02-24 DIAGNOSIS — C569 Malignant neoplasm of unspecified ovary: Secondary | ICD-10-CM | POA: Diagnosis not present

## 2018-02-24 DIAGNOSIS — Z90722 Acquired absence of ovaries, bilateral: Secondary | ICD-10-CM | POA: Diagnosis not present

## 2018-02-24 DIAGNOSIS — Z90710 Acquired absence of both cervix and uterus: Secondary | ICD-10-CM | POA: Diagnosis not present

## 2018-02-24 LAB — CEA (IN HOUSE-CHCC): CEA (CHCC-IN HOUSE): 1.04 ng/mL (ref 0.00–5.00)

## 2018-02-25 DIAGNOSIS — H524 Presbyopia: Secondary | ICD-10-CM | POA: Diagnosis not present

## 2018-02-25 DIAGNOSIS — H40013 Open angle with borderline findings, low risk, bilateral: Secondary | ICD-10-CM | POA: Diagnosis not present

## 2018-02-25 DIAGNOSIS — H2513 Age-related nuclear cataract, bilateral: Secondary | ICD-10-CM | POA: Diagnosis not present

## 2018-02-25 DIAGNOSIS — H04123 Dry eye syndrome of bilateral lacrimal glands: Secondary | ICD-10-CM | POA: Diagnosis not present

## 2018-02-25 LAB — CA 125: CANCER ANTIGEN (CA) 125: 10 U/mL (ref 0.0–38.1)

## 2018-02-28 ENCOUNTER — Encounter: Payer: Self-pay | Admitting: Gynecologic Oncology

## 2018-02-28 ENCOUNTER — Inpatient Hospital Stay (HOSPITAL_BASED_OUTPATIENT_CLINIC_OR_DEPARTMENT_OTHER): Payer: BLUE CROSS/BLUE SHIELD | Admitting: Gynecologic Oncology

## 2018-02-28 VITALS — BP 125/59 | HR 61 | Temp 98.4°F | Resp 20 | Ht 62.0 in | Wt 165.1 lb

## 2018-02-28 DIAGNOSIS — Z90710 Acquired absence of both cervix and uterus: Secondary | ICD-10-CM | POA: Diagnosis not present

## 2018-02-28 DIAGNOSIS — Z90722 Acquired absence of ovaries, bilateral: Secondary | ICD-10-CM

## 2018-02-28 DIAGNOSIS — C569 Malignant neoplasm of unspecified ovary: Secondary | ICD-10-CM

## 2018-02-28 DIAGNOSIS — N816 Rectocele: Secondary | ICD-10-CM | POA: Diagnosis not present

## 2018-02-28 NOTE — Progress Notes (Signed)
Follow-up Note: Gyn-Onc  Consult was requested by Dr. Marisue Humble and Dr Laurann Montana for the evaluation of Theresa Frazier 62 y.o. female  CC:  Chief Complaint  Patient presents with  . Malignant neoplasm of ovary, unspecified laterality [facility])    Assessment/Plan:  Theresa Frazier  is a 62 y.o.  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 No measurable disease.   I discussed the risk for recurrence associated with this disease. I discussed symptoms consistent with recurrence and for her to notify me of these should they occur.  I recommend 3 monthly evaluations with the next in September, 2019 with CEA and CA 125.  Rectocele and vaginal dryness - treated with premarin. S/p consult with Dr Zigmund Daniel.   Ventral hernia - seeing Dr Grandville Silos for this.    HPI: Theresa Frazier is a 62 year old parous (P2) woman who is seen in consultation at the request of Dr Marisue Humble and Dr Laurann Montana for a complex cystic and solid pelvic mass.  The patient reported to her PCP's that she had abdominal bloating and fullness for 6-8 weeks since October 2017 when she saw them on 04/25/16. They performed a pelvic examination on that visit and appreciated a pelvic mass. A TVUS was ordered and performed on 05/04/16 and revealed a uterus measuring 8.4x3.7x4.8cm with a 7cm endometrial stripe (she has had no bleeding symptoms) and the ovaries were not discretely seen. Instead, there is a large complex cystic and solid mass at midline, extending into the right and left adnexum with internal blood flow. The mass measures 11 x 7.4 x 14.1 cm. Separate ovaries are not identified.  A CT scan had been ordered but canceled upon the US findings and plan for consultation with me.  She is feeling fairly well. Her pants fit differently lately and she feels she may have lost weight. Her bladder and bowels are moving normally.  She denies pain.  She has no family history for breast/ovarian  cancer or any other significant cancer history.  Her surgical history is significant for tonsillectomy only (no abdominal surgery).  CT scan on 05/16/16 showed: large (15cm) complex mass in the central anatomic pelvis appears to arise from the right ovary and is highly worrisome for a cystic ovarian carcinoma and a small amount of pelvic free fluid. CA 125 on 05/14/16 was elevated at 72.  On 05/31/16 she went to the OR for an exploratory laparotomy, TAH, BSO, omentectomy and peritoneal biopsies (no lymphadenectomy due to frozen section showing mucinous cell type).Intraoperative findings were significant for a 15cm right ovarian mass non-adherent, and otherwise normal peritoneal cavity and appendix. Frozen showed mucinous adencarcinoma.  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  Interval Hx: She has been feeling strange abdominal symptoms in the lower abdomen - feels like prediagnosis. Not sharp pains no emesis or nausea.   CA 125 on 11/15/16 was normal at 11.9 CEA on 11/15/16 was normal at <1  CA 125 on 02/18/17 was normal at 14 CEA on 02/18/17 was normal at 1  CA 125 on 05/24/17 was normal at 11.6 CEA on 05/24/17 was normal at <1  CA 125 on 07/29/17 was normal at 13.3 CEA on 07/29/17 was normal at <1  CA 125 on 11/07/17 was normal at 12.4 CEA on 11/07/17 was normal at <1.   CT abd/pelvis and chest on 08/16/17 showed no evidence of recurrence  and stable pulm and peritoneal nodules.   CA 125 on 02/24/18 was normal at 10 CEA on 02/24/18 was normal at <1.   Current Meds:  Outpatient Encounter Medications as of 02/28/2018  Medication Sig  . APPLE CIDER VINEGAR PO Take 2 mLs by mouth.  . Black Currant Seed Oil 500 MG CAPS Take by mouth.  . conjugated estrogens (PREMARIN) vaginal cream Place 1 Applicatorful vaginally 3 (three) times a week.  . hydroxypropyl methylcellulose / hypromellose (ISOPTO  TEARS / GONIOVISC) 2.5 % ophthalmic solution Place 1-2 drops into both eyes 3 (three) times daily as needed for dry eyes.  . NON FORMULARY Patient uses essential oils as needed  . [DISCONTINUED] Multiple Vitamin (MULTIVITAMIN WITH MINERALS) TABS tablet Take 1 tablet by mouth daily.   No facility-administered encounter medications on file as of 02/28/2018.     Allergy: No Known Allergies  Social Hx:   Social History   Socioeconomic History  . Marital status: Married    Spouse name: Not on file  . Number of children: Not on file  . Years of education: Not on file  . Highest education level: Not on file  Occupational History  . Not on file  Social Needs  . Financial resource strain: Not on file  . Food insecurity:    Worry: Not on file    Inability: Not on file  . Transportation needs:    Medical: Not on file    Non-medical: Not on file  Tobacco Use  . Smoking status: Former Smoker    Years: 7.00    Types: Cigarettes    Last attempt to quit: 11/07/1985    Years since quitting: 32.3  . Smokeless tobacco: Never Used  Substance and Sexual Activity  . Alcohol use: No  . Drug use: No  . Sexual activity: Not on file  Lifestyle  . Physical activity:    Days per week: Not on file    Minutes per session: Not on file  . Stress: Not on file  Relationships  . Social connections:    Talks on phone: Not on file    Gets together: Not on file    Attends religious service: Not on file    Active member of club or organization: Not on file    Attends meetings of clubs or organizations: Not on file    Relationship status: Not on file  . Intimate partner violence:    Fear of current or ex partner: Not on file    Emotionally abused: Not on file    Physically abused: Not on file    Forced sexual activity: Not on file  Other Topics Concern  . Not on file  Social History Narrative  . Not on file    Past Surgical Hx:  Past Surgical History:  Procedure Laterality Date  . ABDOMINAL  HYSTERECTOMY Left 05/31/2016   Procedure: HYSTERECTOMY ABDOMINAL;  Surgeon: Everitt Amber, MD;  Location: [facility];  Service: Gynecology;  Laterality: Left;  . BREAST SURGERY  1993   left breast-calcification  . COLONOSCOPY    . LAPAROTOMY N/A 05/31/2016   Procedure: EXPLORATORY LAPAROTOMY;  Surgeon: Everitt Amber, MD;  Location: [facility];  Service: Gynecology;  Laterality: N/A;  . OMENTECTOMY  05/31/2016   Procedure: OMENTECTOMY AND STAGING FOR OVARIAN CANCER;  Surgeon: Everitt Amber, MD;  Location: [facility];  Service: Gynecology;;  . SALPINGOOPHORECTOMY Right 05/31/2016   Procedure: BILATERAL SALPINGO OOPHORECTOMY;  Surgeon: Everitt Amber, MD;  Location: [facility];  Service: Gynecology;  Laterality: Right;  . TONSILLECTOMY      Past Medical Hx:  Past Medical History:  Diagnosis Date  . 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 Dyslipidemia   . Family history of early CAD   . Genetic testing 09/28/2016   Genetic testing revealed ONE pathogenic mutation in [place]. Though a pathogenic mutation was identified, increased risk for cancer is only associated with TWO (biallelic) pathogenic mutations. No additional mutations or variants of uncertain significance were identified. Testing was performed through [employer]'s 46-gene Common Hereditary Cancers Panel. [facility]'s Common Hereditary Cancers Panel includ  . GERD (gastroesophageal reflux disease)     Past Gynecological History:  SVD x 2 No LMP recorded. Patient is postmenopausal.  Family Hx:  Family History  Problem Relation Age of Onset  . Pancreatic cancer Mother 72       d.73 - two months after diagnosis  . Addison's disease Maternal Aunt 76  . Leukemia Maternal Uncle 75       d.59 - shortly after diagnosis  . Melanoma Daughter 54    Review of Systems:  Constitutional  Feels well,    ENT Normal appearing ears and nares bilaterally Skin/Breast  No rash, sores, jaundice, itching, dryness Cardiovascular  No chest pain, shortness  of breath, or edema  Pulmonary  No cough or wheeze.  Gastro Intestinal  No nausea, vomitting, or diarrhoea. No bright red blood per rectum, no abdominal pain, change in bowel movement, or constipation.  Genito Urinary  No frequency, urgency, dysuria, no bleeding Musculo Skeletal  No myalgia, arthralgia, joint swelling or pain  Neurologic  No weakness, numbness, change in gait,  Psychology  No depression, anxiety, insomnia.   Vitals:  Blood pressure (!) 125/59, pulse 61, temperature 98.4 F (36.9 C), temperature source Oral, resp. rate 20, height 5\' 2"  (1.575 m), weight 165 lb 1.6 oz (74.9 kg), SpO2 100 %.  Physical Exam: WD in NAD Neck  Supple NROM, without any enlargements.  Lymph Node Survey No cervical supraclavicular or inguinal adenopathy Cardiovascular  Pulse normal rate, regularity and rhythm. S1 and S2 normal.  Lungs  Clear to auscultation bilateraly, without wheezes/crackles/rhonchi. Good air movement.  Skin  No rash/lesions/breakdown  Psychiatry  Alert and oriented to person, place, and time  Abdomen  Normoactive bowel sounds, abdomen soft, non-tender and nonobese. Right paramedian incisional hernia - no incarceration or tenderness.  Back No CVA tenderness Genito Urinary: vaginal cuff in tact, no lesions. No masses. She has posterior vaginal wall prolapse (rectocele). Rectal  deferred  Extremities  No bilateral cyanosis, clubbing or edema.   Thereasa Solo, MD  02/28/2018, 3:41 PM

## 2018-02-28 NOTE — Patient Instructions (Signed)
Please notify Dr Denman George at phone number [phone] if you notice vaginal bleeding, new pelvic or abdominal pains, bloating, feeling full easy, or a change in bladder or bowel function.   Please return to see Dr Denman George in December, 2019 for follow-up.

## 2018-03-05 ENCOUNTER — Telehealth: Payer: Self-pay | Admitting: Family Medicine

## 2018-03-05 NOTE — Telephone Encounter (Signed)
Called and LVM for pt regarding their scheduled appt on 12/2 with Dr. Nolon Rod. Due to the providers schedule changing, we will need to get the pt rescheduled. When pt calls back, please reschedule her for an appt at her convenience with Dr. Nolon Rod for an OV : 6 month F/U

## 2018-03-12 DIAGNOSIS — K432 Incisional hernia without obstruction or gangrene: Secondary | ICD-10-CM | POA: Diagnosis not present

## 2018-05-05 ENCOUNTER — Ambulatory Visit: Payer: BLUE CROSS/BLUE SHIELD | Admitting: Family Medicine

## 2018-05-06 ENCOUNTER — Encounter: Payer: Self-pay | Admitting: Family Medicine

## 2018-05-06 ENCOUNTER — Other Ambulatory Visit: Payer: Self-pay

## 2018-05-06 ENCOUNTER — Ambulatory Visit: Payer: BLUE CROSS/BLUE SHIELD | Admitting: Family Medicine

## 2018-05-06 VITALS — BP 120/82 | HR 58 | Temp 98.7°F | Resp 16 | Ht 62.0 in | Wt 166.6 lb

## 2018-05-06 DIAGNOSIS — G479 Sleep disorder, unspecified: Secondary | ICD-10-CM | POA: Diagnosis not present

## 2018-05-06 DIAGNOSIS — E782 Mixed hyperlipidemia: Secondary | ICD-10-CM | POA: Diagnosis not present

## 2018-05-06 DIAGNOSIS — Z23 Encounter for immunization: Secondary | ICD-10-CM

## 2018-05-06 NOTE — Patient Instructions (Addendum)
If you have lab work done today you will be contacted with your lab results within the next 2 weeks.  If you have not heard from [place] then please contact us. The fastest way to get your results is to register for My Chart.   IF you received an x-ray today, you will receive an invoice from [facility]. Please contact [facility] at [phone] with questions or concerns regarding your invoice.   IF you received labwork today, you will receive an invoice from [place]. Please contact [facility] at [phone] with questions or concerns regarding your invoice.   Our billing staff will not be able to assist you with questions regarding bills from these companies.  You will be contacted with the lab results as soon as they are available. The fastest way to get your results is to activate your My Chart account. Instructions are located on the last page of this paperwork. If you have not heard from [place] regarding the results in 2 weeks, please contact this office.     Dyslipidemia Dyslipidemia is an imbalance of waxy, fat-like substances (lipids) in the blood. The body needs lipids in small amounts. Dyslipidemia often involves a high level of cholesterol or triglycerides, which are types of lipids. Common forms of dyslipidemia include:  High levels of bad cholesterol (LDL cholesterol). LDL is the type of cholesterol that causes fatty deposits (plaques) to build up in the blood vessels that carry blood away from your heart (arteries).  Low levels of good cholesterol (HDL cholesterol). HDL cholesterol is the type of cholesterol that protects against heart disease. High levels of HDL remove the LDL buildup from arteries.  High levels of triglycerides. Triglycerides are a fatty substance in the blood that is linked to a buildup of plaques in the arteries.  You can develop dyslipidemia because of the genes you are born with (primary dyslipidemia) or changes that occur during your  life (secondary dyslipidemia), or as a side effect of certain medical treatments. What are the causes? Primary dyslipidemia is caused by changes (mutations) in genes that are passed down through families (inherited). These mutations cause several types of dyslipidemia. Mutations can result in disorders that make the body produce too much LDL cholesterol or triglycerides, or not enough HDL cholesterol. These disorders may lead to heart disease, arterial disease, or stroke at an early age. Causes of secondary dyslipidemia include certain lifestyle choices and diseases that lead to dyslipidemia, such as:  Eating a diet that is high in animal fat.  Not getting enough activity or exercise (having a sedentary lifestyle).  Having diabetes, kidney disease, liver disease, or thyroid disease.  Drinking large amounts of alcohol.  Using certain types of drugs.  What increases the risk? You may be at greater risk for dyslipidemia if you are an older man or if you are a woman who has gone through menopause. Other risk factors include:  Having a family history of dyslipidemia.  Taking certain medicines, including birth control pills, steroids, some diuretics, beta-blockers, and some medicines forHIV.  Smoking cigarettes.  Eating a high-fat diet.  Drinking large amounts of alcohol.  Having certain medical conditions such as diabetes, polycystic ovary syndrome (PCOS), pregnancy, kidney disease, liver disease, or hypothyroidism.  Not exercising regularly.  Being overweight or obese with too much belly fat.  What are the signs or symptoms? Dyslipidemia does not usually cause any symptoms. Very high lipid levels can cause fatty bumps under the skin (xanthomas) or a white or  gray ring around the black center (pupil) of the eye. Very high triglyceride levels can cause inflammation of the pancreas (pancreatitis). How is this diagnosed? Your health care provider may diagnose dyslipidemia based on a  routine blood test (fasting blood test). Because most people do not have symptoms of the condition, this blood testing (lipid profile) is done on adults age 52 and older and is repeated every 5 years. This test checks:  Total cholesterol. This is a measure of the total amount of cholesterol in your blood, including LDL cholesterol, HDL cholesterol, and triglycerides. A healthy number is below 200.  LDL cholesterol. The target number for LDL cholesterol is different for each person, depending on individual risk factors. For most people, a number below 100 is healthy. Ask your health care provider what your LDL cholesterol number should be.  HDL cholesterol. An HDL level of 60 or higher is best because it helps to protect against heart disease. A number below 62 for men or below 4 for women increases the risk for heart disease.  Triglycerides. A healthy triglyceride number is below 150.  If your lipid profile is abnormal, your health care provider may do other blood tests to get more information about your condition. How is this treated? Treatment depends on the type of dyslipidemia that you have and your other risk factors for heart disease and stroke. Your health care provider will have a target range for your lipid levels based on this information. For many people, treatment starts with lifestyle changes, such as diet and exercise. Your health care provider may recommend that you:  Get regular exercise.  Make changes to your diet.  Quit smoking if you smoke.  If diet changes and exercise do not help you reach your goals, your health care provider may also prescribe medicine to lower lipids. The most commonly prescribed type of medicine lowers your LDL cholesterol (statin drug). If you have a high triglyceride level, your provider may prescribe another type of drug (fibrate) or an omega-3 fish oil supplement, or both. Follow these instructions at home:  Take over-the-counter and prescription  medicines only as told by your health care provider. This includes supplements.  Get regular exercise. Start an aerobic exercise and strength training program as told by your health care provider. Ask your health care provider what activities are safe for you. Your health care provider may recommend: ? 30 minutes of aerobic activity 4-6 days a week. Brisk walking is an example of aerobic activity. ? Strength training 2 days a week.  Eat a healthy diet as told by your health care provider. This can help you reach and maintain a healthy weight, lower your LDL cholesterol, and raise your HDL cholesterol. It may help to work with a diet and nutrition specialist (dietitian) to make a plan that is right for you. Your dietitian or health care provider may recommend: ? Limiting your calories, if you are overweight. ? Eating more fruits, vegetables, whole grains, fish, and lean meats. ? Limiting saturated fat, trans fat, and cholesterol.  Follow instructions from your health care provider or dietitian about eating or drinking restrictions.  Limit alcohol intake to no more than one drink per day for nonpregnant women and two drinks per day for men. One drink equals 12 oz of beer, 5 oz of wine, or 1 oz of hard liquor.  Do not use any products that contain nicotine or tobacco, such as cigarettes and e-cigarettes. If you need help quitting, ask your health  care provider.  Keep all follow-up visits as told by your health care provider. This is important. Contact a health care provider if:  You are having trouble sticking to your exercise or diet plan.  You are struggling to quit smoking or control your use of alcohol. Summary  Dyslipidemia is an imbalance of waxy, fat-like substances (lipids) in the blood. The body needs lipids in small amounts. Dyslipidemia often involves a high level of cholesterol or triglycerides, which are types of lipids.  Treatment depends on the type of dyslipidemia that you  have and your other risk factors for heart disease and stroke.  For many people, treatment starts with lifestyle changes, such as diet and exercise. Your health care provider may also prescribe medicine to lower lipids. This information is not intended to replace advice given to you by your health care provider. Make sure you discuss any questions you have with your health care provider. Document Released: 05/26/2013 Document Revised: 01/16/2016 Document Reviewed: 01/16/2016 Elsevier Interactive Patient Education  [employer].

## 2018-05-06 NOTE — Progress Notes (Signed)
Established Patient Office Visit  Subjective:  Patient ID: Theresa Frazier, female    DOB: [birth year]  Age: 62 y.o. MRN: [id number]  CC:  Chief Complaint  Patient presents with  . 6 month f/u mixed hyperlipidemia    pt has questions about lab work and sleep issues.  Pt is wanting flu vaccine today    HPI Theresa Frazier presents for   Dyslipidemia: Patient presents for evaluation of lipids.  Compliance with treatment thus far has been good with healthy lifestyle.  A repeat fasting lipid profile was done.  The patient does not use medications that may worsen dyslipidemias (corticosteroids, progestins, anabolic steroids, diuretics, beta-blockers, amiodarone, cyclosporine, olanzapine). The patient exercises daily.  The patient is not known to have coexisting coronary artery disease.    Interrupted sleep Patient reports that she has some sleep concerns She reports that she was worried as to wether she was getting adequate sleep She reports that she goes to sleep with headphones listening to books   Past Medical History:  Diagnosis Date  . 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 Dyslipidemia   . Family history of early CAD   . 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 Though a pathogenic mutation was identified, increased risk for cancer is only associated with TWO (biallelic) pathogenic mutations. No additional mutations or variants of uncertain significance were identified. Testing was performed through [employer]'s 46-gene Common Hereditary Cancers Panel. [facility]'s Common Hereditary Cancers Panel includ  . GERD (gastroesophageal reflux disease)     Past Surgical History:  Procedure Laterality Date  . ABDOMINAL HYSTERECTOMY Left 05/31/2016   Procedure: HYSTERECTOMY ABDOMINAL;  Surgeon: Everitt Amber, MD;  Location: [facility];  Service: Gynecology;  Laterality: Left;  . BREAST SURGERY  1993   left breast-calcification    . COLONOSCOPY    . LAPAROTOMY N/A 05/31/2016   Procedure: EXPLORATORY LAPAROTOMY;  Surgeon: Everitt Amber, MD;  Location: [facility];  Service: Gynecology;  Laterality: N/A;  . OMENTECTOMY  05/31/2016   Procedure: OMENTECTOMY AND STAGING FOR OVARIAN CANCER;  Surgeon: Everitt Amber, MD;  Location: [facility];  Service: Gynecology;;  . SALPINGOOPHORECTOMY Right 05/31/2016   Procedure: BILATERAL SALPINGO OOPHORECTOMY;  Surgeon: Everitt Amber, MD;  Location: [facility];  Service: Gynecology;  Laterality: Right;  . TONSILLECTOMY      Family History  Problem Relation Age of Onset  . Pancreatic cancer Mother 21       d.73 - two months after diagnosis  . Addison's disease Maternal Aunt 76  . Leukemia Maternal Uncle 57       d.59 - shortly after diagnosis  . Melanoma Daughter 20    Social History   Socioeconomic History  . Marital status: Married    Spouse name: Not on file  . Number of children: Not on file  . Years of education: Not on file  . Highest education level: Not on file  Occupational History  . Not on file  Social Needs  . Financial resource strain: Not on file  . Food insecurity:    Worry: Not on file    Inability: Not on file  . Transportation needs:    Medical: Not on file    Non-medical: Not on file  Tobacco Use  . Smoking status: Former Smoker    Years: 7.00    Types: Cigarettes    Last attempt to quit: 11/07/1985    Years since quitting: 32.5  .  Smokeless tobacco: Never Used  Substance and Sexual Activity  . Alcohol use: No  . Drug use: No  . Sexual activity: Not on file  Lifestyle  . Physical activity:    Days per week: Not on file    Minutes per session: Not on file  . Stress: Not on file  Relationships  . Social connections:    Talks on phone: Not on file    Gets together: Not on file    Attends religious service: Not on file    Active member of club or organization: Not on file    Attends meetings of clubs or organizations: Not on file    Relationship status: Not  on file  . Intimate partner violence:    Fear of current or ex partner: Not on file    Emotionally abused: Not on file    Physically abused: Not on file    Forced sexual activity: Not on file  Other Topics Concern  . Not on file  Social History Narrative  . Not on file    Outpatient Medications Prior to Visit  Medication Sig Dispense Refill  . APPLE CIDER VINEGAR PO Take 2 mLs by mouth.    . Black Currant Seed Oil 500 MG CAPS Take by mouth.    . conjugated estrogens (PREMARIN) vaginal cream Place 1 Applicatorful vaginally 3 (three) times a week. 42.5 g 12  . FLORA-Q (FLORA-Q) CAPS capsule Take 1 capsule by mouth daily.    . hydroxypropyl methylcellulose / hypromellose (ISOPTO TEARS / GONIOVISC) 2.5 % ophthalmic solution Place 1-2 drops into both eyes 3 (three) times daily as needed for dry eyes.    . Multiple Vitamin (MULTIVITAMIN) tablet Take 1 tablet by mouth daily.    . NON FORMULARY Patient uses essential oils as needed     No facility-administered medications prior to visit.     No Known Allergies  ROS Review of Systems Review of Systems  Constitutional: Negative for activity change, appetite change, chills and fever.  HENT: Negative for congestion, nosebleeds, trouble swallowing and voice change.   Respiratory: Negative for cough, shortness of breath and wheezing.   Gastrointestinal: Negative for diarrhea, nausea and vomiting.  Genitourinary: Negative for difficulty urinating, dysuria, flank pain and hematuria.  Musculoskeletal: Negative for back pain, joint swelling and neck pain.  Neurological: Negative for dizziness, speech difficulty, light-headedness and numbness.  See HPI. All other review of systems negative.     Objective:    BP 120/82 (BP Location: Right Arm, Patient Position: Sitting, Cuff Size: Normal)   Pulse (!) 58   Temp 98.7 F (37.1 C) (Oral)   Resp 16   Ht _0  (1.575 m)   Wt 166 lb 9.6 oz (75.6 kg)   SpO2 97%   BMI 30.47 kg/m  Wt Readings  from Last 3 Encounters:  05/06/18 166 lb 9.6 oz (75.6 kg)  02/28/18 165 lb 1.6 oz (74.9 kg)  11/13/17 167 lb 9.6 oz (76 kg)    Physical Exam  Physical Exam  Constitutional: Oriented to person, place, and time. Appears well-developed and well-nourished.  HENT:  Head: Normocephalic and atraumatic.  Eyes: Conjunctivae and EOM are normal.  Cardiovascular: Normal rate, regular rhythm, normal heart sounds and intact distal pulses.  No murmur heard. Pulmonary/Chest: Effort normal and breath sounds normal. No stridor. No respiratory distress. Has no wheezes.  Neurological: Is alert and oriented to person, place, and time.  Skin: Skin is warm. Capillary refill takes less than 2 seconds.  Psychiatric: Has a normal mood and affect. Behavior is normal. Judgment and thought content normal.    Health Maintenance Due  Topic Date Due  . INFLUENZA VACCINE  01/02/2018    There are no preventive care reminders to display for this patient.  Lab Results  Component Value Date   TSH 1.760 01/29/2017   Lab Results  Component Value Date   WBC 10.1 06/01/2016   HGB 11.0 (L) 06/01/2016   HCT 33.0 (L) 06/01/2016   MCV 93.0 06/01/2016   PLT 237 06/01/2016   Lab Results  Component Value Date   NA 141 11/07/2017   K 4.2 11/07/2017   CHLORIDE 106 11/21/2016   CO2 28 11/07/2017   GLUCOSE 98 11/07/2017   BUN 16 11/07/2017   CREATININE 0.78 11/07/2017   BILITOT 0.4 01/29/2017   ALKPHOS 93 01/29/2017   AST 18 01/29/2017   ALT 16 01/29/2017   PROT 6.5 01/29/2017   ALBUMIN 4.4 01/29/2017   CALCIUM 9.8 11/07/2017   ANIONGAP 8 11/07/2017   EGFR 78 (L) 11/21/2016   Lab Results  Component Value Date   CHOL 237 (H) 10/30/2017   Lab Results  Component Value Date   HDL 72 10/30/2017   Lab Results  Component Value Date   LDLCALC 153 (H) 10/30/2017   Lab Results  Component Value Date   TRIG 58 10/30/2017   Lab Results  Component Value Date   CHOLHDL 3.3 10/30/2017   Lab Results    Component Value Date   HGBA1C 5.6 10/30/2017      Assessment & Plan:   Problem List Items Addressed This Visit      Other   Hyperlipidemia - Primary    Discussed lipid goals and triglycerides. HLD in good range.       Relevant Orders   CMP14+EGFR   Lipid panel   Sleep disturbance    Discussed sleep hygiene and how to avoid interrupting sleep. No more listening to audiobooks       Other Visit Diagnoses    Need for prophylactic vaccination and inoculation against influenza          No orders of the defined types were placed in this encounter.   Follow-up: No follow-ups on file.    Forrest Moron, MD

## 2018-05-06 NOTE — Assessment & Plan Note (Signed)
Discussed sleep hygiene and how to avoid interrupting sleep. No more listening to [employer]

## 2018-05-06 NOTE — Assessment & Plan Note (Signed)
Discussed lipid goals and triglycerides. HLD in good range.

## 2018-05-07 LAB — CMP14+EGFR
A/G RATIO: 2.8 — AB (ref 1.2–2.2)
ALT: 13 IU/L (ref 0–32)
AST: 10 IU/L (ref 0–40)
Albumin: 4.5 g/dL (ref 3.6–4.8)
Alkaline Phosphatase: 83 IU/L (ref 39–117)
BUN/Creatinine Ratio: 13 (ref 12–28)
BUN: 10 mg/dL (ref 8–27)
Bilirubin Total: 0.3 mg/dL (ref 0.0–1.2)
CALCIUM: 9.7 mg/dL (ref 8.7–10.3)
CO2: 24 mmol/L (ref 20–29)
Chloride: 104 mmol/L (ref 96–106)
Creatinine, Ser: 0.78 mg/dL (ref 0.57–1.00)
GFR calc Af Amer: 94 mL/min/{1.73_m2} (ref >59)
GFR, EST NON AFRICAN AMERICAN: 81 mL/min/{1.73_m2} (ref >59)
GLUCOSE: 93 mg/dL (ref 65–99)
Globulin, Total: 1.6 g/dL (ref 1.5–4.5)
POTASSIUM: 4.3 mmol/L (ref 3.5–5.2)
Sodium: 143 mmol/L (ref 134–144)
Total Protein: 6.1 g/dL (ref 6.0–8.5)

## 2018-05-07 LAB — LIPID PANEL
CHOL/HDL RATIO: 3.2 ratio (ref 0.0–4.4)
Cholesterol, Total: 251 mg/dL — ABNORMAL HIGH (ref 100–199)
HDL: 78 mg/dL (ref >39)
LDL Calculated: 157 mg/dL — ABNORMAL HIGH (ref 0–99)
TRIGLYCERIDES: 78 mg/dL (ref 0–149)
VLDL Cholesterol Cal: 16 mg/dL (ref 5–40)

## 2018-05-26 ENCOUNTER — Inpatient Hospital Stay: Payer: BLUE CROSS/BLUE SHIELD | Attending: Gynecologic Oncology

## 2018-05-26 DIAGNOSIS — Z90710 Acquired absence of both cervix and uterus: Secondary | ICD-10-CM | POA: Insufficient documentation

## 2018-05-26 DIAGNOSIS — C561 Malignant neoplasm of right ovary: Secondary | ICD-10-CM | POA: Diagnosis not present

## 2018-05-26 DIAGNOSIS — Z90722 Acquired absence of ovaries, bilateral: Secondary | ICD-10-CM | POA: Diagnosis not present

## 2018-05-26 DIAGNOSIS — C569 Malignant neoplasm of unspecified ovary: Secondary | ICD-10-CM

## 2018-05-26 DIAGNOSIS — N816 Rectocele: Secondary | ICD-10-CM | POA: Diagnosis not present

## 2018-05-26 LAB — CEA (IN HOUSE-CHCC): CEA (CHCC-IN HOUSE): 1.31 ng/mL (ref 0.00–5.00)

## 2018-05-27 LAB — CA 125: Cancer Antigen (CA) 125: 10 U/mL (ref 0.0–38.1)

## 2018-05-30 ENCOUNTER — Inpatient Hospital Stay (HOSPITAL_BASED_OUTPATIENT_CLINIC_OR_DEPARTMENT_OTHER): Payer: BLUE CROSS/BLUE SHIELD | Admitting: Gynecologic Oncology

## 2018-05-30 ENCOUNTER — Encounter: Payer: Self-pay | Admitting: Gynecologic Oncology

## 2018-05-30 VITALS — BP 134/86 | HR 76 | Temp 98.4°F | Resp 18 | Ht 62.0 in | Wt 169.0 lb

## 2018-05-30 DIAGNOSIS — C561 Malignant neoplasm of right ovary: Secondary | ICD-10-CM | POA: Diagnosis not present

## 2018-05-30 DIAGNOSIS — Z90710 Acquired absence of both cervix and uterus: Secondary | ICD-10-CM

## 2018-05-30 DIAGNOSIS — Z90722 Acquired absence of ovaries, bilateral: Secondary | ICD-10-CM

## 2018-05-30 DIAGNOSIS — N816 Rectocele: Secondary | ICD-10-CM

## 2018-05-30 NOTE — Patient Instructions (Signed)
Please notify Dr Christne Platts at phone number [phone] if you notice vaginal bleeding, new pelvic or abdominal pains, bloating, feeling full easy, or a change in bladder or bowel function.  Please return to see Dr Anyssa Sharpless in 6 months.  

## 2018-05-30 NOTE — Progress Notes (Signed)
Follow-up Note: Gyn-Onc  Consult was requested by Dr. Marisue Humble and Dr Laurann Montana for the evaluation of Theresa Frazier 62 y.o. female  CC:  Chief Complaint  Patient presents with  . Ovarian cancer on right [facility])    Assessment/Plan:  Theresa Frazier  is a 62 y.o.  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 No measurable disease.   I discussed the risk for recurrence associated with this disease. I discussed symptoms consistent with recurrence and for her to notify me of these should they occur.  I recommend 6 monthly evaluations with the next in June, 2020 with CEA and CA 125.  Rectocele and vaginal dryness - treated with premarin. S/p consult with Dr Zigmund Daniel.   Ventral hernia - seeing Dr Grandville Silos for this.    HPI: Theresa Frazier is a 62 year old parous (P2) woman who is seen in consultation at the request of Dr Marisue Humble and Dr Laurann Montana for a complex cystic and solid pelvic mass.  The patient reported to her PCP's that she had abdominal bloating and fullness for 6-8 weeks since October 2017 when she saw them on 04/25/16. They performed a pelvic examination on that visit and appreciated a pelvic mass. A TVUS was ordered and performed on 05/04/16 and revealed a uterus measuring 8.4x3.7x4.8cm with a 7cm endometrial stripe (she has had no bleeding symptoms) and the ovaries were not discretely seen. Instead, there is a large complex cystic and solid mass at midline, extending into the right and left adnexum with internal blood flow. The mass measures 11 x 7.4 x 14.1 cm. Separate ovaries are not identified.  A CT scan had been ordered but canceled upon the US findings and plan for consultation with me.  She is feeling fairly well. Her pants fit differently lately and she feels she may have lost weight. Her bladder and bowels are moving normally.  She denies pain.  She has no family history for breast/ovarian cancer or any other significant  cancer history.  Her surgical history is significant for tonsillectomy only (no abdominal surgery).  CT scan on 05/16/16 showed: large (15cm) complex mass in the central anatomic pelvis appears to arise from the right ovary and is highly worrisome for a cystic ovarian carcinoma and a small amount of pelvic free fluid. CA 125 on 05/14/16 was elevated at 72.  On 05/31/16 she went to the OR for an exploratory laparotomy, TAH, BSO, omentectomy and peritoneal biopsies (no lymphadenectomy due to frozen section showing mucinous cell type).Intraoperative findings were significant for a 15cm right ovarian mass non-adherent, and otherwise normal peritoneal cavity and appendix. Frozen showed mucinous adencarcinoma.  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  Interval Hx: She has been feeling strange abdominal symptoms in the lower abdomen - feels like prediagnosis. Not sharp pains no emesis or nausea.   CA 125 on 11/15/16 was normal at 11.9 CEA on 11/15/16 was normal at <1  CA 125 on 02/18/17 was normal at 14 CEA on 02/18/17 was normal at 1  CA 125 on 05/24/17 was normal at 11.6 CEA on 05/24/17 was normal at <1  CA 125 on 07/29/17 was normal at 13.3 CEA on 07/29/17 was normal at <1  CA 125 on 11/07/17 was normal at 12.4 CEA on 11/07/17 was normal at <1.   CT abd/pelvis and chest on 08/16/17 showed no evidence of recurrence and stable  pulm and peritoneal nodules.   CA 125 on 02/24/18 was normal at 10 CEA on 02/24/18 was normal at <1.   Current Meds:  Outpatient Encounter Medications as of 05/30/2018  Medication Sig  . APPLE CIDER VINEGAR PO Take 2 mLs by mouth.  . Black Currant Seed Oil 500 MG CAPS Take by mouth.  . conjugated estrogens (PREMARIN) vaginal cream Place 1 Applicatorful vaginally 3 (three) times a week.  Marland Kitchen FLORA-Q (FLORA-Q) CAPS capsule Take 1 capsule by mouth daily.  . hydroxypropyl  methylcellulose / hypromellose (ISOPTO TEARS / GONIOVISC) 2.5 % ophthalmic solution Place 1-2 drops into both eyes 3 (three) times daily as needed for dry eyes.  . Multiple Vitamin (MULTIVITAMIN) tablet Take 1 tablet by mouth daily.  . NON FORMULARY Patient uses essential oils as needed   No facility-administered encounter medications on file as of 05/30/2018.     Allergy: No Known Allergies  Social Hx:   Social History   Socioeconomic History  . Marital status: Married    Spouse name: Not on file  . Number of children: Not on file  . Years of education: Not on file  . Highest education level: Not on file  Occupational History  . Not on file  Social Needs  . Financial resource strain: Not on file  . Food insecurity:    Worry: Not on file    Inability: Not on file  . Transportation needs:    Medical: Not on file    Non-medical: Not on file  Tobacco Use  . Smoking status: Former Smoker    Years: 7.00    Types: Cigarettes    Last attempt to quit: 11/07/1985    Years since quitting: 32.5  . Smokeless tobacco: Never Used  Substance and Sexual Activity  . Alcohol use: No  . Drug use: No  . Sexual activity: Not on file  Lifestyle  . Physical activity:    Days per week: Not on file    Minutes per session: Not on file  . Stress: Not on file  Relationships  . Social connections:    Talks on phone: Not on file    Gets together: Not on file    Attends religious service: Not on file    Active member of club or organization: Not on file    Attends meetings of clubs or organizations: Not on file    Relationship status: Not on file  . Intimate partner violence:    Fear of current or ex partner: Not on file    Emotionally abused: Not on file    Physically abused: Not on file    Forced sexual activity: Not on file  Other Topics Concern  . Not on file  Social History Narrative  . Not on file    Past Surgical Hx:  Past Surgical History:  Procedure Laterality Date  .  ABDOMINAL HYSTERECTOMY Left 05/31/2016   Procedure: HYSTERECTOMY ABDOMINAL;  Surgeon: Everitt Amber, MD;  Location: [facility];  Service: Gynecology;  Laterality: Left;  . BREAST SURGERY  1993   left breast-calcification  . COLONOSCOPY    . LAPAROTOMY N/A 05/31/2016   Procedure: EXPLORATORY LAPAROTOMY;  Surgeon: Everitt Amber, MD;  Location: [facility];  Service: Gynecology;  Laterality: N/A;  . OMENTECTOMY  05/31/2016   Procedure: OMENTECTOMY AND STAGING FOR OVARIAN CANCER;  Surgeon: Everitt Amber, MD;  Location: [facility];  Service: Gynecology;;  . SALPINGOOPHORECTOMY Right 05/31/2016   Procedure: BILATERAL SALPINGO OOPHORECTOMY;  Surgeon: Everitt Amber, MD;  Location: [facility];  Service: Gynecology;  Laterality: Right;  . TONSILLECTOMY      Past Medical Hx:  Past Medical History:  Diagnosis Date  . 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 Dyslipidemia   . Family history of early CAD   . Genetic testing 09/28/2016   Genetic testing revealed ONE pathogenic mutation in [place]. Though a pathogenic mutation was identified, increased risk for cancer is only associated with TWO (biallelic) pathogenic mutations. No additional mutations or variants of uncertain significance were identified. Testing was performed through [employer]'s 46-gene Common Hereditary Cancers Panel. [facility]'s Common Hereditary Cancers Panel includ  . GERD (gastroesophageal reflux disease)     Past Gynecological History:  SVD x 2 No LMP recorded. Patient has had a hysterectomy.  Family Hx:  Family History  Problem Relation Age of Onset  . Pancreatic cancer Mother 47       d.73 - two months after diagnosis  . Addison's disease Maternal Aunt 76  . Leukemia Maternal Uncle 11       d.59 - shortly after diagnosis  . Melanoma Daughter 28    Review of Systems:  Constitutional  Feels well,    ENT Normal appearing ears and nares bilaterally Skin/Breast  No rash, sores, jaundice, itching, dryness Cardiovascular  No chest  pain, shortness of breath, or edema  Pulmonary  No cough or wheeze.  Gastro Intestinal  No nausea, vomitting, or diarrhoea. No bright red blood per rectum, no abdominal pain, change in bowel movement, or constipation.  Genito Urinary  No frequency, urgency, dysuria, no bleeding Musculo Skeletal  No myalgia, arthralgia, joint swelling or pain  Neurologic  No weakness, numbness, change in gait,  Psychology  No depression, anxiety, insomnia.   Vitals:  Blood pressure 134/86, pulse 76, temperature 98.4 F (36.9 C), temperature source Oral, resp. rate 18, height 5\' 2"  (1.575 m), weight 169 lb (76.7 kg), SpO2 98 %.  Physical Exam: WD in NAD Neck  Supple NROM, without any enlargements.  Lymph Node Survey No cervical supraclavicular or inguinal adenopathy Cardiovascular  Pulse normal rate, regularity and rhythm. S1 and S2 normal.  Lungs  Clear to auscultation bilateraly, without wheezes/crackles/rhonchi. Good air movement.  Skin  No rash/lesions/breakdown  Psychiatry  Alert and oriented to person, place, and time  Abdomen  Normoactive bowel sounds, abdomen soft, non-tender and nonobese. Right paramedian incisional hernia - no incarceration or tenderness.  Back No CVA tenderness Genito Urinary: vaginal cuff in tact, no lesions. No masses. She has posterior vaginal wall prolapse (rectocele). Rectal  deferred  Extremities  No bilateral cyanosis, clubbing or edema.   Thereasa Solo, MD  05/30/2018, 12:22 PM

## 2018-07-01 ENCOUNTER — Ambulatory Visit: Payer: BLUE CROSS/BLUE SHIELD | Admitting: Family Medicine

## 2018-07-01 ENCOUNTER — Encounter: Payer: Self-pay | Admitting: Family Medicine

## 2018-07-01 ENCOUNTER — Other Ambulatory Visit: Payer: Self-pay

## 2018-07-01 VITALS — BP 131/78 | HR 71 | Temp 98.4°F | Resp 14 | Ht 62.0 in | Wt 169.8 lb

## 2018-07-01 DIAGNOSIS — J329 Chronic sinusitis, unspecified: Secondary | ICD-10-CM

## 2018-07-01 DIAGNOSIS — J069 Acute upper respiratory infection, unspecified: Secondary | ICD-10-CM | POA: Diagnosis not present

## 2018-07-01 MED ORDER — AMOXICILLIN-POT CLAVULANATE 875-125 MG PO TABS: 1.0000 | ORAL_TABLET | Freq: Two times a day (BID) | ORAL | 0 refills | Status: DC

## 2018-07-01 NOTE — Patient Instructions (Addendum)
I suspect you do have a component of early sinus infection based on symptoms.  Continue Neti pot or saline nasal spray throughout the day, make sure to drink plenty of fluids.  Start Augmentin 1 pill twice per day.  See other information below.  Thank you for coming in today.   Sinusitis, Adult Sinusitis is inflammation of your sinuses. Sinuses are hollow spaces in the bones around your face. Your sinuses are located:  Around your eyes.  In the middle of your forehead.  Behind your nose.  In your cheekbones. Mucus normally drains out of your sinuses. When your nasal tissues become inflamed or swollen, mucus can become trapped or blocked. This allows bacteria, viruses, and fungi to grow, which leads to infection. Most infections of the sinuses are caused by a virus. Sinusitis can develop quickly. It can last for up to 4 weeks (acute) or for more than 12 weeks (chronic). Sinusitis often develops after a cold. What are the causes? This condition is caused by anything that creates swelling in the sinuses or stops mucus from draining. This includes:  Allergies.  Asthma.  Infection from bacteria or viruses.  Deformities or blockages in your nose or sinuses.  Abnormal growths in the nose (nasal polyps).  Pollutants, such as chemicals or irritants in the air.  Infection from fungi (rare). What increases the risk? You are more likely to develop this condition if you:  Have a weak body defense system (immune system).  Do a lot of swimming or diving.  Overuse nasal sprays.  Smoke. What are the signs or symptoms? The main symptoms of this condition are pain and a feeling of pressure around the affected sinuses. Other symptoms include:  Stuffy nose or congestion.  Thick drainage from your nose.  Swelling and warmth over the affected sinuses.  Headache.  Upper toothache.  A cough that may get worse at night.  Extra mucus that collects in the throat or the back of the nose  (postnasal drip).  Decreased sense of smell and taste.  Fatigue.  A fever.  Sore throat.  Bad breath. How is this diagnosed? This condition is diagnosed based on:  Your symptoms.  Your medical history.  A physical exam.  Tests to find out if your condition is acute or chronic. This may include: ? Checking your nose for nasal polyps. ? Viewing your sinuses using a device that has a light (endoscope). ? Testing for allergies or bacteria. ? Imaging tests, such as an MRI or CT scan. In rare cases, a bone biopsy may be done to rule out more serious types of fungal sinus disease. How is this treated? Treatment for sinusitis depends on the cause and whether your condition is chronic or acute.  If caused by a virus, your symptoms should go away on their own within 10 days. You may be given medicines to relieve symptoms. They include: ? Medicines that shrink swollen nasal passages (topical intranasal decongestants). ? Medicines that treat allergies (antihistamines). ? A spray that eases inflammation of the nostrils (topical intranasal corticosteroids). ? Rinses that help get rid of thick mucus in your nose (nasal saline washes).  If caused by bacteria, your health care provider may recommend waiting to see if your symptoms improve. Most bacterial infections will get better without antibiotic medicine. You may be given antibiotics if you have: ? A severe infection. ? A weak immune system.  If caused by narrow nasal passages or nasal polyps, you may need to have surgery. Follow  these instructions at home: Medicines  Take, use, or apply over-the-counter and prescription medicines only as told by your health care provider. These may include nasal sprays.  If you were prescribed an antibiotic medicine, take it as told by your health care provider. Do not stop taking the antibiotic even if you start to feel better. Hydrate and humidify   Drink enough fluid to keep your urine pale  yellow. Staying hydrated will help to thin your mucus.  Use a cool mist humidifier to keep the humidity level in your home above 50%.  Inhale steam for 10-15 minutes, 3-4 times a day, or as told by your health care provider. You can do this in the bathroom while a hot shower is running.  Limit your exposure to cool or dry air. Rest  Rest as much as possible.  Sleep with your head raised (elevated).  Make sure you get enough sleep each night. General instructions   Apply a warm, moist washcloth to your face 3-4 times a day or as told by your health care provider. This will help with discomfort.  Wash your hands often with soap and water to reduce your exposure to germs. If soap and water are not available, use hand sanitizer.  Do not smoke. Avoid being around people who are smoking (secondhand smoke).  Keep all follow-up visits as told by your health care provider. This is important. Contact a health care provider if:  You have a fever.  Your symptoms get worse.  Your symptoms do not improve within 10 days. Get help right away if:  You have a severe headache.  You have persistent vomiting.  You have severe pain or swelling around your face or eyes.  You have vision problems.  You develop confusion.  Your neck is stiff.  You have trouble breathing. Summary  Sinusitis is soreness and inflammation of your sinuses. Sinuses are hollow spaces in the bones around your face.  This condition is caused by nasal tissues that become inflamed or swollen. The swelling traps or blocks the flow of mucus. This allows bacteria, viruses, and fungi to grow, which leads to infection.  If you were prescribed an antibiotic medicine, take it as told by your health care provider. Do not stop taking the antibiotic even if you start to feel better.  Keep all follow-up visits as told by your health care provider. This is important. This information is not intended to replace advice given to  you by your health care provider. Make sure you discuss any questions you have with your health care provider. Document Released: 05/21/2005 Document Revised: 10/21/2017 Document Reviewed: 10/21/2017 Elsevier Interactive Patient Education  [employer].   If you have lab work done today you will be contacted with your lab results within the next 2 weeks.  If you have not heard from [place] then please contact us. The fastest way to get your results is to register for My Chart.   IF you received an x-ray today, you will receive an invoice from [facility]. Please contact [facility] at [phone] with questions or concerns regarding your invoice.   IF you received labwork today, you will receive an invoice from [facility]. Please contact [facility] at [phone] with questions or concerns regarding your invoice.   Our billing staff will not be able to assist you with questions regarding bills from these companies.  You will be contacted with the lab results as soon as they are available. The fastest way to get  your results is to activate your My Chart account. Instructions are located on the last page of this paperwork. If you have not heard from [place] regarding the results in 2 weeks, please contact this office.

## 2018-07-01 NOTE — Progress Notes (Signed)
Subjective:    Patient ID: Theresa Frazier, female    DOB: 20-Aug-1955, 63 y.o.   MRN: [id number]  HPI Theresa Frazier is a 63 y.o. female Presents today for: Chief Complaint  Patient presents with  . URI    fever, green mucus, sinus pain x 11 days   Initially scratchy throat starting 11 days ago. Proceeded to cough. Possible low grade fever in afternoon. Did not measure. Nasal congestion, but treated with neti pot and decongestant. Discolored nasal d/c since this weekend. Started to improve some last week during the day, then sx's return. Head congestion, aches. Head pressure and ears clogged this morning.  Main concern is pressure in head, frontal HA. No tooth pain. Aches and nasal d/c during the day.  Min cough.   Tx: decongestant, neti pot, garlic, elderberry, honey.   Sick contacts at work.   Engineer, maintenance (IT), sedentary job.   Patient Active Problem List   Diagnosis Date Noted  . Sleep disturbance 05/06/2018  . 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 Rectocele 11/19/2016  . Nodule of apex of right lung 11/19/2016  . Genetic testing 09/28/2016  . Ovarian cancer on right ([place]) 05/31/2016  . Ovarian cancer ([place]) 05/31/2016  . Pelvic mass 05/14/2016  . Hyperlipidemia 11/07/2012  . Fatigue 11/07/2012  . Family history of early CAD 11/07/2012   Past Medical History:  Diagnosis Date  . 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 Dyslipidemia   . Family history of early CAD   . Genetic testing 09/28/2016   Genetic testing revealed ONE pathogenic mutation in [place]. Though a pathogenic mutation was identified, increased risk for cancer is only associated with TWO (biallelic) pathogenic mutations. No additional mutations or variants of uncertain significance were identified. Testing was performed through [facility]'s 46-gene Common Hereditary Cancers Panel. [facility]'s Common Hereditary Cancers Panel includ  . GERD (gastroesophageal reflux disease)     Past Surgical History:  Procedure Laterality Date  . ABDOMINAL HYSTERECTOMY Left 05/31/2016   Procedure: HYSTERECTOMY ABDOMINAL;  Surgeon: Everitt Amber, MD;  Location: [facility];  Service: Gynecology;  Laterality: Left;  . BREAST SURGERY  1993   left breast-calcification  . COLONOSCOPY    . LAPAROTOMY N/A 05/31/2016   Procedure: EXPLORATORY LAPAROTOMY;  Surgeon: Everitt Amber, MD;  Location: [facility];  Service: Gynecology;  Laterality: N/A;  . OMENTECTOMY  05/31/2016   Procedure: OMENTECTOMY AND STAGING FOR OVARIAN CANCER;  Surgeon: Everitt Amber, MD;  Location: [facility];  Service: Gynecology;;  . SALPINGOOPHORECTOMY Right 05/31/2016   Procedure: BILATERAL SALPINGO OOPHORECTOMY;  Surgeon: Everitt Amber, MD;  Location: [facility];  Service: Gynecology;  Laterality: Right;  . TONSILLECTOMY     No Known Allergies Prior to Admission medications   Medication Sig Start Date End Date Taking? Authorizing Provider  Biotin 10 MG CAPS Take by mouth.   Yes [provider]  Multiple Minerals (JOINT HEALTH MINERAL PO) Take by mouth.   Yes [provider]  APPLE CIDER VINEGAR PO Take 2 mLs by mouth.    [provider]  Black Currant Seed Oil 500 MG CAPS Take by mouth.    [provider]  conjugated estrogens (PREMARIN) vaginal cream Place 1 Applicatorful vaginally 3 (three) times a week. 11/19/16   Cross, Lenna Sciara D, NP  FLORA-Q (FLORA-Q) CAPS capsule Take 1 capsule by mouth daily.    [provider]  hydroxypropyl methylcellulose / hypromellose (ISOPTO TEARS / GONIOVISC) 2.5 % ophthalmic  solution Place 1-2 drops into both eyes 3 (three) times daily as needed for dry eyes.    [provider]  Multiple Vitamin (MULTIVITAMIN) tablet Take 1 tablet by mouth daily.    [provider]  NON FORMULARY Patient uses essential oils as needed    [provider]   Social History   Socioeconomic History  . Marital status: Married    Spouse name: Not on file  .  Number of children: Not on file  . Years of education: Not on file  . Highest education level: Not on file  Occupational History  . Not on file  Social Needs  . Financial resource strain: Not on file  . Food insecurity:    Worry: Not on file    Inability: Not on file  . Transportation needs:    Medical: Not on file    Non-medical: Not on file  Tobacco Use  . Smoking status: Former Smoker    Years: 7.00    Types: Cigarettes    Last attempt to quit: 11/07/1985    Years since quitting: 32.6  . Smokeless tobacco: Never Used  Substance and Sexual Activity  . Alcohol use: No  . Drug use: No  . Sexual activity: Not on file  Lifestyle  . Physical activity:    Days per week: Not on file    Minutes per session: Not on file  . Stress: Not on file  Relationships  . Social connections:    Talks on phone: Not on file    Gets together: Not on file    Attends religious service: Not on file    Active member of club or organization: Not on file    Attends meetings of clubs or organizations: Not on file    Relationship status: Not on file  . Intimate partner violence:    Fear of current or ex partner: Not on file    Emotionally abused: Not on file    Physically abused: Not on file    Forced sexual activity: Not on file  Other Topics Concern  . Not on file  Social History Narrative  . Not on file    Review of Systems Per HPI.     Objective:   Physical Exam Vitals signs reviewed.  Constitutional:      General: She is not in acute distress.    Appearance: She is well-developed.  HENT:     Head: Normocephalic and atraumatic.     Right Ear: Hearing, tympanic membrane, ear canal and external ear normal.     Left Ear: Hearing, tympanic membrane, ear canal and external ear normal.     Nose: Nose normal.     Mouth/Throat:     Pharynx: No oropharyngeal exudate.  Eyes:     Conjunctiva/sclera: Conjunctivae normal.     Pupils: Pupils are equal, round, and reactive to light.    Cardiovascular:     Rate and Rhythm: Normal rate and regular rhythm.     Heart sounds: Normal heart sounds. No murmur.  Pulmonary:     Effort: Pulmonary effort is normal. No respiratory distress.     Breath sounds: Normal breath sounds. No wheezing or rhonchi.  Skin:    General: Skin is warm and dry.     Findings: No rash.  Neurological:     Mental Status: She is alert and oriented to person, place, and time.  Psychiatric:        Behavior: Behavior normal.    Vitals:  07/01/18 0824  BP: 131/78  Pulse: 71  Resp: 14  Temp: 98.4 F (36.9 C)  TempSrc: Oral  SpO2: 96%  Weight: 169 lb 12.8 oz (77 kg)  Height: 5\' 2"  (1.575 m)       Assessment & Plan:    Theresa Frazier is a 63 y.o. female Sinusitis, unspecified chronicity, unspecified location  Acute upper respiratory infection  -Likely initial viral symptoms now with persistent waxing waning symptoms, localizing to sinuses/head with discolored discharge now.  Suspicious for early sinusitis.  Start Augmentin, potential side effects of meds discussed, symptomatic care discussed, RTC precautions given.  Meds ordered this encounter  Medications  . amoxicillin-clavulanate (AUGMENTIN) 875-125 MG tablet    Sig: Take 1 tablet by mouth 2 (two) times daily.    Dispense:  20 tablet    Refill:  0   Patient Instructions   I suspect you do have a component of early sinus infection based on symptoms.  Continue Neti pot or saline nasal spray throughout the day, make sure to drink plenty of fluids.  Start Augmentin 1 pill twice per day.  See other information below.  Thank you for coming in today.   Sinusitis, Adult Sinusitis is inflammation of your sinuses. Sinuses are hollow spaces in the bones around your face. Your sinuses are located:  Around your eyes.  In the middle of your forehead.  Behind your nose.  In your cheekbones. Mucus normally drains out of your sinuses. When your nasal tissues become inflamed or swollen,  mucus can become trapped or blocked. This allows bacteria, viruses, and fungi to grow, which leads to infection. Most infections of the sinuses are caused by a virus. Sinusitis can develop quickly. It can last for up to 4 weeks (acute) or for more than 12 weeks (chronic). Sinusitis often develops after a cold. What are the causes? This condition is caused by anything that creates swelling in the sinuses or stops mucus from draining. This includes:  Allergies.  Asthma.  Infection from bacteria or viruses.  Deformities or blockages in your nose or sinuses.  Abnormal growths in the nose (nasal polyps).  Pollutants, such as chemicals or irritants in the air.  Infection from fungi (rare). What increases the risk? You are more likely to develop this condition if you:  Have a weak body defense system (immune system).  Do a lot of swimming or diving.  Overuse nasal sprays.  Smoke. What are the signs or symptoms? The main symptoms of this condition are pain and a feeling of pressure around the affected sinuses. Other symptoms include:  Stuffy nose or congestion.  Thick drainage from your nose.  Swelling and warmth over the affected sinuses.  Headache.  Upper toothache.  A cough that may get worse at night.  Extra mucus that collects in the throat or the back of the nose (postnasal drip).  Decreased sense of smell and taste.  Fatigue.  A fever.  Sore throat.  Bad breath. How is this diagnosed? This condition is diagnosed based on:  Your symptoms.  Your medical history.  A physical exam.  Tests to find out if your condition is acute or chronic. This may include: ? Checking your nose for nasal polyps. ? Viewing your sinuses using a device that has a light (endoscope). ? Testing for allergies or bacteria. ? Imaging tests, such as an MRI or CT scan. In rare cases, a bone biopsy may be done to rule out more serious types of fungal sinus  disease. How is this  treated? Treatment for sinusitis depends on the cause and whether your condition is chronic or acute.  If caused by a virus, your symptoms should go away on their own within 10 days. You may be given medicines to relieve symptoms. They include: ? Medicines that shrink swollen nasal passages (topical intranasal decongestants). ? Medicines that treat allergies (antihistamines). ? A spray that eases inflammation of the nostrils (topical intranasal corticosteroids). ? Rinses that help get rid of thick mucus in your nose (nasal saline washes).  If caused by bacteria, your health care provider may recommend waiting to see if your symptoms improve. Most bacterial infections will get better without antibiotic medicine. You may be given antibiotics if you have: ? A severe infection. ? A weak immune system.  If caused by narrow nasal passages or nasal polyps, you may need to have surgery. Follow these instructions at home: Medicines  Take, use, or apply over-the-counter and prescription medicines only as told by your health care provider. These may include nasal sprays.  If you were prescribed an antibiotic medicine, take it as told by your health care provider. Do not stop taking the antibiotic even if you start to feel better. Hydrate and humidify   Drink enough fluid to keep your urine pale yellow. Staying hydrated will help to thin your mucus.  Use a cool mist humidifier to keep the humidity level in your home above 50%.  Inhale steam for 10-15 minutes, 3-4 times a day, or as told by your health care provider. You can do this in the bathroom while a hot shower is running.  Limit your exposure to cool or dry air. Rest  Rest as much as possible.  Sleep with your head raised (elevated).  Make sure you get enough sleep each night. General instructions   Apply a warm, moist washcloth to your face 3-4 times a day or as told by your health care provider. This will help with  discomfort.  Wash your hands often with soap and water to reduce your exposure to germs. If soap and water are not available, use hand sanitizer.  Do not smoke. Avoid being around people who are smoking (secondhand smoke).  Keep all follow-up visits as told by your health care provider. This is important. Contact a health care provider if:  You have a fever.  Your symptoms get worse.  Your symptoms do not improve within 10 days. Get help right away if:  You have a severe headache.  You have persistent vomiting.  You have severe pain or swelling around your face or eyes.  You have vision problems.  You develop confusion.  Your neck is stiff.  You have trouble breathing. Summary  Sinusitis is soreness and inflammation of your sinuses. Sinuses are hollow spaces in the bones around your face.  This condition is caused by nasal tissues that become inflamed or swollen. The swelling traps or blocks the flow of mucus. This allows bacteria, viruses, and fungi to grow, which leads to infection.  If you were prescribed an antibiotic medicine, take it as told by your health care provider. Do not stop taking the antibiotic even if you start to feel better.  Keep all follow-up visits as told by your health care provider. This is important. This information is not intended to replace advice given to you by your health care provider. Make sure you discuss any questions you have with your health care provider. Document Released: 05/21/2005 Document Revised: 10/21/2017 Document  Reviewed: 10/21/2017 Elsevier Interactive Patient Education  [employer].   If you have lab work done today you will be contacted with your lab results within the next 2 weeks.  If you have not heard from [place] then please contact us. The fastest way to get your results is to register for My Chart.   IF you received an x-ray today, you will receive an invoice from [facility]. Please contact [facility] at [phone] with questions or concerns regarding your invoice.   IF you received labwork today, you will receive an invoice from [facility]. Please contact [facility] at [phone] with questions or concerns regarding your invoice.   Our billing staff will not be able to assist you with questions regarding bills from these companies.  You will be contacted with the lab results as soon as they are available. The fastest way to get your results is to activate your My Chart account. Instructions are located on the last page of this paperwork. If you have not heard from [place] regarding the results in 2 weeks, please contact this office.      Signed,   Merri Ray, MD [facility] [place].  07/01/18 8:50 AM

## 2018-07-17 ENCOUNTER — Ambulatory Visit (INDEPENDENT_AMBULATORY_CARE_PROVIDER_SITE_OTHER): Payer: BLUE CROSS/BLUE SHIELD

## 2018-07-17 ENCOUNTER — Ambulatory Visit (HOSPITAL_COMMUNITY)
Admission: EM | Admit: 2018-07-17 | Discharge: 2018-07-17 | Disposition: A | Discharge status: Home / Self Care | Payer: BLUE CROSS/BLUE SHIELD | Attending: Family Medicine | Admitting: Family Medicine

## 2018-07-17 ENCOUNTER — Encounter (HOSPITAL_COMMUNITY): Payer: Self-pay

## 2018-07-17 DIAGNOSIS — J111 Influenza due to unidentified influenza virus with other respiratory manifestations: Secondary | ICD-10-CM

## 2018-07-17 DIAGNOSIS — R69 Illness, unspecified: Secondary | ICD-10-CM | POA: Diagnosis not present

## 2018-07-17 DIAGNOSIS — R05 Cough: Secondary | ICD-10-CM | POA: Diagnosis not present

## 2018-07-17 MED ORDER — OSELTAMIVIR PHOSPHATE 75 MG PO CAPS: 75.0000 mg | ORAL_CAPSULE | Freq: Two times a day (BID) | ORAL | 0 refills | Status: DC

## 2018-07-17 NOTE — ED Provider Notes (Signed)
[facility]    CSN: [id number] Arrival date & time: 07/17/18  1330     History   Chief Complaint Chief Complaint  Patient presents with  . Generalized Body Aches  . Fever  . Cough    HPI Theresa Frazier is a 63 y.o. female.   63 year old female presents today with body aches, fever, cough, congestion for 1 day.  Reports sudden onset yesterday.  She did take over-the-counter medication with relief of fever.  She has also been sick with URI symptoms for approximately 1 week prior to this starting.  She is having some fatigue.  Denies any recent sick contacts.  Denies any recent traveling.  She does not smoke.  Denies any history of asthma.  ROS per HPI    Fever  Associated symptoms: cough   Cough  Associated symptoms: fever     Past Medical History:  Diagnosis Date  . 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 Dyslipidemia   . Family history of early CAD   . Genetic testing 09/28/2016   Genetic testing revealed ONE pathogenic mutation in [place]. Though a pathogenic mutation was identified, increased risk for cancer is only associated with TWO (biallelic) pathogenic mutations. No additional mutations or variants of uncertain significance were identified. Testing was performed through [facility]'s 46-gene Common Hereditary Cancers Panel. [facility]'s Common Hereditary Cancers Panel includ  . GERD (gastroesophageal reflux disease)     Patient Active Problem List   Diagnosis Date Noted  . Sleep disturbance 05/06/2018  . 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 Rectocele 11/19/2016  . Nodule of apex of right lung 11/19/2016  . Genetic testing 09/28/2016  . Ovarian cancer on right ([place]) 05/31/2016  . Ovarian cancer ([place]) 05/31/2016  . Pelvic mass 05/14/2016  . Hyperlipidemia 11/07/2012  . Fatigue 11/07/2012  . Family history of early CAD 11/07/2012    Past Surgical History:  Procedure Laterality Date  . ABDOMINAL  HYSTERECTOMY Left 05/31/2016   Procedure: HYSTERECTOMY ABDOMINAL;  Surgeon: Everitt Amber, MD;  [facility]: [facility];  Service: Gynecology;  Laterality: Left;  . BREAST SURGERY  1993   left breast-calcification  . COLONOSCOPY    . LAPAROTOMY N/A 05/31/2016   Procedure: EXPLORATORY LAPAROTOMY;  Surgeon: Everitt Amber, MD;  [facility];  Service: Gynecology;  Laterality: N/A;  . OMENTECTOMY  05/31/2016   Procedure: OMENTECTOMY AND STAGING FOR OVARIAN CANCER;  Surgeon: Everitt Amber, MD;  Location: [facility];  Service: Gynecology;;  . SALPINGOOPHORECTOMY Right 05/31/2016   Procedure: BILATERAL SALPINGO OOPHORECTOMY;  Surgeon: Everitt Amber, MD;  [facility];  Service: Gynecology;  Laterality: Right;  . TONSILLECTOMY      OB History   No obstetric history on file.      Home Medications    Prior to Admission medications   Medication Sig Start Date End Date Taking? Authorizing Provider  amoxicillin-clavulanate (AUGMENTIN) 875-125 MG tablet Take 1 tablet by mouth 2 (two) times daily. 07/01/18   Wendie Agreste, MD  APPLE CIDER VINEGAR PO Take 2 mLs by mouth.    [provider]  Biotin 10 MG CAPS Take by mouth.    [provider]  Black Currant Seed Oil 500 MG CAPS Take by mouth.    [provider]  conjugated estrogens (PREMARIN) vaginal cream Place 1 Applicatorful vaginally 3 (three) times a week. 11/19/16   Cross, Lenna Sciara D, NP  FLORA-Q (FLORA-Q) CAPS capsule Take 1 capsule by mouth  daily.    [provider]  hydroxypropyl methylcellulose / hypromellose (ISOPTO TEARS / GONIOVISC) 2.5 % ophthalmic solution Place 1-2 drops into both eyes 3 (three) times daily as needed for dry eyes.    [provider]  Multiple Minerals (JOINT HEALTH MINERAL PO) Take by mouth.    [provider]  Multiple Vitamin (MULTIVITAMIN) tablet Take 1 tablet by mouth daily.    [provider]  NON FORMULARY Patient uses essential oils as needed    [provider]  oseltamivir (TAMIFLU) 75 MG capsule Take 1 capsule (75 mg total) by mouth every 12 (twelve) hours. 07/17/18   Orvan July, NP    Family History Family History  Problem Relation Age of Onset  . Pancreatic cancer Mother 79       d.73 - two months after diagnosis  . Addison's disease Maternal Aunt 76  . Leukemia Maternal Uncle 56       d.59 - shortly after diagnosis  . Melanoma Daughter 81    Social History Social History   Tobacco Use  . Smoking status: Former Smoker    Years: 7.00    Types: Cigarettes    Last attempt to quit: 11/07/1985    Years since quitting: 32.7  . Smokeless tobacco: Never Used  Substance Use Topics  . Alcohol use: No  . Drug use: No     Allergies   Patient has no known allergies.   Review of Systems Review of Systems  Constitutional: Positive for fever.  Respiratory: Positive for cough.      Physical Exam Triage Vital Signs ED Triage Vitals [07/17/18 1413]  Enc Vitals Group     BP 120/65     Pulse Rate 80     Resp 16     Temp 99 F (37.2 C)     Temp Source Oral     SpO2 98 %     Weight      Height      Head Circumference      Peak Flow      Pain Score 0     Pain Loc      Pain Edu?      Excl. in [place]?    No data found.  Updated Vital Signs BP 120/65 (BP Location: Right Arm)   Pulse 80   Temp 99 F (37.2 C) (Oral)   Resp 16   SpO2 98%   Visual Acuity Right Eye Distance:   Left Eye Distance:   Bilateral Distance:    Right Eye Near:   Left Eye Near:    Bilateral Near:     Physical Exam Vitals signs and nursing note reviewed.  Constitutional:      General: She is not in acute distress.    Appearance: Normal appearance. She is well-developed. She is not toxic-appearing or diaphoretic.  HENT:     Head: Normocephalic and atraumatic.     Right Ear: Tympanic membrane and ear canal normal.     Left Ear: Tympanic membrane and ear canal normal.     Nose: Congestion present.     Mouth/Throat:     Pharynx:  Oropharynx is clear.  Eyes:     Conjunctiva/sclera: Conjunctivae normal.  Neck:     Musculoskeletal: Normal range of motion and neck supple.  Cardiovascular:     Rate and Rhythm: Normal rate and regular rhythm.     Heart sounds: No murmur.  Pulmonary:     Effort: Pulmonary effort  is normal.     Comments: Decreased lung sounds in bases Musculoskeletal: Normal range of motion.  Skin:    General: Skin is warm and dry.     Findings: No rash.  Neurological:     Mental Status: She is alert.  Psychiatric:        Mood and Affect: Mood normal.      UC Treatments / Results  Labs (all labs ordered are listed, but only abnormal results are displayed) Labs Reviewed - No data to display  EKG None  Radiology Dg Chest 2 View  Result Date: 07/17/2018 CLINICAL DATA:  Cough, fever. EXAM: CHEST - 2 VIEW COMPARISON:  Radiographs of November 19, 2016. FINDINGS: The heart size and mediastinal contours are within normal limits. Both lungs are clear. The visualized skeletal structures are unremarkable. IMPRESSION: No active cardiopulmonary disease. Electronically Signed   By: Marijo Conception, M.D.   On: 07/17/2018 15:12    Procedures Procedures (including critical care time)  Medications Ordered in UC Medications - No data to display  Initial Impression / Assessment and Plan / UC Course  I have reviewed the triage vital signs and the nursing notes.  Pertinent labs & imaging results that were available during my care of the patient were reviewed by me and considered in my medical decision making (see chart for details).     Chest x-ray was normal This is most likely a flulike illness Patient within 24 hours of sudden onset of fever and acute symptoms. We will go ahead and treat with Tamiflu twice a day for 5 days Tylenol/ibuprofen for fever and body aches Mucinex for cough, congestion into the mucus Follow up as needed for continued or worsening symptoms  Final Clinical Impressions(s) /  UC Diagnoses   Final diagnoses:  Influenza-like illness     Discharge Instructions     Your chest x-ray was normal We will treat you for a flulike illness with Tamiflu twice a day for 5 days Tylenol/ibuprofen for body aches and fever Follow up as needed for continued or worsening symptoms     ED Prescriptions    Medication Sig Dispense Auth. Provider   oseltamivir (TAMIFLU) 75 MG capsule Take 1 capsule (75 mg total) by mouth every 12 (twelve) hours. 10 capsule Loura Halt A, NP     Controlled Substance Prescriptions [place] Controlled Substance Registry consulted? Not Applicable   Orvan July, NP 07/17/18 1625

## 2018-07-17 NOTE — ED Triage Notes (Signed)
Pt present body aches,fever, and coughing that started two days ago. Pt has tried OTC medication with some relief for her fever.

## 2018-07-17 NOTE — Discharge Instructions (Addendum)
Your chest x-ray was normal We will treat you for a flulike illness with Tamiflu twice a day for 5 days Tylenol/ibuprofen for body aches and fever Follow up as needed for continued or worsening symptoms

## 2018-08-06 ENCOUNTER — Telehealth: Payer: Self-pay | Admitting: Family Medicine

## 2018-08-06 NOTE — Telephone Encounter (Signed)
Copied from [place] [id number]. Topic: General - Other >> Aug 06, 2018 11:19 AM Lennox Solders wrote: Reason for CRM: pt is concern about corona virus and would like to know if she should get PNA vaccine. Pt is 63 years old

## 2018-08-06 NOTE — Telephone Encounter (Signed)
Called and informed pt that it would be a great idea to receive the Pneu.[email or web address] vaccine. She verbalized understanding.

## 2018-08-07 ENCOUNTER — Ambulatory Visit: Payer: BLUE CROSS/BLUE SHIELD

## 2018-11-28 ENCOUNTER — Encounter: Payer: Self-pay | Admitting: Gynecologic Oncology

## 2018-11-28 ENCOUNTER — Other Ambulatory Visit: Payer: Self-pay

## 2018-11-28 ENCOUNTER — Inpatient Hospital Stay: Payer: BC Managed Care – PPO

## 2018-11-28 ENCOUNTER — Inpatient Hospital Stay: Payer: BC Managed Care – PPO | Attending: Gynecologic Oncology | Admitting: Gynecologic Oncology

## 2018-11-28 VITALS — BP 143/75 | HR 71 | Temp 97.8°F | Resp 17 | Ht 62.0 in | Wt 166.4 lb

## 2018-11-28 DIAGNOSIS — C561 Malignant neoplasm of right ovary: Secondary | ICD-10-CM

## 2018-11-28 DIAGNOSIS — K439 Ventral hernia without obstruction or gangrene: Secondary | ICD-10-CM

## 2018-11-28 DIAGNOSIS — Z90722 Acquired absence of ovaries, bilateral: Secondary | ICD-10-CM | POA: Diagnosis not present

## 2018-11-28 DIAGNOSIS — Z90710 Acquired absence of both cervix and uterus: Secondary | ICD-10-CM | POA: Insufficient documentation

## 2018-11-28 DIAGNOSIS — N186 End stage renal disease: Secondary | ICD-10-CM | POA: Diagnosis not present

## 2018-11-28 DIAGNOSIS — N816 Rectocele: Secondary | ICD-10-CM | POA: Diagnosis not present

## 2018-11-28 LAB — CEA (IN HOUSE-CHCC): CEA (CHCC-In House): 1.34 ng/mL (ref 0.00–5.00)

## 2018-11-28 NOTE — Progress Notes (Signed)
Follow-up Note: Gyn-Onc  Consult was requested by Dr. Marisue Humble and Dr Laurann Montana for the evaluation of Theresa Frazier 63 y.o. female  CC:  Chief Complaint  Patient presents with  . Ovarian Cancer    follow-up    Assessment/Plan:  Ms. ANALEISE MCCLEERY  is a 63 y.o.  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 No measurable disease.   I discussed the risk for recurrence associated with this disease. I discussed symptoms consistent with recurrence and for her to notify me of these should they occur.  I recommend 6 monthly evaluations with the next in June, 2020 with CEA and CA 125.  Rectocele and vaginal dryness - treated with premarin. S/p consult with Dr Zigmund Daniel.   Ventral hernia - seeing Dr Grandville Silos for this.    HPI: Theresa Frazier is a 63 year old parous (P2) woman who is seen in consultation at the request of Dr Marisue Humble and Dr Laurann Montana for a complex cystic and solid pelvic mass.  The patient reported to her PCP's that she had abdominal bloating and fullness for 6-8 weeks since October 2017 when she saw them on 04/25/16. They performed a pelvic examination on that visit and appreciated a pelvic mass. A TVUS was ordered and performed on 05/04/16 and revealed a uterus measuring 8.4x3.7x4.8cm with a 7cm endometrial stripe (she has had no bleeding symptoms) and the ovaries were not discretely seen. Instead, there is a large complex cystic and solid mass at midline, extending into the right and left adnexum with internal blood flow. The mass measures 11 x 7.4 x 14.1 cm. Separate ovaries are not identified.  A CT scan had been ordered but canceled upon the US findings and plan for consultation with me.  She is feeling fairly well. Her pants fit differently lately and she feels she may have lost weight. Her bladder and bowels are moving normally.  She denies pain.  She has no family history for  breast/ovarian cancer or any other significant cancer history.  Her surgical history is significant for tonsillectomy only (no abdominal surgery).  CT scan on 05/16/16 showed: large (15cm) complex mass in the central anatomic pelvis appears to arise from the right ovary and is highly worrisome for a cystic ovarian carcinoma and a small amount of pelvic free fluid. CA 125 on 05/14/16 was elevated at 72.  On 05/31/16 she went to the OR for an exploratory laparotomy, TAH, BSO, omentectomy and peritoneal biopsies (no lymphadenectomy due to frozen section showing mucinous cell type).Intraoperative findings were significant for a 15cm right ovarian mass non-adherent, and otherwise normal peritoneal cavity and appendix. Frozen showed mucinous adencarcinoma.  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  Interval Hx:  CA 125 on 11/15/16 was normal at 11.9 CEA on 11/15/16 was normal at <1  CA 125 on 02/18/17 was normal at 14 CEA on 02/18/17 was normal at 1  CA 125 on 05/24/17 was normal at 11.6 CEA on 05/24/17 was normal at <1  CA 125 on 07/29/17 was normal at 13.3 CEA on 07/29/17 was normal at <1  CA 125 on 11/07/17 was normal at 12.4 CEA on 11/07/17 was normal at <1.   CT abd/pelvis and chest on 08/16/17 showed no evidence of recurrence and stable pulm and peritoneal nodules.   CA 125 on 02/24/18 was normal at 10 CEA  on 02/24/18 was normal at <1.   CA 125 on 05/26/18 was normal at 10 CEA on 05/26/18 was normal at 1.31  She has had some symptoms of intermittent diverticulitis. Today we discussed survivorship issues including diet and exercise, emotional wellness and spirituality, sexual function, surveillance schedule.  Current Meds:  Outpatient Encounter Medications as of 11/28/2018  Medication Sig  . APPLE CIDER VINEGAR PO Take 2 mLs by mouth.  . Biotin 10 MG CAPS Take by mouth.  . Black Currant  Seed Oil 500 MG CAPS Take by mouth.  . conjugated estrogens (PREMARIN) vaginal cream Place 1 Applicatorful vaginally 3 (three) times a week.  . hydroxypropyl methylcellulose / hypromellose (ISOPTO TEARS / GONIOVISC) 2.5 % ophthalmic solution Place 1-2 drops into both eyes 3 (three) times daily as needed for dry eyes.  . Multiple Vitamin (MULTIVITAMIN) tablet Take 1 tablet by mouth daily.  . NON FORMULARY Patient uses essential oils as needed  . [DISCONTINUED] amoxicillin-clavulanate (AUGMENTIN) 875-125 MG tablet Take 1 tablet by mouth 2 (two) times daily.  . [DISCONTINUED] FLORA-Q (FLORA-Q) CAPS capsule Take 1 capsule by mouth daily.  . [DISCONTINUED] Multiple Minerals (JOINT HEALTH MINERAL PO) Take by mouth.  . [DISCONTINUED] oseltamivir (TAMIFLU) 75 MG capsule Take 1 capsule (75 mg total) by mouth every 12 (twelve) hours.   No facility-administered encounter medications on file as of 11/28/2018.     Allergy: No Known Allergies  Social Hx:   Social History   Socioeconomic History  . Marital status: Married    Spouse name: Not on file  . Number of children: Not on file  . Years of education: Not on file  . Highest education level: Not on file  Occupational History  . Not on file  Social Needs  . Financial resource strain: Not on file  . Food insecurity    Worry: Not on file    Inability: Not on file  . Transportation needs    Medical: Not on file    Non-medical: Not on file  Tobacco Use  . Smoking status: Former Smoker    Years: 7.00    Types: Cigarettes    Quit date: 11/07/1985    Years since quitting: 33.0  . Smokeless tobacco: Never Used  Substance and Sexual Activity  . Alcohol use: No  . Drug use: No  . Sexual activity: Not on file  Lifestyle  . Physical activity    Days per week: Not on file    Minutes per session: Not on file  . Stress: Not on file  Relationships  . Social Herbalist on phone: Not on file    Gets together: Not on file    Attends  religious service: Not on file    Active member of club or organization: Not on file    Attends meetings of clubs or organizations: Not on file    Relationship status: Not on file  . Intimate partner violence    Fear of current or ex partner: Not on file    Emotionally abused: Not on file    Physically abused: Not on file    Forced sexual activity: Not on file  Other Topics Concern  . Not on file  Social History Narrative  . Not on file    Past Surgical Hx:  Past Surgical History:  Procedure Laterality Date  . ABDOMINAL HYSTERECTOMY Left 05/31/2016   Procedure: HYSTERECTOMY ABDOMINAL;  Surgeon: Everitt Amber, MD;  Location: [facility];  Service: Gynecology;  Laterality:  Left;  . BREAST SURGERY  1993   left breast-calcification  . COLONOSCOPY    . LAPAROTOMY N/A 05/31/2016   Procedure: EXPLORATORY LAPAROTOMY;  Surgeon: Everitt Amber, MD;  Location: [facility];  Service: Gynecology;  Laterality: N/A;  . OMENTECTOMY  05/31/2016   Procedure: OMENTECTOMY AND STAGING FOR OVARIAN CANCER;  Surgeon: Everitt Amber, MD;  Location: [facility];  Service: Gynecology;;  . SALPINGOOPHORECTOMY Right 05/31/2016   Procedure: BILATERAL SALPINGO OOPHORECTOMY;  Surgeon: Everitt Amber, MD;  Location: [facility];  Service: Gynecology;  Laterality: Right;  . TONSILLECTOMY      Past Medical Hx:  Past Medical History:  Diagnosis Date  . 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 Dyslipidemia   . Family history of early CAD   . Genetic testing 09/28/2016   Genetic testing revealed ONE pathogenic mutation in [place]. Though a pathogenic mutation was identified, increased risk for cancer is only associated with TWO (biallelic) pathogenic mutations. No additional mutations or variants of uncertain significance were identified. Testing was performed through [facility]'s 46-gene Common Hereditary Cancers Panel. [facility]'s Common Hereditary Cancers Panel includ  . GERD (gastroesophageal reflux disease)     Past  Gynecological History:  SVD x 2 No LMP recorded. Patient has had a hysterectomy.  Family Hx:  Family History  Problem Relation Age of Onset  . Pancreatic cancer Mother 54       d.73 - two months after diagnosis  . Addison's disease Maternal Aunt 76  . Leukemia Maternal Uncle 52       d.59 - shortly after diagnosis  . Melanoma Daughter 3    Review of Systems:  Constitutional  Feels well,    ENT Normal appearing ears and nares bilaterally Skin/Breast  No rash, sores, jaundice, itching, dryness Cardiovascular  No chest pain, shortness of breath, or edema  Pulmonary  No cough or wheeze.  Gastro Intestinal  No nausea, vomitting, or diarrhoea. No bright red blood per rectum, no abdominal pain, change in bowel movement, or constipation.  Genito Urinary  No frequency, urgency, dysuria, no bleeding Musculo Skeletal  No myalgia, arthralgia, joint swelling or pain  Neurologic  No weakness, numbness, change in gait,  Psychology  No depression, anxiety, insomnia.   Vitals:  Blood pressure (!) 143/75, pulse 71, temperature 97.8 F (36.6 C), temperature source Oral, resp. rate 17, height 5\' 2"  (1.575 m), weight 166 lb 6.4 oz (75.5 kg), SpO2 100 %.  Physical Exam: WD in NAD Neck  Supple NROM, without any enlargements.  Lymph Node Survey No cervical supraclavicular or inguinal adenopathy Cardiovascular  Pulse normal rate, regularity and rhythm. S1 and S2 normal.  Lungs  Clear to auscultation bilateraly, without wheezes/crackles/rhonchi. Good air movement.  Skin  No rash/lesions/breakdown  Psychiatry  Alert and oriented to person, place, and time  Abdomen  Normoactive bowel sounds, abdomen soft, non-tender and nonobese. Right paramedian incisional hernia - no incarceration or tenderness.  Back No CVA tenderness Genito Urinary: vaginal cuff in tact, no lesions. No masses. She has posterior vaginal wall prolapse (rectocele). Rectal  deferred  Extremities  No bilateral  cyanosis, clubbing or edema.   Thereasa Solo, MD  11/28/2018, 2:21 PM

## 2018-11-28 NOTE — Patient Instructions (Signed)
Please notify Dr Denman George at phone number [phone] if you notice vaginal bleeding, new pelvic or abdominal pains, bloating, feeling full easy, or a change in bladder or bowel function.   Dr Serita Grit office will call you with your lab results next week as soon as available.  Please contact Dr Serita Grit office (at [phone]) in or after August, 2020 to request an appointment with her for December, 2020.

## 2018-11-29 LAB — CA 125: Cancer Antigen (CA) 125: 9.7 U/mL (ref 0.0–38.1)

## 2018-12-01 ENCOUNTER — Telehealth: Payer: Self-pay

## 2018-12-01 NOTE — Telephone Encounter (Signed)
Told Ms Zawistowski that her CA-125 and CEA were both stable and WNL. Gave patient the values.

## 2018-12-23 DIAGNOSIS — Z808 Family history of malignant neoplasm of other organs or systems: Secondary | ICD-10-CM | POA: Diagnosis not present

## 2018-12-23 DIAGNOSIS — L814 Other melanin hyperpigmentation: Secondary | ICD-10-CM | POA: Diagnosis not present

## 2018-12-23 DIAGNOSIS — L821 Other seborrheic keratosis: Secondary | ICD-10-CM | POA: Diagnosis not present

## 2018-12-23 DIAGNOSIS — D225 Melanocytic nevi of trunk: Secondary | ICD-10-CM | POA: Diagnosis not present

## 2019-02-21 ENCOUNTER — Encounter (HOSPITAL_COMMUNITY): Payer: Self-pay | Admitting: Emergency Medicine

## 2019-02-21 ENCOUNTER — Other Ambulatory Visit: Payer: Self-pay

## 2019-02-21 ENCOUNTER — Ambulatory Visit (HOSPITAL_COMMUNITY)
Admission: EM | Admit: 2019-02-21 | Discharge: 2019-02-21 | Disposition: A | Discharge status: Home / Self Care | Payer: BC Managed Care – PPO | Attending: Family Medicine | Admitting: Family Medicine

## 2019-02-21 DIAGNOSIS — J329 Chronic sinusitis, unspecified: Secondary | ICD-10-CM

## 2019-02-21 MED ORDER — FLUTICASONE PROPIONATE 50 MCG/ACT NA SUSP: 1.0000 | Freq: Every day | NASAL | 2 refills | Status: DC

## 2019-02-21 MED ORDER — AMOXICILLIN-POT CLAVULANATE 875-125 MG PO TABS: 1.0000 | ORAL_TABLET | Freq: Two times a day (BID) | ORAL | 0 refills | Status: DC

## 2019-02-21 NOTE — ED Provider Notes (Signed)
[place]    CSN: [id number] Arrival date & time: 02/21/19  1357      History   Chief Complaint Chief Complaint  Patient presents with  . Appointment    210  . head congestion    HPI Theresa Frazier is a 63 y.o. female.   Patient is a 63 year old female that presents today with approximately 1 week of sinus congestion, sinus pressure, rhinorrhea, dizziness. Mild decrease in appetite. Symptoms have been constant, waxing and waning.  She took Sudafed with some relief of her symptoms.  Was concerned because the symptoms have not improved.  No associated sore throat,cough, chest congestion, fever. No recent sick contact or COVID exposures. Denies hx of allergies.   ROS per HPI      Past Medical History:  Diagnosis Date  . 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 Dyslipidemia   . Family history of early CAD   . Genetic testing 09/28/2016   Genetic testing revealed ONE pathogenic mutation in [place]. Though a pathogenic mutation was identified, increased risk for cancer is only associated with TWO (biallelic) pathogenic mutations. No additional mutations or variants of uncertain significance were identified. Testing was performed through [facility]'s 46-gene Common Hereditary Cancers Panel. [facility]'s Common Hereditary Cancers Panel includ  . GERD (gastroesophageal reflux disease)     Patient Active Problem List   Diagnosis Date Noted  . Sleep disturbance 05/06/2018  . 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 Rectocele 11/19/2016  . Nodule of apex of right lung 11/19/2016  . Genetic testing 09/28/2016  . Ovarian cancer on right ([place]) 05/31/2016  . Ovarian cancer ([place]) 05/31/2016  . Pelvic mass 05/14/2016  . Hyperlipidemia 11/07/2012  . Fatigue 11/07/2012  . Family history of early CAD 11/07/2012    Past Surgical History:  Procedure Laterality Date  . ABDOMINAL HYSTERECTOMY Left 05/31/2016   Procedure: HYSTERECTOMY  ABDOMINAL;  Surgeon: Everitt Amber, MD;  Location: [facility];  Service: Gynecology;  Laterality: Left;  . BREAST SURGERY  1993   left breast-calcification  . COLONOSCOPY    . LAPAROTOMY N/A 05/31/2016   Procedure: EXPLORATORY LAPAROTOMY;  Surgeon: Everitt Amber, MD;  Location: [facility];  Service: Gynecology;  Laterality: N/A;  . OMENTECTOMY  05/31/2016   Procedure: OMENTECTOMY AND STAGING FOR OVARIAN CANCER;  Surgeon: Everitt Amber, MD;  Location: [facility];  Service: Gynecology;;  . SALPINGOOPHORECTOMY Right 05/31/2016   Procedure: BILATERAL SALPINGO OOPHORECTOMY;  Surgeon: Everitt Amber, MD;  Location: [facility];  Service: Gynecology;  Laterality: Right;  . TONSILLECTOMY      OB History   No obstetric history on file.      Home Medications    Prior to Admission medications   Medication Sig Start Date End Date Taking? Authorizing Provider  amoxicillin-clavulanate (AUGMENTIN) 875-125 MG tablet Take 1 tablet by mouth every 12 (twelve) hours. 02/21/19   Mychele Seyller, Tressia Miners A, NP  APPLE CIDER VINEGAR PO Take 2 mLs by mouth.    [provider]  Biotin 10 MG CAPS Take by mouth.    [provider]  Black Currant Seed Oil 500 MG CAPS Take by mouth.    [provider]  conjugated estrogens (PREMARIN) vaginal cream Place 1 Applicatorful vaginally 3 (three) times a week. 11/19/16   Cross, Lenna Sciara D, NP  fluticasone (FLONASE) 50 MCG/ACT nasal spray Place 1 spray into both nostrils daily. 02/21/19   Orvan July, NP  hydroxypropyl methylcellulose / hypromellose (ISOPTO  TEARS / GONIOVISC) 2.5 % ophthalmic solution Place 1-2 drops into both eyes 3 (three) times daily as needed for dry eyes.    [provider]  Multiple Vitamin (MULTIVITAMIN) tablet Take 1 tablet by mouth daily.    [provider]  NON FORMULARY Patient uses essential oils as needed    [provider]    Family History Family History  Problem Relation Age of Onset  . Pancreatic cancer Mother 64       d.73  - two months after diagnosis  . Addison's disease Maternal Aunt 76  . Leukemia Maternal Uncle 47       d.59 - shortly after diagnosis  . Melanoma Daughter 71    Social History Social History   Tobacco Use  . Smoking status: Former Smoker    Years: 7.00    Types: Cigarettes    Quit date: 11/07/1985    Years since quitting: 33.3  . Smokeless tobacco: Never Used  Substance Use Topics  . Alcohol use: No  . Drug use: No     Allergies   Patient has no known allergies.   Review of Systems Review of Systems   Physical Exam Triage Vital Signs ED Triage Vitals [02/21/19 1419]  Enc Vitals Group     BP 137/83     Pulse Rate 73     Resp 18     Temp 98.6 F (37 C)     Temp Source Oral     SpO2 97 %     Weight      Height      Head Circumference      Peak Flow      Pain Score 3     Pain Loc      Pain Edu?      Excl. in [place]?    No data found.  Updated Vital Signs BP 137/83 (BP Location: Right Arm)   Pulse 73   Temp 98.6 F (37 C) (Oral)   Resp 18   SpO2 97%   Visual Acuity Right Eye Distance:   Left Eye Distance:   Bilateral Distance:    Right Eye Near:   Left Eye Near:    Bilateral Near:     Physical Exam Vitals signs and nursing note reviewed.  Constitutional:      General: She is not in acute distress.    Appearance: Normal appearance. She is not ill-appearing, toxic-appearing or diaphoretic.  HENT:     Head: Normocephalic and atraumatic.     Right Ear: Tympanic membrane and ear canal normal.     Left Ear: Tympanic membrane and ear canal normal.     Nose: Congestion present.     Right Sinus: No maxillary sinus tenderness or frontal sinus tenderness.     Left Sinus: No maxillary sinus tenderness or frontal sinus tenderness.     Mouth/Throat:     Pharynx: Oropharynx is clear.  Eyes:     Conjunctiva/sclera: Conjunctivae normal.  Neck:     Musculoskeletal: Normal range of motion.  Cardiovascular:     Rate and Rhythm: Normal rate and regular rhythm.      Pulses: Normal pulses.     Heart sounds: Normal heart sounds.  Pulmonary:     Effort: Pulmonary effort is normal.     Breath sounds: Normal breath sounds.  Neurological:     Mental Status: She is alert.      UC Treatments / Results  Labs (all labs ordered are listed,  but only abnormal results are displayed) Labs Reviewed - No data to display  EKG   Radiology No results found.  Procedures Procedures (including critical care time)  Medications Ordered in UC Medications - No data to display  Initial Impression / Assessment and Plan / UC Course  I have reviewed the triage vital signs and the nursing notes.  Pertinent labs & imaging results that were available during my care of the patient were reviewed by me and considered in my medical decision making (see chart for details).     Sinusitis- treating with Flonase and if no improvement in symptoms she can start the abx on Monday. This will be 10 days total. Pt understanding and agreed.  Final Clinical Impressions(s) / UC Diagnoses   Final diagnoses:  Chronic sinusitis, unspecified location     Discharge Instructions     Try using the Flonase for the next couple of days.  If your symptoms do not improve or worsen by Monday you can go ahead and start the antibiotics for sinus infection. With the Flonase you can start with 2 sprays in each nostril once a day until you start seeing improvement in symptoms and then you can back off to 1 spray in each nostril daily     ED Prescriptions    Medication Sig Dispense Auth. Provider   fluticasone (FLONASE) 50 MCG/ACT nasal spray Place 1 spray into both nostrils daily. 16 g Daisy Mcneel A, NP   amoxicillin-clavulanate (AUGMENTIN) 875-125 MG tablet Take 1 tablet by mouth every 12 (twelve) hours. 14 tablet Kaylise Blakeley A, NP     PDMP not reviewed this encounter.   Loura Halt A, NP 02/21/19 1452

## 2019-02-21 NOTE — ED Triage Notes (Addendum)
Pt sts head congestion and scratchy throat x 1 week; denies fever or other sx; pt requests to speak with provider prior to any testing

## 2019-02-21 NOTE — Discharge Instructions (Addendum)
Try using the Flonase for the next couple of days.  If your symptoms do not improve or worsen by Monday you can go ahead and start the antibiotics for sinus infection. With the Flonase you can start with 2 sprays in each nostril once a day until you start seeing improvement in symptoms and then you can back off to 1 spray in each nostril daily

## 2019-02-27 DIAGNOSIS — H5203 Hypermetropia, bilateral: Secondary | ICD-10-CM | POA: Diagnosis not present

## 2019-02-27 DIAGNOSIS — H524 Presbyopia: Secondary | ICD-10-CM | POA: Diagnosis not present

## 2019-02-27 DIAGNOSIS — H2513 Age-related nuclear cataract, bilateral: Secondary | ICD-10-CM | POA: Diagnosis not present

## 2019-03-12 ENCOUNTER — Other Ambulatory Visit: Payer: Self-pay | Admitting: Family Medicine

## 2019-03-12 DIAGNOSIS — Z1231 Encounter for screening mammogram for malignant neoplasm of breast: Secondary | ICD-10-CM

## 2019-03-16 ENCOUNTER — Ambulatory Visit: Payer: BLUE CROSS/BLUE SHIELD

## 2019-03-17 ENCOUNTER — Ambulatory Visit (INDEPENDENT_AMBULATORY_CARE_PROVIDER_SITE_OTHER): Payer: BC Managed Care – PPO | Admitting: Family Medicine

## 2019-03-17 ENCOUNTER — Other Ambulatory Visit: Payer: Self-pay

## 2019-03-17 DIAGNOSIS — Z23 Encounter for immunization: Secondary | ICD-10-CM

## 2019-03-17 NOTE — Progress Notes (Signed)
Pt came under nursing appt and received regular flu shot.

## 2019-04-06 ENCOUNTER — Ambulatory Visit: Payer: BC Managed Care – PPO

## 2019-04-28 ENCOUNTER — Ambulatory Visit: Payer: BC Managed Care – PPO

## 2019-05-18 ENCOUNTER — Ambulatory Visit
Admission: RE | Admit: 2019-05-18 | Discharge: 2019-05-18 | Disposition: A | Discharge status: Home / Self Care | Payer: BC Managed Care – PPO | Source: Ambulatory Visit | Attending: Family Medicine | Admitting: Family Medicine

## 2019-05-18 ENCOUNTER — Other Ambulatory Visit: Payer: Self-pay

## 2019-05-18 DIAGNOSIS — Z1231 Encounter for screening mammogram for malignant neoplasm of breast: Secondary | ICD-10-CM

## 2019-05-19 ENCOUNTER — Inpatient Hospital Stay: Payer: BC Managed Care – PPO | Attending: Gynecologic Oncology

## 2019-05-19 ENCOUNTER — Other Ambulatory Visit: Payer: Self-pay

## 2019-05-19 DIAGNOSIS — K439 Ventral hernia without obstruction or gangrene: Secondary | ICD-10-CM | POA: Insufficient documentation

## 2019-05-19 DIAGNOSIS — N816 Rectocele: Secondary | ICD-10-CM | POA: Diagnosis not present

## 2019-05-19 DIAGNOSIS — Z87891 Personal history of nicotine dependence: Secondary | ICD-10-CM | POA: Insufficient documentation

## 2019-05-19 DIAGNOSIS — Z90710 Acquired absence of both cervix and uterus: Secondary | ICD-10-CM | POA: Insufficient documentation

## 2019-05-19 DIAGNOSIS — C561 Malignant neoplasm of right ovary: Secondary | ICD-10-CM

## 2019-05-19 DIAGNOSIS — Z8543 Personal history of malignant neoplasm of ovary: Secondary | ICD-10-CM | POA: Insufficient documentation

## 2019-05-19 LAB — CEA (IN HOUSE-CHCC): CEA (CHCC-In House): <1 ng/mL (ref 0.00–5.00)

## 2019-05-20 LAB — CA 125: Cancer Antigen (CA) 125: 9.3 U/mL (ref 0.0–38.1)

## 2019-05-21 ENCOUNTER — Encounter: Payer: Self-pay | Admitting: Gynecologic Oncology

## 2019-05-21 ENCOUNTER — Other Ambulatory Visit: Payer: Self-pay

## 2019-05-21 ENCOUNTER — Inpatient Hospital Stay (HOSPITAL_BASED_OUTPATIENT_CLINIC_OR_DEPARTMENT_OTHER): Payer: BC Managed Care – PPO | Admitting: Gynecologic Oncology

## 2019-05-21 VITALS — BP 142/79 | HR 71 | Ht 62.0 in | Wt 169.4 lb

## 2019-05-21 DIAGNOSIS — Z8543 Personal history of malignant neoplasm of ovary: Secondary | ICD-10-CM | POA: Diagnosis not present

## 2019-05-21 DIAGNOSIS — K439 Ventral hernia without obstruction or gangrene: Secondary | ICD-10-CM | POA: Diagnosis not present

## 2019-05-21 DIAGNOSIS — Z90710 Acquired absence of both cervix and uterus: Secondary | ICD-10-CM | POA: Diagnosis not present

## 2019-05-21 DIAGNOSIS — Z87891 Personal history of nicotine dependence: Secondary | ICD-10-CM

## 2019-05-21 DIAGNOSIS — N816 Rectocele: Secondary | ICD-10-CM

## 2019-05-21 DIAGNOSIS — C561 Malignant neoplasm of right ovary: Secondary | ICD-10-CM

## 2019-05-21 NOTE — Patient Instructions (Signed)
Please notify Dr Denman George at phone number [phone] if you notice vaginal bleeding, new pelvic or abdominal pains, bloating, feeling full easy, or a change in bladder or bowel function.   Dr Denman George will see you in 6 months after labwork to monitor.

## 2019-05-21 NOTE — Progress Notes (Signed)
Follow-up Note: Gyn-Onc  Consult was requested by Dr. Marisue Humble and Dr Laurann Montana for the evaluation of [name] 63 y.o. female  CC:  Chief Complaint  Patient presents with  . Ovarian cancer on right [facility])    Assessment/Plan:  [name]  is a 63 y.o.  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 No measurable disease.   I discussed the risk for recurrence associated with this disease. I discussed symptoms consistent with recurrence and for her to notify me of these should they occur.  I recommend 6 monthly evaluations with the next in June, 2021 with CEA and CA 125.  Rectocele and vaginal dryness - treated with premarin. S/p consult with Dr Zigmund Daniel.   Ventral hernia - seeing Dr Grandville Silos for this.   HPI: Jenan Frazier is a 63 year old parous (P2) woman who is seen in consultation at the request of Dr Marisue Humble and Dr Laurann Montana for a complex cystic and solid pelvic mass.  The patient reported to her PCP's that she had abdominal bloating and fullness for 6-8 weeks since October 2017 when she saw them on 04/25/16. They performed a pelvic examination on that visit and appreciated a pelvic mass. A TVUS was ordered and performed on 05/04/16 and revealed a uterus measuring 8.4x3.7x4.8cm with a 7cm endometrial stripe (she has had no bleeding symptoms) and the ovaries were not discretely seen. Instead, there is a large complex cystic and solid mass at midline, extending into the right and left adnexum with internal blood flow. The mass measures 11 x 7.4 x 14.1 cm. Separate ovaries are not identified.  A CT scan had been ordered but canceled upon the US findings and plan for consultation with me.  She is feeling fairly well. Her pants fit differently lately and she feels she may have lost weight. Her bladder and bowels are moving normally.  She denies pain.  She has no family history for  breast/ovarian cancer or any other significant cancer history.  Her surgical history is significant for tonsillectomy only (no abdominal surgery).  CT scan on 05/16/16 showed: large (15cm) complex mass in the central anatomic pelvis appears to arise from the right ovary and is highly worrisome for a cystic ovarian carcinoma and a small amount of pelvic free fluid. CA 125 on 05/14/16 was elevated at 72.  On 05/31/16 she went to the OR for an exploratory laparotomy, TAH, BSO, omentectomy and peritoneal biopsies (no lymphadenectomy due to frozen section showing mucinous cell type).Intraoperative findings were significant for a 15cm right ovarian mass non-adherent, and otherwise normal peritoneal cavity and appendix. Frozen showed mucinous adencarcinoma.  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  Interval Hx:  CA 125 on 11/15/16 was normal at 11.9 CEA on 11/15/16 was normal at <1  CA 125 on 02/18/17 was normal at 14 CEA on 02/18/17 was normal at 1  CA 125 on 05/24/17 was normal at 11.6 CEA on 05/24/17 was normal at <1  CA 125 on 07/29/17 was normal at 13.3 CEA on 07/29/17 was normal at <1  CA 125 on 11/07/17 was normal at 12.4 CEA on 11/07/17 was normal at <1.   CT abd/pelvis and chest on 08/16/17 showed no evidence of recurrence and stable pulm and peritoneal nodules.   CA 125 on 02/24/18 was normal at 10 CEA on 02/24/18  was normal at <1.   CA 125 on 05/26/18 was normal at 10 CEA on 05/26/18 was normal at 1.31  CA 125 on 05/19/19 was normal at 9.3 CEA on 05/19/19 was normal at <1  She has had some symptoms of intermittent diverticulitis. Today we discussed survivorship issues including diet and exercise, emotional wellness and spirituality, sexual function, surveillance schedule.  Current Meds:  Outpatient Encounter Medications as of 05/21/2019  Medication Sig  . fluticasone (FLONASE) 50  MCG/ACT nasal spray Place 1 spray into both nostrils daily.  . hydroxypropyl methylcellulose / hypromellose (ISOPTO TEARS / GONIOVISC) 2.5 % ophthalmic solution Place 1-2 drops into both eyes 3 (three) times daily as needed for dry eyes.  . Multiple Vitamin (MULTIVITAMIN) tablet Take 1 tablet by mouth daily.  . [DISCONTINUED] amoxicillin-clavulanate (AUGMENTIN) 875-125 MG tablet Take 1 tablet by mouth every 12 (twelve) hours.  . [DISCONTINUED] APPLE CIDER VINEGAR PO Take 2 mLs by mouth.  . [DISCONTINUED] Biotin 10 MG CAPS Take by mouth.  . [DISCONTINUED] Black Currant Seed Oil 500 MG CAPS Take by mouth.  . [DISCONTINUED] conjugated estrogens (PREMARIN) vaginal cream Place 1 Applicatorful vaginally 3 (three) times a week.  . [DISCONTINUED] NON FORMULARY Patient uses essential oils as needed   No facility-administered encounter medications on file as of 05/21/2019.    Allergy: No Known Allergies  Social Hx:   Social History   Socioeconomic History  . Marital status: Married    Spouse name: Not on file  . Number of children: Not on file  . Years of education: Not on file  . Highest education level: Not on file  Occupational History  . Not on file  Tobacco Use  . Smoking status: Former Smoker    Years: 7.00    Types: Cigarettes    Quit date: 11/07/1985    Years since quitting: 33.5  . Smokeless tobacco: Never Used  Substance and Sexual Activity  . Alcohol use: No  . Drug use: No  . Sexual activity: Not on file  Other Topics Concern  . Not on file  Social History Narrative  . Not on file   Social Determinants of Health   Financial Resource Strain:   . Difficulty of Paying Living Expenses: Not on file  Food Insecurity:   . Worried About Charity fundraiser in the Last Year: Not on file  . Ran Out of Food in the Last Year: Not on file  Transportation Needs:   . Lack of Transportation (Medical): Not on file  . Lack of Transportation (Non-Medical): Not on file  Physical  Activity:   . Days of Exercise per Week: Not on file  . Minutes of Exercise per Session: Not on file  Stress:   . Feeling of Stress : Not on file  Social Connections:   . Frequency of Communication with Friends and Family: Not on file  . Frequency of Social Gatherings with Friends and Family: Not on file  . Attends Religious Services: Not on file  . Active Member of Clubs or Organizations: Not on file  . Attends Archivist Meetings: Not on file  . Marital Status: Not on file  Intimate Partner Violence:   . Fear of Current or Ex-Partner: Not on file  . Emotionally Abused: Not on file  . Physically Abused: Not on file  . Sexually Abused: Not on file    Past Surgical Hx:  Past Surgical History:  Procedure Laterality Date  . ABDOMINAL HYSTERECTOMY Left 05/31/2016  Procedure: HYSTERECTOMY ABDOMINAL;  Surgeon: Everitt Amber, MD;  Location: [facility];  Service: Gynecology;  Laterality: Left;  . BREAST SURGERY  1993   left breast-calcification  . COLONOSCOPY    . LAPAROTOMY N/A 05/31/2016   Procedure: EXPLORATORY LAPAROTOMY;  Surgeon: Everitt Amber, MD;  Location: [facility];  Service: Gynecology;  Laterality: N/A;  . OMENTECTOMY  05/31/2016   Procedure: OMENTECTOMY AND STAGING FOR OVARIAN CANCER;  Surgeon: Everitt Amber, MD;  Location: [facility];  Service: Gynecology;;  . SALPINGOOPHORECTOMY Right 05/31/2016   Procedure: BILATERAL SALPINGO OOPHORECTOMY;  Surgeon: Everitt Amber, MD;  Location: [facility];  Service: Gynecology;  Laterality: Right;  . TONSILLECTOMY      Past Medical Hx:  Past Medical History:  Diagnosis Date  . 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 Dyslipidemia   . Family history of early CAD   . Genetic testing 09/28/2016   Genetic testing revealed ONE pathogenic mutation in [place]. Though a pathogenic mutation was identified, increased risk for cancer is only associated with TWO (biallelic) pathogenic mutations. No additional mutations or variants of  uncertain significance were identified. Testing was performed through [facility]'s 46-gene Common Hereditary Cancers Panel. [facility]'s Common Hereditary Cancers Panel includ  . GERD (gastroesophageal reflux disease)     Past Gynecological History:  SVD x 2 No LMP recorded. Patient has had a hysterectomy.  Family Hx:  Family History  Problem Relation Age of Onset  . Pancreatic cancer Mother 1       d.73 - two months after diagnosis  . Addison's disease Maternal Aunt 76  . Leukemia Maternal Uncle 31       d.59 - shortly after diagnosis  . Melanoma Daughter 27    Review of Systems:  Constitutional  Feels well,    ENT Normal appearing ears and nares bilaterally Skin/Breast  No rash, sores, jaundice, itching, dryness Cardiovascular  No chest pain, shortness of breath, or edema  Pulmonary  No cough or wheeze.  Gastro Intestinal  No nausea, vomitting, or diarrhoea. No bright red blood per rectum, no abdominal pain, change in bowel movement, or constipation.  Genito Urinary  No frequency, urgency, dysuria, no bleeding Musculo Skeletal  No myalgia, arthralgia, joint swelling or pain  Neurologic  No weakness, numbness, change in gait,  Psychology  No depression, anxiety, insomnia.   Vitals:  Blood pressure (!) 142/79, pulse 71, height 5\' 2"  (1.575 m), weight 169 lb 7 oz (76.9 kg), SpO2 99 %, peak flow (!) 16 L/min.  Physical Exam: WD in NAD Neck  Supple NROM, without any enlargements.  Lymph Node Survey No cervical supraclavicular or inguinal adenopathy Cardiovascular  Pulse normal rate, regularity and rhythm. S1 and S2 normal.  Lungs  Clear to auscultation bilateraly, without wheezes/crackles/rhonchi. Good air movement.  Skin  No rash/lesions/breakdown  Psychiatry  Alert and oriented to person, place, and time  Abdomen  Normoactive bowel sounds, abdomen soft, non-tender and nonobese. Right paramedian incisional hernia - no incarceration or tenderness.  Back No CVA  tenderness Genito Urinary: vaginal cuff in tact, no lesions. No masses. She has posterior vaginal wall prolapse (rectocele). Rectal  deferred  Extremities  No bilateral cyanosis, clubbing or edema.   Thereasa Solo, MD  05/21/2019, 2:45 PM

## 2019-10-14 ENCOUNTER — Other Ambulatory Visit: Payer: Self-pay

## 2019-10-14 ENCOUNTER — Ambulatory Visit (INDEPENDENT_AMBULATORY_CARE_PROVIDER_SITE_OTHER): Payer: 59 | Admitting: Family Medicine

## 2019-10-14 DIAGNOSIS — Z23 Encounter for immunization: Secondary | ICD-10-CM

## 2019-11-03 ENCOUNTER — Other Ambulatory Visit: Payer: Self-pay

## 2019-11-03 ENCOUNTER — Inpatient Hospital Stay: Payer: 59 | Attending: Gynecologic Oncology

## 2019-11-03 DIAGNOSIS — Z90722 Acquired absence of ovaries, bilateral: Secondary | ICD-10-CM | POA: Diagnosis not present

## 2019-11-03 DIAGNOSIS — K219 Gastro-esophageal reflux disease without esophagitis: Secondary | ICD-10-CM | POA: Insufficient documentation

## 2019-11-03 DIAGNOSIS — K439 Ventral hernia without obstruction or gangrene: Secondary | ICD-10-CM | POA: Insufficient documentation

## 2019-11-03 DIAGNOSIS — Z90710 Acquired absence of both cervix and uterus: Secondary | ICD-10-CM | POA: Insufficient documentation

## 2019-11-03 DIAGNOSIS — Z87891 Personal history of nicotine dependence: Secondary | ICD-10-CM | POA: Insufficient documentation

## 2019-11-03 DIAGNOSIS — E785 Hyperlipidemia, unspecified: Secondary | ICD-10-CM | POA: Insufficient documentation

## 2019-11-03 DIAGNOSIS — C561 Malignant neoplasm of right ovary: Secondary | ICD-10-CM | POA: Insufficient documentation

## 2019-11-03 DIAGNOSIS — N816 Rectocele: Secondary | ICD-10-CM | POA: Insufficient documentation

## 2019-11-04 LAB — CEA (IN HOUSE-CHCC): CEA (CHCC-In House): 1.17 ng/mL (ref 0.00–5.00)

## 2019-11-04 LAB — CA 125: Cancer Antigen (CA) 125: 10.8 U/mL (ref 0.0–38.1)

## 2019-11-05 ENCOUNTER — Encounter: Payer: Self-pay | Admitting: Gynecologic Oncology

## 2019-11-05 ENCOUNTER — Other Ambulatory Visit: Payer: Self-pay

## 2019-11-05 ENCOUNTER — Inpatient Hospital Stay (HOSPITAL_BASED_OUTPATIENT_CLINIC_OR_DEPARTMENT_OTHER): Payer: 59 | Admitting: Gynecologic Oncology

## 2019-11-05 VITALS — BP 132/69 | HR 71 | Temp 98.1°F | Resp 18 | Ht 62.0 in | Wt 176.2 lb

## 2019-11-05 DIAGNOSIS — C561 Malignant neoplasm of right ovary: Secondary | ICD-10-CM

## 2019-11-05 NOTE — Progress Notes (Signed)
Follow-up Note: Gyn-Onc  Consult was requested by Dr. Marisue Humble and Dr Laurann Montana for the evaluation of Theresa Frazier 64 y.o. female  CC:  Chief Complaint  Patient presents with  . Ovarian cancer on right [facility])    Follow Up    Assessment/Plan:  Theresa Frazier  is a 64 y.o.  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 No measurable disease.   I discussed the risk for recurrence associated with this disease. I discussed symptoms consistent with recurrence and for her to notify me of these should they occur.  I recommend 6 monthly evaluations with the next in June, 2021 with CEA and CA 125.  Rectocele and vaginal dryness - treated with premarin. S/p consult with Dr Zigmund Daniel.   Ventral hernia - seeing Dr Grandville Silos for this.   HPI: Theresa Frazier is a 64 year old parous (P2) woman who is seen in consultation at the request of Dr Marisue Humble and Dr Laurann Montana for a complex cystic and solid pelvic mass.  The patient reported to her PCP's that she had abdominal bloating and fullness for 6-8 weeks since October 2017 when she saw them on 04/25/16. They performed a pelvic examination on that visit and appreciated a pelvic mass. A TVUS was ordered and performed on 05/04/16 and revealed a uterus measuring 8.4x3.7x4.8cm with a 7cm endometrial stripe (she has had no bleeding symptoms) and the ovaries were not discretely seen. Instead, there is a large complex cystic and solid mass at midline, extending into the right and left adnexum with internal blood flow. The mass measures 11 x 7.4 x 14.1 cm. Separate ovaries are not identified.  A CT scan had been ordered but canceled upon the US findings and plan for consultation with me.  She is feeling fairly well. Her pants fit differently lately and she feels she may have lost weight. Her bladder and bowels are moving normally.  She denies pain.  She has no family history  for breast/ovarian cancer or any other significant cancer history.  Her surgical history is significant for tonsillectomy only (no abdominal surgery).  CT scan on 05/16/16 showed: large (15cm) complex mass in the central anatomic pelvis appears to arise from the right ovary and is highly worrisome for a cystic ovarian carcinoma and a small amount of pelvic free fluid. CA 125 on 05/14/16 was elevated at 72.  On 05/31/16 she went to the OR for an exploratory laparotomy, TAH, BSO, omentectomy and peritoneal biopsies (no lymphadenectomy due to frozen section showing mucinous cell type).Intraoperative findings were significant for a 15cm right ovarian mass non-adherent, and otherwise normal peritoneal cavity and appendix. Frozen showed mucinous adencarcinoma.  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  CA 125 on 11/15/16 was normal at 11.9 CEA on 11/15/16 was normal at <1  CA 125 on 02/18/17 was normal at 14 CEA on 02/18/17 was normal at 1  CA 125 on 05/24/17 was normal at 11.6 CEA on 05/24/17 was normal at <1  CA 125 on 07/29/17 was normal at 13.3 CEA on 07/29/17 was normal at <1  CA 125 on 11/07/17 was normal at 12.4 CEA on 11/07/17 was normal at <1.   CT abd/pelvis and chest on 08/16/17 showed no evidence of recurrence and stable pulm and peritoneal nodules.   CA 125 on 02/24/18 was normal at 10 CEA  on 02/24/18 was normal at <1.   CA 125 on 05/26/18 was normal at 10 CEA on 05/26/18 was normal at 1.31  CA 125 on 05/19/19 was normal at 9.3 CEA on 05/19/19 was normal at <1   Interval Hx: CA 125 on 6/1/21was normal at 10.8 CEA on 11/03/19 was normal at <1   Current Meds:  Outpatient Encounter Medications as of 11/05/2019  Medication Sig  . fluticasone (FLONASE) 50 MCG/ACT nasal spray Place 1 spray into both nostrils daily.  . hydroxypropyl methylcellulose / hypromellose (ISOPTO TEARS / GONIOVISC)  2.5 % ophthalmic solution Place 1-2 drops into both eyes 3 (three) times daily as needed for dry eyes.  . Multiple Vitamin (MULTIVITAMIN) tablet Take 1 tablet by mouth daily.   No facility-administered encounter medications on file as of 11/05/2019.    Allergy: No Known Allergies  Social Hx:   Social History   Socioeconomic History  . Marital status: Married    Spouse name: Not on file  . Number of children: Not on file  . Years of education: Not on file  . Highest education level: Not on file  Occupational History  . Not on file  Tobacco Use  . Smoking status: Former Smoker    Years: 7.00    Types: Cigarettes    Quit date: 11/07/1985    Years since quitting: 34.0  . Smokeless tobacco: Never Used  Substance and Sexual Activity  . Alcohol use: No  . Drug use: No  . Sexual activity: Not on file  Other Topics Concern  . Not on file  Social History Narrative  . Not on file   Social Determinants of Health   Financial Resource Strain:   . Difficulty of Paying Living Expenses:   Food Insecurity:   . Worried About Charity fundraiser in the Last Year:   . Arboriculturist in the Last Year:   Transportation Needs:   . Film/video editor (Medical):   Marland Kitchen Lack of Transportation (Non-Medical):   Physical Activity:   . Days of Exercise per Week:   . Minutes of Exercise per Session:   Stress:   . Feeling of Stress :   Social Connections:   . Frequency of Communication with Friends and Family:   . Frequency of Social Gatherings with Friends and Family:   . Attends Religious Services:   . Active Member of Clubs or Organizations:   . Attends Archivist Meetings:   Marland Kitchen Marital Status:   Intimate Partner Violence:   . Fear of Current or Ex-Partner:   . Emotionally Abused:   Marland Kitchen Physically Abused:   . Sexually Abused:     Past Surgical Hx:  Past Surgical History:  Procedure Laterality Date  . ABDOMINAL HYSTERECTOMY Left 05/31/2016   Procedure: HYSTERECTOMY  ABDOMINAL;  Surgeon: Everitt Amber, MD;  Location: [facility];  Service: Gynecology;  Laterality: Left;  . BREAST SURGERY  1993   left breast-calcification  . COLONOSCOPY    . LAPAROTOMY N/A 05/31/2016   Procedure: EXPLORATORY LAPAROTOMY;  Surgeon: Everitt Amber, MD;  Location: [facility];  Service: Gynecology;  Laterality: N/A;  . OMENTECTOMY  05/31/2016   Procedure: OMENTECTOMY AND STAGING FOR OVARIAN CANCER;  Surgeon: Everitt Amber, MD;  Location: [facility];  Service: Gynecology;;  . SALPINGOOPHORECTOMY Right 05/31/2016   Procedure: BILATERAL SALPINGO OOPHORECTOMY;  Surgeon: Everitt Amber, MD;  Location: [facility];  Service: Gynecology;  Laterality: Right;  . TONSILLECTOMY      Past Medical  Hx:  Past Medical History:  Diagnosis Date  . 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 Dyslipidemia   . Family history of early CAD   . Genetic testing 09/28/2016   Genetic testing revealed ONE pathogenic mutation in [place]. Though a pathogenic mutation was identified, increased risk for cancer is only associated with TWO (biallelic) pathogenic mutations. No additional mutations or variants of uncertain significance were identified. Testing was performed through [facility]'s 46-gene Common Hereditary Cancers Panel. [facility]'s Common Hereditary Cancers Panel includ  . GERD (gastroesophageal reflux disease)     Past Gynecological History:  SVD x 2 No LMP recorded. Patient has had a hysterectomy.  Family Hx:  Family History  Problem Relation Age of Onset  . Pancreatic cancer Mother 53       d.73 - two months after diagnosis  . Addison's disease Maternal Aunt 76  . Leukemia Maternal Uncle 18       d.59 - shortly after diagnosis  . Melanoma Daughter 52    Review of Systems:  Constitutional  Feels well,    ENT Normal appearing ears and nares bilaterally Skin/Breast  No rash, sores, jaundice, itching, dryness Cardiovascular  No chest pain, shortness of breath, or edema  Pulmonary  No cough or  wheeze.  Gastro Intestinal  No nausea, vomitting, or diarrhoea. No bright red blood per rectum, no abdominal pain, change in bowel movement, or constipation.  Genito Urinary  No frequency, urgency, dysuria, no bleeding Musculo Skeletal  No myalgia, arthralgia, joint swelling or pain  Neurologic  No weakness, numbness, change in gait,  Psychology  No depression, anxiety, insomnia.   Vitals:  Blood pressure 132/69, pulse 71, temperature 98.1 F (36.7 C), temperature source Tympanic, resp. rate 18, height 5\' 2"  (1.575 m), weight 176 lb 3.2 oz (79.9 kg), SpO2 100 %.  Physical Exam: WD in NAD Neck  Supple NROM, without any enlargements.  Lymph Node Survey No cervical supraclavicular or inguinal adenopathy Cardiovascular  Pulse normal rate, regularity and rhythm. S1 and S2 normal.  Lungs  Clear to auscultation bilateraly, without wheezes/crackles/rhonchi. Good air movement.  Skin  No rash/lesions/breakdown  Psychiatry  Alert and oriented to person, place, and time  Abdomen  Normoactive bowel sounds, abdomen soft, non-tender and nonobese. Right paramedian incisional hernia - no incarceration or tenderness.  Back No CVA tenderness Genito Urinary: vaginal cuff in tact, no lesions. No masses. She has posterior vaginal wall prolapse (rectocele). Rectal  deferred  Extremities  No bilateral cyanosis, clubbing or edema.   Thereasa Solo, MD  11/05/2019, 2:51 PM

## 2019-11-05 NOTE — Patient Instructions (Addendum)
Please notify Dr Denman George at phone number [phone] if you notice vaginal bleeding, new pelvic or abdominal pains, bloating, feeling full easy, or a change in bladder or bowel function.   Please return to see Dr Denman George in December for labs and then an appointment.   Recommendation for PCP would be Dr. Ethlyn Gallery at [facility].

## 2020-01-29 ENCOUNTER — Other Ambulatory Visit: Payer: Self-pay

## 2020-01-29 ENCOUNTER — Encounter: Payer: Self-pay | Admitting: Family Medicine

## 2020-01-29 ENCOUNTER — Ambulatory Visit: Payer: 59 | Admitting: Family Medicine

## 2020-01-29 VITALS — BP 132/72 | HR 65 | Temp 98.4°F | Ht 62.0 in | Wt 174.5 lb

## 2020-01-29 DIAGNOSIS — E538 Deficiency of other specified B group vitamins: Secondary | ICD-10-CM

## 2020-01-29 DIAGNOSIS — E782 Mixed hyperlipidemia: Secondary | ICD-10-CM

## 2020-01-29 DIAGNOSIS — Z23 Encounter for immunization: Secondary | ICD-10-CM

## 2020-01-29 DIAGNOSIS — Z8632 Personal history of gestational diabetes: Secondary | ICD-10-CM

## 2020-01-29 DIAGNOSIS — E559 Vitamin D deficiency, unspecified: Secondary | ICD-10-CM

## 2020-01-29 DIAGNOSIS — R5383 Other fatigue: Secondary | ICD-10-CM

## 2020-01-29 NOTE — Progress Notes (Signed)
Theresa Frazier DOB: [birth year] Encounter date: 01/29/2020  This isa 64 y.o. female who presents to establish care. Chief Complaint  Patient presents with  . Establish Care    History of present illness: Follows with Dr. Denman George for history of R ovarian cancer.   *diverticulosis - popcorn is comfort food and notes that this isn't sitting as well anymore. 1-2 weeks ago got raw carrots and hummus and a couple of hours later got gas; but just kept getting worse. Very uncomfortable - felt like sticks going into abdomen. Ate skinny pop which felt like it set her back. Still more sensitive. Usually 1-2 bowel movements every morning. No blood in stools.   *hernia - right lower abd. More pronounced which bothers her, but doesn't want to have mesh for correction. Just watching this. Seems more sensitive in morning but responds well to massage in the morning.   *eye sight, dizzy - goes yearly to [facility] ophthalmology. Appointment this year is in October. Was sitting at computer screen (in last few weeks) and all of a sudden right eye had black in visual field - like old school flash in eye. No pain; vision seemed same after that time. Felt like there was color around this. Dizziness - thinks she has had vertigo one time - not extreme but enough she didn't want to go to work.   Yesterday woke and felt like eyes weren't focusing. Dizziness that progressed through day. Today feels ok. Does note a bad neck - scanned in remote past -arthritis. Thinks if she had to look up and down quickly she would get sick. Usually ok with side to side movement and able to do inversion table in morning. No nausea, vomiting. No orthostatic symptoms. No ringing in ears or difficulty with hearing. No heart racing no trouble breathing.  Follows regularly with derm; [facility].   Urinary incontinence - worked with specialist. Wears cloth pad; does ok with this. Has some urgency.   GERD no longer issue   Past Medical  History:  Diagnosis Date  . 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 Dyslipidemia   . Family history of early CAD   . Genetic testing 09/28/2016   Genetic testing revealed ONE pathogenic mutation in [place]. Though a pathogenic mutation was identified, increased risk for cancer is only associated with TWO (biallelic) pathogenic mutations. No additional mutations or variants of uncertain significance were identified. Testing was performed through [facility]'s 46-gene Common Hereditary Cancers Panel. [facility]'s Common Hereditary Cancers Panel includ  . GERD (gastroesophageal reflux disease)    Past Surgical History:  Procedure Laterality Date  . ABDOMINAL HYSTERECTOMY Left 05/31/2016   Procedure: HYSTERECTOMY ABDOMINAL;  Surgeon: Everitt Amber, MD;  Location: [facility];  Service: Gynecology;  Laterality: Left;  . BREAST SURGERY  1993   left breast-calcification  . COLONOSCOPY    . LAPAROTOMY N/A 05/31/2016   Procedure: EXPLORATORY LAPAROTOMY;  Surgeon: Everitt Amber, MD;  Location: [facility];  Service: Gynecology;  Laterality: N/A;  . OMENTECTOMY  05/31/2016   Procedure: OMENTECTOMY AND STAGING FOR OVARIAN CANCER;  Surgeon: Everitt Amber, MD;  Location: [facility];  Service: Gynecology;;  . SALPINGOOPHORECTOMY Right 05/31/2016   Procedure: BILATERAL SALPINGO OOPHORECTOMY;  Surgeon: Everitt Amber, MD;  Location: [facility];  Service: Gynecology;  Laterality: Right;  . TONSILLECTOMY     No Known Allergies Current Meds  Medication Sig  . fluticasone (FLONASE) 50 MCG/ACT nasal spray Place 1 spray into both nostrils daily.  Marland Kitchen  hydroxypropyl methylcellulose / hypromellose (ISOPTO TEARS / GONIOVISC) 2.5 % ophthalmic solution Place 1-2 drops into both eyes 3 (three) times daily as needed for dry eyes.  . pseudoephedrine (SUDAFED) 30 MG tablet Take 30 mg by mouth as needed for congestion.   Social History   Tobacco Use  . Smoking status: Former Smoker    Years: 7.00    Types: Cigarettes    Quit date:  11/07/1985    Years since quitting: 34.2  . Smokeless tobacco: Never Used  Substance Use Topics  . Alcohol use: No   Family History  Problem Relation Age of Onset  . Pancreatic cancer Mother 48       d.73 - two months after diagnosis  . Depression Mother   . Diabetes Mother        at end of life  . Hearing loss Father   . Heart attack Father 76  . Heart disease Father   . Addison's disease Maternal Aunt 76  . Leukemia Maternal Uncle 28       d.59 - shortly after diagnosis  . Alcohol abuse Maternal Grandmother   . Drug abuse Maternal Grandfather   . Heart attack Paternal Grandfather   . Melanoma Daughter 29  . Hypertension Sister   . Alcohol abuse Brother   . Diabetes Brother   . Arthritis Brother   . Arthritis Sister   . Mental illness Sister        closed head injury as teen  . Alcohol abuse Sister      Review of Systems  Constitutional: Negative for chills, fatigue and fever.  Respiratory: Negative for cough, chest tightness, shortness of breath and wheezing.   Cardiovascular: Negative for chest pain, palpitations and leg swelling.    Objective:  BP 132/72 (BP Location: Left Arm, Patient Position: Sitting, Cuff Size: Large)   Pulse 65   Temp 98.4 F (36.9 C) (Oral)   Ht 5\' 2"  (1.575 m)   Wt 174 lb 8 oz (79.2 kg)   BMI 31.92 kg/m   Weight: 174 lb 8 oz (79.2 kg)   BP Readings from Last 3 Encounters:  01/29/20 132/72  11/05/19 132/69  05/21/19 (!) 142/79   Wt Readings from Last 3 Encounters:  01/29/20 174 lb 8 oz (79.2 kg)  11/05/19 176 lb 3.2 oz (79.9 kg)  05/21/19 169 lb 7 oz (76.9 kg)    Physical Exam Constitutional:      General: She is not in acute distress.    Appearance: She is well-developed.  Cardiovascular:     Rate and Rhythm: Normal rate and regular rhythm.     Heart sounds: Normal heart sounds. No murmur heard.  No friction rub.  Pulmonary:     Effort: Pulmonary effort is normal. No respiratory distress.     Breath sounds: Normal  breath sounds. No wheezing or rales.  Musculoskeletal:     Right lower leg: No edema.     Left lower leg: No edema.  Neurological:     Mental Status: She is alert and oriented to person, place, and time.  Psychiatric:        Behavior: Behavior normal.     Assessment/Plan: 1. Mixed hyperlipidemia Has been diet controlled. - Lipid panel; Future - Lipid panel  2. Fatigue, unspecified type - CBC with Differential/Platelet; Future - Comprehensive metabolic panel; Future - TSH; Future - TSH - Comprehensive metabolic panel - CBC with Differential/Platelet  3. History of gestational diabetes Diet controlled; encouraged keeping up with  regular exercise and healthy eating. - Hemoglobin A1c; Future - Hemoglobin A1c  4. Vitamin B12 deficiency - Vitamin B12; Future - Vitamin B12  5. Vitamin D deficiency - VITAMIN D 25 Hydroxy (Vit-D Deficiency, Fractures); Future - VITAMIN D 25 Hydroxy (Vit-D Deficiency, Fractures)  6. Need for shingles vaccine - Varicella-zoster vaccine IM (Shingrix)  Return for pending bloodwork.  Micheline Rough, MD

## 2020-01-30 LAB — HEMOGLOBIN A1C
Hgb A1c MFr Bld: 5.5 % of total Hgb (ref <5.7)
Mean Plasma Glucose: 111 (calc)
eAG (mmol/L): 6.2 (calc)

## 2020-01-30 LAB — CBC WITH DIFFERENTIAL/PLATELET
Absolute Monocytes: 435 cells/uL (ref 200–950)
Basophils Absolute: 70 cells/uL (ref 0–200)
Basophils Relative: 1.4 %
Eosinophils Absolute: 170 cells/uL (ref 15–500)
Eosinophils Relative: 3.4 %
HCT: 43.5 % (ref 35.0–45.0)
Hemoglobin: 13.9 g/dL (ref 11.7–15.5)
Lymphs Abs: 1800 cells/uL (ref 850–3900)
MCH: 30.2 pg (ref 27.0–33.0)
MCHC: 32 g/dL (ref 32.0–36.0)
MCV: 94.6 fL (ref 80.0–100.0)
MPV: 10.4 fL (ref 7.5–12.5)
Monocytes Relative: 8.7 %
Neutro Abs: 2525 cells/uL (ref 1500–7800)
Neutrophils Relative %: 50.5 %
Platelets: 262 10*3/uL (ref 140–400)
RBC: 4.6 10*6/uL (ref 3.80–5.10)
RDW: 11.8 % (ref 11.0–15.0)
Total Lymphocyte: 36 %
WBC: 5 10*3/uL (ref 3.8–10.8)

## 2020-01-30 LAB — COMPREHENSIVE METABOLIC PANEL
AG Ratio: 2.4 (calc) (ref 1.0–2.5)
ALT: 18 U/L (ref 6–29)
AST: 14 U/L (ref 10–35)
Albumin: 4.3 g/dL (ref 3.6–5.1)
Alkaline phosphatase (APISO): 77 U/L (ref 37–153)
BUN: 9 mg/dL (ref 7–25)
CO2: 27 mmol/L (ref 20–32)
Calcium: 9.5 mg/dL (ref 8.6–10.4)
Chloride: 106 mmol/L (ref 98–110)
Creat: 0.74 mg/dL (ref 0.50–0.99)
Globulin: 1.8 g/dL (calc) — ABNORMAL LOW (ref 1.9–3.7)
Glucose, Bld: 83 mg/dL (ref 65–99)
Potassium: 4.6 mmol/L (ref 3.5–5.3)
Sodium: 139 mmol/L (ref 135–146)
Total Bilirubin: 0.5 mg/dL (ref 0.2–1.2)
Total Protein: 6.1 g/dL (ref 6.1–8.1)

## 2020-01-30 LAB — VITAMIN B12: Vitamin B-12: 266 pg/mL (ref 200–1100)

## 2020-01-30 LAB — LIPID PANEL
Cholesterol: 228 mg/dL — ABNORMAL HIGH (ref <200)
HDL: 59 mg/dL (ref >=50)
LDL Cholesterol (Calc): 153 mg/dL (calc) — ABNORMAL HIGH
Non-HDL Cholesterol (Calc): 169 mg/dL (calc) — ABNORMAL HIGH (ref <130)
Total CHOL/HDL Ratio: 3.9 (calc) (ref <5.0)
Triglycerides: 66 mg/dL (ref <150)

## 2020-01-30 LAB — TSH: TSH: 1.41 mIU/L (ref 0.40–4.50)

## 2020-01-30 LAB — VITAMIN D 25 HYDROXY (VIT D DEFICIENCY, FRACTURES): Vit D, 25-Hydroxy: 12 ng/mL — ABNORMAL LOW (ref 30–100)

## 2020-02-02 ENCOUNTER — Telehealth: Payer: Self-pay | Admitting: Family Medicine

## 2020-02-02 MED ORDER — VITAMIN D (ERGOCALCIFEROL) 1.25 MG (50000 UNIT) PO CAPS: 50000.0000 [IU] | ORAL_CAPSULE | ORAL | 0 refills | Status: DC

## 2020-02-02 NOTE — Addendum Note (Signed)
Addended by: Agnes Lawrence on: 02/02/2020 03:12 PM   Modules accepted: Orders

## 2020-02-02 NOTE — Telephone Encounter (Signed)
Pt was returning Theresa Frazier call about results. Pt needs a call back

## 2020-02-02 NOTE — Telephone Encounter (Signed)
See results note. 

## 2020-04-18 ENCOUNTER — Other Ambulatory Visit: Payer: Self-pay | Admitting: Family Medicine

## 2020-04-18 DIAGNOSIS — Z1231 Encounter for screening mammogram for malignant neoplasm of breast: Secondary | ICD-10-CM

## 2020-04-19 ENCOUNTER — Ambulatory Visit: Payer: 59

## 2020-05-23 ENCOUNTER — Other Ambulatory Visit: Payer: Self-pay

## 2020-05-23 ENCOUNTER — Inpatient Hospital Stay: Payer: Medicare Other | Attending: Gynecologic Oncology

## 2020-05-23 DIAGNOSIS — C561 Malignant neoplasm of right ovary: Secondary | ICD-10-CM | POA: Diagnosis not present

## 2020-05-23 DIAGNOSIS — Z87891 Personal history of nicotine dependence: Secondary | ICD-10-CM | POA: Diagnosis not present

## 2020-05-23 DIAGNOSIS — Z90710 Acquired absence of both cervix and uterus: Secondary | ICD-10-CM | POA: Insufficient documentation

## 2020-05-23 DIAGNOSIS — K439 Ventral hernia without obstruction or gangrene: Secondary | ICD-10-CM | POA: Insufficient documentation

## 2020-05-23 DIAGNOSIS — N816 Rectocele: Secondary | ICD-10-CM | POA: Insufficient documentation

## 2020-05-23 DIAGNOSIS — Z90722 Acquired absence of ovaries, bilateral: Secondary | ICD-10-CM | POA: Insufficient documentation

## 2020-05-23 LAB — CEA (IN HOUSE-CHCC): CEA (CHCC-In House): <1 ng/mL (ref 0.00–5.00)

## 2020-05-24 ENCOUNTER — Encounter: Payer: Self-pay | Admitting: Gynecologic Oncology

## 2020-05-24 LAB — CA 125: Cancer Antigen (CA) 125: 12.8 U/mL (ref 0.0–38.1)

## 2020-05-26 ENCOUNTER — Encounter: Payer: Self-pay | Admitting: Gynecologic Oncology

## 2020-05-26 ENCOUNTER — Inpatient Hospital Stay (HOSPITAL_BASED_OUTPATIENT_CLINIC_OR_DEPARTMENT_OTHER): Payer: Medicare Other | Admitting: Gynecologic Oncology

## 2020-05-26 ENCOUNTER — Other Ambulatory Visit: Payer: Self-pay

## 2020-05-26 VITALS — BP 136/87 | HR 82 | Temp 97.4°F | Resp 17 | Ht 62.0 in | Wt 175.9 lb

## 2020-05-26 DIAGNOSIS — N816 Rectocele: Secondary | ICD-10-CM | POA: Diagnosis not present

## 2020-05-26 DIAGNOSIS — C561 Malignant neoplasm of right ovary: Secondary | ICD-10-CM | POA: Diagnosis not present

## 2020-05-26 DIAGNOSIS — Z87891 Personal history of nicotine dependence: Secondary | ICD-10-CM | POA: Diagnosis not present

## 2020-05-26 DIAGNOSIS — Z90710 Acquired absence of both cervix and uterus: Secondary | ICD-10-CM | POA: Diagnosis not present

## 2020-05-26 DIAGNOSIS — K439 Ventral hernia without obstruction or gangrene: Secondary | ICD-10-CM | POA: Diagnosis not present

## 2020-05-26 DIAGNOSIS — Z90722 Acquired absence of ovaries, bilateral: Secondary | ICD-10-CM | POA: Diagnosis not present

## 2020-05-26 NOTE — Patient Instructions (Signed)
Please return to see Dr Denman George in 6 months after your lab tests (as scheduled).  Please notify Dr Denman George at phone number [phone] if you notice vaginal bleeding, new pelvic or abdominal pains, bloating, feeling full easy, or a change in bladder or bowel function.

## 2020-05-26 NOTE — Progress Notes (Signed)
Follow-up Note: Gyn-Onc  Consult was requested by Dr. Marisue Humble and Dr Laurann Montana for the evaluation of Theresa Frazier 64 y.o. female  CC:  Chief Complaint  Patient presents with  . Ovarian cancer on right [facility])    Assessment/Plan:  Theresa Frazier  is a 64 y.o.  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 No measurable disease.   I discussed the risk for recurrence associated with this disease. I discussed symptoms consistent with recurrence and for her to notify me of these should they occur.  I recommend 6 monthly evaluations with the next in June, 2022 with CEA and CA 125.  Rectocele and vaginal dryness - treated with premarin. S/p consult with Dr Zigmund Daniel.   Ventral hernia - seeing Dr Grandville Silos for this.   HPI: Theresa Frazier is a 64 year old parous (P2) woman who was seen in consultation at the request of Dr Marisue Humble and Dr Laurann Montana for a complex cystic and solid pelvic mass.  The patient reported to her PCP's that she had abdominal bloating and fullness for 6-8 weeks since October 2017 when she saw them on 04/25/16. They performed a pelvic examination on that visit and appreciated a pelvic mass. A TVUS was ordered and performed on 05/04/16 and revealed a uterus measuring 8.4x3.7x4.8cm with a 7cm endometrial stripe (she has had no bleeding symptoms) and the ovaries were not discretely seen. Instead, there was a large complex cystic and solid mass at midline, extending into the right and left adnexum with internal blood flow. The mass measures 11 x 7.4 x 14.1 cm. Separate ovaries are not identified.  CT scan on 05/16/16 showed: large (15cm) complex mass in the central anatomic pelvis appears to arise from the right ovary and was highly worrisome for a cystic ovarian carcinoma and a small amount of pelvic free fluid. CA 125 on 05/14/16 was elevated at 72.  On 05/31/16 she went to the OR for an  exploratory laparotomy, TAH, BSO, omentectomy and peritoneal biopsies (no lymphadenectomy due to frozen section showing mucinous cell type).Intraoperative findings were significant for a 15cm right ovarian mass non-adherent, and otherwise normal peritoneal cavity and appendix. Frozen showed mucinous adencarcinoma.  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  CA 125 on 11/15/16 was normal at 11.9 CEA on 11/15/16 was normal at <1  CA 125 on 02/18/17 was normal at 14 CEA on 02/18/17 was normal at 1  CA 125 on 05/24/17 was normal at 11.6 CEA on 05/24/17 was normal at <1  CA 125 on 07/29/17 was normal at 13.3 CEA on 07/29/17 was normal at <1  CA 125 on 11/07/17 was normal at 12.4 CEA on 11/07/17 was normal at <1.   CT abd/pelvis and chest on 08/16/17 showed no evidence of recurrence and stable pulm and peritoneal nodules.   CA 125 on 02/24/18 was normal at 10 CEA on 02/24/18 was normal at <1.   CA 125 on 05/26/18 was normal at 10 CEA on 05/26/18 was normal at 1.31  CA 125 on 05/19/19 was normal at 9.3 CEA on 05/19/19 was normal at <1   CA 125 on 6/1/21was normal at 10.8 CEA on 11/03/19 was normal at <1   Interval Hx: CA 125 on 05/23/20 was normal at 12.8 CEA on 05/23/20 was normal at <1   Current Meds:  Outpatient Encounter  Medications as of 05/26/2020  Medication Sig  . fluticasone (FLONASE) 50 MCG/ACT nasal spray Place 1 spray into both nostrils daily. (Patient taking differently: Place 1 spray into both nostrils daily as needed.)  . hydroxypropyl methylcellulose / hypromellose (ISOPTO TEARS / GONIOVISC) 2.5 % ophthalmic solution Place 1-2 drops into both eyes 3 (three) times daily as needed for dry eyes.  . pseudoephedrine (SUDAFED) 30 MG tablet Take 30 mg by mouth as needed for congestion.  . [DISCONTINUED] Vitamin D, Ergocalciferol, (DRISDOL) 1.25 MG (50000 UNIT) CAPS capsule Take 1 capsule  (50,000 Units total) by mouth every 7 (seven) days.   No facility-administered encounter medications on file as of 05/26/2020.    Allergy: No Known Allergies  Social Hx:   Social History   Socioeconomic History  . Marital status: Married    Spouse name: Not on file  . Number of children: Not on file  . Years of education: Not on file  . Highest education level: Not on file  Occupational History  . Not on file  Tobacco Use  . Smoking status: Former Smoker    Years: 7.00    Types: Cigarettes    Quit date: 11/07/1985    Years since quitting: 34.5  . Smokeless tobacco: Never Used  Vaping Use  . Vaping Use: Never used  Substance and Sexual Activity  . Alcohol use: No  . Drug use: No  . Sexual activity: Yes  Other Topics Concern  . Not on file  Social History Narrative  . Not on file   Social Determinants of Health   Financial Resource Strain: Not on file  Food Insecurity: Not on file  Transportation Needs: Not on file  Physical Activity: Not on file  Stress: Not on file  Social Connections: Not on file  Intimate Partner Violence: Not on file    Past Surgical Hx:  Past Surgical History:  Procedure Laterality Date  . ABDOMINAL HYSTERECTOMY Left 05/31/2016   Procedure: HYSTERECTOMY ABDOMINAL;  Surgeon: Everitt Amber, MD;  Location: [facility];  Service: Gynecology;  Laterality: Left;  . BREAST SURGERY  1993   left breast-calcification  . COLONOSCOPY    . LAPAROTOMY N/A 05/31/2016   Procedure: EXPLORATORY LAPAROTOMY;  Surgeon: Everitt Amber, MD;  Location: [facility];  Service: Gynecology;  Laterality: N/A;  . OMENTECTOMY  05/31/2016   Procedure: OMENTECTOMY AND STAGING FOR OVARIAN CANCER;  Surgeon: Everitt Amber, MD;  Location: [facility];  Service: Gynecology;;  . SALPINGOOPHORECTOMY Right 05/31/2016   Procedure: BILATERAL SALPINGO OOPHORECTOMY;  Surgeon: Everitt Amber, MD;  Location: [facility];  Service: Gynecology;  Laterality: Right;  . TONSILLECTOMY      Past Medical Hx:  Past  Medical History:  Diagnosis Date  . 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 Dyslipidemia   . Family history of early CAD   . Genetic testing 09/28/2016   Genetic testing revealed ONE pathogenic mutation in [place]. Though a pathogenic mutation was identified, increased risk for cancer is only associated with TWO (biallelic) pathogenic mutations. No additional mutations or variants of uncertain significance were identified. Testing was performed through [facility]'s 46-gene Common Hereditary Cancers Panel. [facility]'s Common Hereditary Cancers Panel includ  . GERD (gastroesophageal reflux disease)     Past Gynecological History:  SVD x 2 No LMP recorded. Patient has had a hysterectomy.  Family Hx:  Family History  Problem Relation Age of Onset  . Pancreatic cancer Mother 17       d.73 -  two months after diagnosis  . Depression Mother   . Diabetes Mother        at end of life  . Hearing loss Father   . Heart attack Father 25  . Heart disease Father   . Addison's disease Maternal Aunt 76  . Leukemia Maternal Uncle 3       d.59 - shortly after diagnosis  . Alcohol abuse Maternal Grandmother   . Drug abuse Maternal Grandfather   . Heart attack Paternal Grandfather   . Melanoma Daughter 47  . Hypertension Sister   . Alcohol abuse Brother   . Diabetes Brother   . Arthritis Brother   . Arthritis Sister   . Mental illness Sister        closed head injury as teen  . Alcohol abuse Sister     Review of Systems:  Constitutional  Feels well,    ENT Normal appearing ears and nares bilaterally Skin/Breast  No rash, sores, jaundice, itching, dryness Cardiovascular  No chest pain, shortness of breath, or edema  Pulmonary  No cough or wheeze.  Gastro Intestinal  No nausea, vomitting, or diarrhoea. No bright red blood per rectum, no abdominal pain, change in bowel movement, or constipation.  Genito Urinary  No frequency, urgency, dysuria, no bleeding Musculo  Skeletal  No myalgia, arthralgia, joint swelling or pain  Neurologic  No weakness, numbness, change in gait,  Psychology  No depression, anxiety, insomnia.   Vitals:  Blood pressure 136/87, pulse 82, temperature (!) 97.4 F (36.3 C), temperature source Tympanic, resp. rate 17, height 5\' 2"  (1.575 m), weight 175 lb 14.4 oz (79.8 kg), SpO2 100 %.  Physical Exam: WD in NAD Neck  Supple NROM, without any enlargements.  Lymph Node Survey No cervical supraclavicular or inguinal adenopathy Cardiovascular  Pulse normal rate, regularity and rhythm. S1 and S2 normal.  Lungs  Clear to auscultation bilateraly, without wheezes/crackles/rhonchi. Good air movement.  Skin  No rash/lesions/breakdown  Psychiatry  Alert and oriented to person, place, and time  Abdomen  Normoactive bowel sounds, abdomen soft, non-tender and nonobese. Right paramedian incisional hernia - no incarceration or tenderness.  Back No CVA tenderness Genito Urinary: vaginal cuff in tact, no lesions. No masses. She has posterior vaginal wall prolapse (rectocele). Rectal  deferred  Extremities  No bilateral cyanosis, clubbing or edema.   Thereasa Solo, MD  05/26/2020, 3:09 PM

## 2020-06-09 ENCOUNTER — Other Ambulatory Visit: Payer: Self-pay

## 2020-06-09 ENCOUNTER — Ambulatory Visit
Admission: RE | Admit: 2020-06-09 | Discharge: 2020-06-09 | Disposition: A | Discharge status: Home / Self Care | Payer: Medicare Other | Source: Ambulatory Visit | Attending: Family Medicine | Admitting: Family Medicine

## 2020-06-09 DIAGNOSIS — Z1231 Encounter for screening mammogram for malignant neoplasm of breast: Secondary | ICD-10-CM

## 2020-06-18 DIAGNOSIS — Z1152 Encounter for screening for COVID-19: Secondary | ICD-10-CM | POA: Diagnosis not present

## 2020-07-04 ENCOUNTER — Ambulatory Visit: Payer: 59

## 2020-10-05 ENCOUNTER — Encounter: Payer: Self-pay | Admitting: Gynecologic Oncology

## 2020-10-06 NOTE — Telephone Encounter (Signed)
Told Ms Stuckert that Dr. Denman George said that it was fine to take the Multi Collagen Protein Supplement. Pt verbalized understanding.

## 2020-11-14 ENCOUNTER — Other Ambulatory Visit: Payer: Self-pay

## 2020-11-14 ENCOUNTER — Inpatient Hospital Stay: Payer: Medicare Other | Attending: Gynecologic Oncology

## 2020-11-14 DIAGNOSIS — Z8543 Personal history of malignant neoplasm of ovary: Secondary | ICD-10-CM | POA: Diagnosis not present

## 2020-11-14 DIAGNOSIS — C561 Malignant neoplasm of right ovary: Secondary | ICD-10-CM

## 2020-11-14 LAB — CEA (IN HOUSE-CHCC): CEA (CHCC-In House): 1.33 ng/mL (ref 0.00–5.00)

## 2020-11-15 LAB — CA 125: Cancer Antigen (CA) 125: 10.7 U/mL (ref 0.0–38.1)

## 2020-11-21 ENCOUNTER — Telehealth: Payer: Self-pay

## 2020-11-21 ENCOUNTER — Ambulatory Visit: Payer: Medicare Other | Admitting: Gynecologic Oncology

## 2020-11-21 NOTE — Telephone Encounter (Signed)
Received call from Deon, she will be unable to make her appointment today and has rescheduled for July 13th. Patient in agreement of new date and time.

## 2020-12-01 ENCOUNTER — Encounter: Payer: Self-pay | Admitting: Gynecologic Oncology

## 2020-12-02 ENCOUNTER — Telehealth: Payer: Self-pay | Admitting: *Deleted

## 2020-12-02 NOTE — Telephone Encounter (Signed)
Per Lenna Sciara APP fax [employer] form to [facility]

## 2020-12-14 ENCOUNTER — Other Ambulatory Visit: Payer: Self-pay

## 2020-12-14 ENCOUNTER — Encounter: Payer: Self-pay | Admitting: Gynecologic Oncology

## 2020-12-14 ENCOUNTER — Inpatient Hospital Stay: Payer: Medicare Other | Attending: Gynecologic Oncology | Admitting: Gynecologic Oncology

## 2020-12-14 VITALS — BP 150/84 | HR 74 | Temp 98.2°F | Resp 18 | Ht 62.0 in | Wt 180.3 lb

## 2020-12-14 DIAGNOSIS — Z8543 Personal history of malignant neoplasm of ovary: Secondary | ICD-10-CM | POA: Diagnosis not present

## 2020-12-14 DIAGNOSIS — K219 Gastro-esophageal reflux disease without esophagitis: Secondary | ICD-10-CM | POA: Insufficient documentation

## 2020-12-14 DIAGNOSIS — Z87891 Personal history of nicotine dependence: Secondary | ICD-10-CM | POA: Insufficient documentation

## 2020-12-14 DIAGNOSIS — Z79899 Other long term (current) drug therapy: Secondary | ICD-10-CM | POA: Insufficient documentation

## 2020-12-14 DIAGNOSIS — Z90710 Acquired absence of both cervix and uterus: Secondary | ICD-10-CM | POA: Insufficient documentation

## 2020-12-14 DIAGNOSIS — N816 Rectocele: Secondary | ICD-10-CM | POA: Insufficient documentation

## 2020-12-14 DIAGNOSIS — C561 Malignant neoplasm of right ovary: Secondary | ICD-10-CM

## 2020-12-14 DIAGNOSIS — Z79890 Hormone replacement therapy (postmenopausal): Secondary | ICD-10-CM | POA: Insufficient documentation

## 2020-12-14 DIAGNOSIS — E785 Hyperlipidemia, unspecified: Secondary | ICD-10-CM | POA: Insufficient documentation

## 2020-12-14 DIAGNOSIS — Z90722 Acquired absence of ovaries, bilateral: Secondary | ICD-10-CM | POA: Diagnosis not present

## 2020-12-14 DIAGNOSIS — K439 Ventral hernia without obstruction or gangrene: Secondary | ICD-10-CM | POA: Diagnosis not present

## 2020-12-14 NOTE — Patient Instructions (Signed)
Dr Denman George is recommending annual checks of your CA 125 and CEA tumor marker.  This can be scheduled with your primary care doctor or with [name] at the [facility].

## 2020-12-14 NOTE — Progress Notes (Signed)
Follow-up Note: Gyn-Onc  Consult was requested by Dr. Marisue Humble and Dr Laurann Montana for the evaluation of Theresa Frazier 65 y.o. female  CC:  Chief Complaint  Patient presents with   Ovarian cancer on right [facility])    Assessment/Plan:  [name]  is a 65 y.o.  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 No measurable disease.   I discussed the risk for recurrence associated with this disease. I discussed symptoms consistent with recurrence and for her to notify me of these should they occur.  I recommend ongoing evaluations with CEA and CA 125 annually.  She will have this scheduled with her PCP or with [name], NP at the [facility] as I am leaving the practice in October.   Rectocele and vaginal dryness - treated with premarin. S/p consult with Dr Zigmund Daniel.   Ventral hernia - seeing Dr Grandville Silos for this.   HPI: Theresa Frazier is a 65 year old parous (P2) woman who was seen in consultation at the request of Dr Marisue Humble and Dr Laurann Montana for a complex cystic and solid pelvic mass.  The patient reported to her PCP's that she had abdominal bloating and fullness for 6-8 weeks since October 2017 when she saw them on 04/25/16. They performed a pelvic examination on that visit and appreciated a pelvic mass. A TVUS was ordered and performed on 05/04/16 and revealed a uterus measuring 8.4x3.7x4.8cm with a 7cm endometrial stripe (she has had no bleeding symptoms) and the ovaries were not discretely seen. Instead, there was a large complex cystic and solid mass at midline, extending into the right and left adnexum with internal blood flow. The mass measures 11 x 7.4 x 14.1 cm. Separate ovaries are not identified.  CT scan on 05/16/16 showed: large (15cm) complex mass in the central anatomic pelvis appears to arise from the right ovary and was highly worrisome for a cystic ovarian carcinoma and  a small amount of pelvic free fluid. CA 125 on 05/14/16 was elevated at 72.  On 05/31/16 she went to the OR for an exploratory laparotomy, TAH, BSO, omentectomy and peritoneal biopsies (no lymphadenectomy due to frozen section showing mucinous cell type).Intraoperative findings were significant for a 15cm right ovarian mass non-adherent, and otherwise normal peritoneal cavity and appendix. Frozen showed mucinous adencarcinoma.  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  CA 125 on 11/15/16 was normal at 11.9 CEA on 11/15/16 was normal at <1  CA 125 on 02/18/17 was normal at 14 CEA on 02/18/17 was normal at 1  CA 125 on 05/24/17 was normal at 11.6 CEA on 05/24/17 was normal at <1  CA 125 on 07/29/17 was normal at 13.3 CEA on 07/29/17 was normal at <1  CA 125 on 11/07/17 was normal at 12.4 CEA on 11/07/17 was normal at <1.   CT abd/pelvis and chest on 08/16/17 showed no evidence of recurrence and stable pulm and peritoneal nodules.   CA 125 on 02/24/18 was normal at 10 CEA on 02/24/18 was normal at <1.   CA 125 on 05/26/18 was normal at 10 CEA on 05/26/18 was normal at 1.31  CA 125 on 05/19/19 was normal at 9.3 CEA on 05/19/19 was normal at <1   CA 125 on 6/1/21was normal at 10.8 CEA on 11/03/19 was normal at <1  CA 125 on 05/23/20 was normal at 12.8 CEA on 05/23/20 was normal at <1   Interval Hx: CA 125 on 11/14/20 was normal at 10.7 CEA on 11/14/20 was normal at 1.3.  Current Meds:  Outpatient Encounter Medications as of 12/14/2020  Medication Sig   fluticasone (FLONASE) 50 MCG/ACT nasal spray Place 1 spray into both nostrils daily. (Patient taking differently: Place 1 spray into both nostrils daily as needed.)   hydroxypropyl methylcellulose / hypromellose (ISOPTO TEARS / GONIOVISC) 2.5 % ophthalmic solution Place 1-2 drops into both eyes 3 (three) times daily as needed for dry eyes.    pseudoephedrine (SUDAFED) 30 MG tablet Take 30 mg by mouth as needed for congestion.   No facility-administered encounter medications on file as of 12/14/2020.    Allergy: No Known Allergies  Social Hx:   Social History   Socioeconomic History   Marital status: Married    Spouse name: Not on file   Number of children: Not on file   Years of education: Not on file   Highest education level: Not on file  Occupational History   Not on file  Tobacco Use   Smoking status: Former    Years: 7.00    Pack years: 0.00    Types: Cigarettes    Quit date: 11/07/1985    Years since quitting: 35.1   Smokeless tobacco: Never  Vaping Use   Vaping Use: Never used  Substance and Sexual Activity   Alcohol use: No   Drug use: No   Sexual activity: Yes  Other Topics Concern   Not on file  Social History Narrative   Not on file   Social Determinants of Health   Financial Resource Strain: Not on file  Food Insecurity: Not on file  Transportation Needs: Not on file  Physical Activity: Not on file  Stress: Not on file  Social Connections: Not on file  Intimate Partner Violence: Not on file    Past Surgical Hx:  Past Surgical History:  Procedure Laterality Date   ABDOMINAL HYSTERECTOMY Left 05/31/2016   Procedure: HYSTERECTOMY ABDOMINAL;  Surgeon: Everitt Amber, MD;  Location: [facility];  Service: Gynecology;  Laterality: Left;   BREAST EXCISIONAL BIOPSY Right    BREAST SURGERY  1993   left breast-calcification   COLONOSCOPY     LAPAROTOMY N/A 05/31/2016   Procedure: EXPLORATORY LAPAROTOMY;  Surgeon: Everitt Amber, MD;  Location: [facility];  Service: Gynecology;  Laterality: N/A;   OMENTECTOMY  05/31/2016   Procedure: OMENTECTOMY AND STAGING FOR OVARIAN CANCER;  Surgeon: Everitt Amber, MD;  Location: [facility];  Service: Gynecology;;   SALPINGOOPHORECTOMY Right 05/31/2016   Procedure: BILATERAL SALPINGO OOPHORECTOMY;  Surgeon: Everitt Amber, MD;  Location: [facility];  Service: Gynecology;  Laterality: Right;    TONSILLECTOMY      Past Medical Hx:  Past Medical History:  Diagnosis Date   Chest pain    atypical, myoview 05/31/05-EF 75%, low risk   Dyslipidemia    Family history of early CAD    Genetic testing 09/28/2016   Genetic testing revealed ONE pathogenic mutation in [place]. Though a pathogenic mutation was identified, increased risk for cancer is only associated with TWO (biallelic) pathogenic mutations. No additional mutations or variants of uncertain significance were identified. Testing was performed through [employer]'s 46-gene Common Hereditary Cancers Panel. [employer]'s Common Hereditary Cancers Panel includ   GERD (gastroesophageal reflux disease)     Past Gynecological History:  SVD x 2 No LMP recorded. Patient has had a hysterectomy.  Family Hx:  Family History  Problem Relation Age of Onset   Pancreatic cancer Mother 80       d.73 - two months after diagnosis   Depression Mother    Diabetes Mother        at end of life   Hearing loss Father    Heart attack Father 3   Heart disease Father    Addison's disease Maternal Aunt 1   Leukemia Maternal Uncle 59       d.59 - shortly after diagnosis   Alcohol abuse Maternal Grandmother    Drug abuse Maternal Grandfather    Heart attack Paternal Grandfather    Melanoma Daughter 46   Hypertension Sister    Alcohol abuse Brother    Diabetes Brother    Arthritis Brother    Arthritis Sister    Mental illness Sister        closed head injury as teen   Alcohol abuse Sister     Review of Systems:  Constitutional  Feels well,    ENT Normal appearing ears and nares bilaterally Skin/Breast  No rash, sores, jaundice, itching, dryness Cardiovascular  No chest pain, shortness of breath, or edema  Pulmonary  No cough or wheeze.  Gastro Intestinal  No nausea, vomitting, or diarrhoea. No bright red blood per rectum, no abdominal pain, change in bowel movement, or constipation.  Genito Urinary  No frequency, urgency, dysuria, no  bleeding Musculo Skeletal  No myalgia, arthralgia, joint swelling or pain  Neurologic  No weakness, numbness, change in gait,  Psychology  No depression, anxiety, insomnia.   Vitals:  Blood pressure (!) 150/84, pulse 74, temperature 98.2 F (36.8 C), temperature source Tympanic, resp. rate 18, height 5\' 2"  (1.575 m), weight 180 lb 4.8 oz (81.8 kg), SpO2 99 %.  Physical Exam: WD in NAD Neck  Supple NROM, without any enlargements.  Lymph Node Survey No cervical supraclavicular or inguinal adenopathy Cardiovascular  Pulse normal rate, regularity and rhythm. S1 and S2 normal.  Lungs  Clear to auscultation bilateraly, without wheezes/crackles/rhonchi. Good air movement.  Skin  No rash/lesions/breakdown  Psychiatry  Alert and oriented to person, place, and time  Abdomen  Normoactive bowel sounds, abdomen soft, non-tender and nonobese. Right paramedian incisional hernia - no incarceration or tenderness.  Back No CVA tenderness Genito Urinary: vaginal cuff in tact, no lesions. No masses. She has posterior vaginal wall prolapse (rectocele). Rectal  deferred  Extremities  No bilateral cyanosis, clubbing or edema.   Thereasa Solo, MD  12/14/2020, 4:18 PM

## 2020-12-26 DIAGNOSIS — L814 Other melanin hyperpigmentation: Secondary | ICD-10-CM | POA: Diagnosis not present

## 2020-12-26 DIAGNOSIS — D1801 Hemangioma of skin and subcutaneous tissue: Secondary | ICD-10-CM | POA: Diagnosis not present

## 2020-12-26 DIAGNOSIS — D225 Melanocytic nevi of trunk: Secondary | ICD-10-CM | POA: Diagnosis not present

## 2020-12-26 DIAGNOSIS — L570 Actinic keratosis: Secondary | ICD-10-CM | POA: Diagnosis not present

## 2021-01-02 ENCOUNTER — Encounter: Payer: Self-pay | Admitting: Gynecologic Oncology

## 2021-01-03 NOTE — Telephone Encounter (Signed)
Told Ms Eiland that the [employer] Medical Clearance form was re faxed today 01-03-21. Ms Delsa Grana will f/u with Coordinator Dion Saucier.

## 2021-01-18 ENCOUNTER — Encounter: Payer: Self-pay | Admitting: Gynecologic Oncology

## 2021-01-19 ENCOUNTER — Telehealth: Payer: Self-pay | Admitting: Gastroenterology

## 2021-01-19 ENCOUNTER — Encounter: Payer: Self-pay | Admitting: Gynecologic Oncology

## 2021-01-19 DIAGNOSIS — L720 Epidermal cyst: Secondary | ICD-10-CM | POA: Diagnosis not present

## 2021-01-19 DIAGNOSIS — L570 Actinic keratosis: Secondary | ICD-10-CM | POA: Diagnosis not present

## 2021-01-19 NOTE — Telephone Encounter (Signed)
Pt called stating that she will drop off her GI records from Dr. Earlean Shawl for Dr. Tarri Glenn review. She stated that she met Dr. Tarri Glenn when she came accompanying a friend and was informed that she is accepting new pts. Pt wants to have repeat colon. She had ovarian cancer in 2017 so wants to be proactive with colon surveillance.

## 2021-01-24 NOTE — Telephone Encounter (Signed)
I will look for those records to review. Thanks.  

## 2021-01-24 NOTE — Telephone Encounter (Signed)
Hi Dr. Tarri Glenn, this pt dropped off her Gi records for your review. She stated that she had the pleasure to meet you when she accompanied her friend to her procedure. She would like to become your patient and stated that she had ovarian cancer in 2017 so would like to repeat her colonoscopy before her recall date if possible. Records will be sent to you for review. Please advise on scheduling. Thank you.

## 2021-01-31 NOTE — Telephone Encounter (Signed)
Okay to schedule colonoscopy with me in 2022.

## 2021-01-31 NOTE — Telephone Encounter (Signed)
Initial screening colonoscopy with Dr. Earlean Shawl 11/15/2006 was normal.  Repeat colonoscopy recommended in 5 to 10 years. Surveillance colonoscopy 12/19/2015 was performed to the cecum.  The prep quality excellent.  Findings were normal.  Repeat colonoscopy recommended in 10 years.  Mother had IBS and pancreatic cancer

## 2021-02-02 ENCOUNTER — Encounter: Payer: Self-pay | Admitting: Gastroenterology

## 2021-02-02 NOTE — Telephone Encounter (Signed)
Colon sch on 03/14/21 at 2:30pm at [facility].

## 2021-02-18 ENCOUNTER — Encounter (HOSPITAL_COMMUNITY): Payer: Self-pay | Admitting: Urgent Care

## 2021-02-18 ENCOUNTER — Other Ambulatory Visit: Payer: Self-pay

## 2021-02-18 ENCOUNTER — Ambulatory Visit (HOSPITAL_COMMUNITY)
Admission: EM | Admit: 2021-02-18 | Discharge: 2021-02-18 | Disposition: A | Discharge status: Home / Self Care | Payer: Medicare Other | Attending: Internal Medicine | Admitting: Internal Medicine

## 2021-02-18 ENCOUNTER — Ambulatory Visit (INDEPENDENT_AMBULATORY_CARE_PROVIDER_SITE_OTHER): Payer: Medicare Other

## 2021-02-18 DIAGNOSIS — R059 Cough, unspecified: Secondary | ICD-10-CM | POA: Insufficient documentation

## 2021-02-18 DIAGNOSIS — R52 Pain, unspecified: Secondary | ICD-10-CM

## 2021-02-18 DIAGNOSIS — R9389 Abnormal findings on diagnostic imaging of other specified body structures: Secondary | ICD-10-CM | POA: Diagnosis not present

## 2021-02-18 DIAGNOSIS — R509 Fever, unspecified: Secondary | ICD-10-CM | POA: Diagnosis not present

## 2021-02-18 DIAGNOSIS — U071 COVID-19: Secondary | ICD-10-CM | POA: Insufficient documentation

## 2021-02-18 LAB — BASIC METABOLIC PANEL
Anion gap: 9 (ref 5–15)
BUN: 9 mg/dL (ref 8–23)
CO2: 23 mmol/L (ref 22–32)
Calcium: 8.8 mg/dL — ABNORMAL LOW (ref 8.9–10.3)
Chloride: 104 mmol/L (ref 98–111)
Creatinine, Ser: 0.97 mg/dL (ref 0.44–1.00)
GFR, Estimated: >60 mL/min (ref >60)
Glucose, Bld: 144 mg/dL — ABNORMAL HIGH (ref 70–99)
Potassium: 3.4 mmol/L — ABNORMAL LOW (ref 3.5–5.1)
Sodium: 136 mmol/L (ref 135–145)

## 2021-02-18 MED ORDER — CETIRIZINE HCL 10 MG PO TABS: 10.0000 mg | ORAL_TABLET | Freq: Every day | ORAL | 0 refills | Status: DC

## 2021-02-18 MED ORDER — BENZONATATE 100 MG PO CAPS: 100.0000 mg | ORAL_CAPSULE | Freq: Three times a day (TID) | ORAL | 0 refills | Status: DC | PRN

## 2021-02-18 MED ORDER — PSEUDOEPHEDRINE HCL 60 MG PO TABS: 30.0000 mg | ORAL_TABLET | Freq: Three times a day (TID) | ORAL | 0 refills | Status: DC | PRN

## 2021-02-18 MED ORDER — ACETAMINOPHEN 325 MG PO TABS
650.0000 mg | ORAL_TABLET | Freq: Once | ORAL | Status: AC
  Administered 2021-02-18: 650 mg via ORAL

## 2021-02-18 MED ORDER — PROMETHAZINE-DM 6.25-15 MG/5ML PO SYRP: 5.0000 mL | ORAL_SOLUTION | Freq: Every evening | ORAL | 0 refills | Status: DC | PRN

## 2021-02-18 MED ORDER — ACETAMINOPHEN 325 MG PO TABS
ORAL_TABLET | ORAL | Status: AC
  Filled 2021-02-18: qty 2

## 2021-02-18 NOTE — ED Triage Notes (Signed)
Pt had a positive home test.

## 2021-02-18 NOTE — ED Triage Notes (Signed)
Pt reports generalized body aches and weakness. Pt reported temp 102 at home but did not take any OTC for fever.

## 2021-02-18 NOTE — ED Provider Notes (Signed)
[place]   MRN: [id number] DOB: Apr 01, 1956  Subjective:   Theresa Frazier is a 65 y.o. female presenting for 1 day history of acute onset fever, cough that elicits chest pain, malaise and fatigue, weakness. Had an at home positive COVID test. No history of CKD but no recent labs from this year either. Patient is not a smoker. No history of respiratory disorders.  She does have COVID vaccination and booster.  No active chest pain, shortness of breath, wheezing.  No current facility-administered medications for this encounter.  Current Outpatient Medications:    fluticasone (FLONASE) 50 MCG/ACT nasal spray, Place 1 spray into both nostrils daily. (Patient taking differently: Place 1 spray into both nostrils daily as needed.), Disp: 16 g, Rfl: 2   hydroxypropyl methylcellulose / hypromellose (ISOPTO TEARS / GONIOVISC) 2.5 % ophthalmic solution, Place 1-2 drops into both eyes 3 (three) times daily as needed for dry eyes., Disp: , Rfl:    pseudoephedrine (SUDAFED) 30 MG tablet, Take 30 mg by mouth as needed for congestion., Disp: , Rfl:    No Known Allergies  Past Medical History:  Diagnosis Date   Chest pain    atypical, myoview 05/31/05-EF 75%, low risk   Dyslipidemia    Family history of early CAD    Genetic testing 09/28/2016   Genetic testing revealed ONE pathogenic mutation in [place]. Though a pathogenic mutation was identified, increased risk for cancer is only associated with TWO (biallelic) pathogenic mutations. No additional mutations or variants of uncertain significance were identified. Testing was performed through [facility]'s 46-gene Common Hereditary Cancers Panel. [facility]'s Common Hereditary Cancers Panel includ   GERD (gastroesophageal reflux disease)      Past Surgical History:  Procedure Laterality Date   ABDOMINAL HYSTERECTOMY Left 05/31/2016   Procedure: HYSTERECTOMY ABDOMINAL;  Surgeon: Everitt Amber, MD;  Location: [facility];  Service: Gynecology;   Laterality: Left;   BREAST EXCISIONAL BIOPSY Right    BREAST SURGERY  1993   left breast-calcification   COLONOSCOPY     LAPAROTOMY N/A 05/31/2016   Procedure: EXPLORATORY LAPAROTOMY;  Surgeon: Everitt Amber, MD;  Location: [facility];  Service: Gynecology;  Laterality: N/A;   OMENTECTOMY  05/31/2016   Procedure: OMENTECTOMY AND STAGING FOR OVARIAN CANCER;  Surgeon: Everitt Amber, MD;  Location: [facility];  Service: Gynecology;;   SALPINGOOPHORECTOMY Right 05/31/2016   Procedure: BILATERAL SALPINGO OOPHORECTOMY;  Surgeon: Everitt Amber, MD;  Location: [facility];  Service: Gynecology;  Laterality: Right;   TONSILLECTOMY      Family History  Problem Relation Age of Onset   Pancreatic cancer Mother 28       d.73 - two months after diagnosis   Depression Mother    Diabetes Mother        at end of life   Hearing loss Father    Heart attack Father 47   Heart disease Father    Addison's disease Maternal Aunt 40   Leukemia Maternal Uncle 68       d.59 - shortly after diagnosis   Alcohol abuse Maternal Grandmother    Drug abuse Maternal Grandfather    Heart attack Paternal Grandfather    Melanoma Daughter 70   Hypertension Sister    Alcohol abuse Brother    Diabetes Brother    Arthritis Brother    Arthritis Sister    Mental illness Sister        closed head injury as teen   Alcohol abuse Sister  Social History   Tobacco Use   Smoking status: Former    Years: 7.00    Types: Cigarettes    Quit date: 11/07/1985    Years since quitting: 35.3   Smokeless tobacco: Never  Vaping Use   Vaping Use: Never used  Substance Use Topics   Alcohol use: No   Drug use: No    ROS   Objective:   Vitals: BP 109/74   Pulse (!) 111   Temp (!) 101.4 F (38.6 C)   Resp 20   SpO2 94%   Physical Exam Constitutional:      General: She is not in acute distress.    Appearance: Normal appearance. She is well-developed. She is not ill-appearing, toxic-appearing or diaphoretic.  HENT:     Head:  Normocephalic and atraumatic.     Right Ear: External ear normal.     Left Ear: External ear normal.     Nose: Nose normal.     Mouth/Throat:     Mouth: Mucous membranes are moist.  Eyes:     General: No scleral icterus.       Right eye: No discharge.        Left eye: No discharge.     Extraocular Movements: Extraocular movements intact.     Conjunctiva/sclera: Conjunctivae normal.     Pupils: Pupils are equal, round, and reactive to light.  Cardiovascular:     Rate and Rhythm: Normal rate and regular rhythm.     Pulses: Normal pulses.     Heart sounds: Normal heart sounds. No murmur heard.   No friction rub. No gallop.  Pulmonary:     Effort: Pulmonary effort is normal. No respiratory distress.     Breath sounds: No stridor. No wheezing, rhonchi or rales.     Comments: Slight decreased lung sounds in bibasilar fields.  Skin:    General: Skin is warm and dry.     Findings: No rash.  Neurological:     Mental Status: She is alert and oriented to person, place, and time.  Psychiatric:        Mood and Affect: Mood normal.        Behavior: Behavior normal.        Thought Content: Thought content normal.        Judgment: Judgment normal.   Patient given Tylenol in clinic for fever.  DG Chest 2 View  Result Date: 02/18/2021 CLINICAL DATA:  Cough.  Generalized body aches. EXAM: CHEST - 2 VIEW COMPARISON:  July 17, 2018 FINDINGS: Mild opacity in left base. The heart, hila, mediastinum, lungs, and pleura are otherwise unremarkable. IMPRESSION: Mild opacity in left base could represent mild infiltrate given history. Recommend short-term follow-up imaging to ensure resolution. Electronically Signed   By: Dorise Bullion III M.D.   On: 02/18/2021 17:21    Assessment and Plan :   PDMP not reviewed this encounter.  1. Clinical diagnosis of COVID-19   2. Fever, unspecified   3. Body aches   4. Cough     Labs pending, will consider Paxlovid for COVID antiviral. Otherwise,  recommended supportive care. No signs of an emergent process.  However, given her x-ray recommend close follow-up and consideration for repeat x-ray should her cough progress and she develop chest pain, shortness of breath.  Otherwise, we will hold off on antibiotics given her at home positive test for COVID-19.  Counseled patient on potential for adverse effects with medications prescribed/recommended today, ER and return-to-clinic precautions discussed,  patient verbalized understanding.    Jaynee Eagles, PA-C 02/18/21 1733

## 2021-02-18 NOTE — Discharge Instructions (Addendum)
Collection about your COVID test results tomorrow.  We will also let you know if you need to start a COVID antiviral in which one to use.  Your chest x-ray did show a small spot of pneumonia and therefore I highly recommend that if your cough progresses, you develop chest pain or shortness of breath that you return for a recheck.  For sore throat or cough try using a honey-based tea. Use 3 teaspoons of honey with juice squeezed from half lemon. Place shaved pieces of ginger into 1/2-1 cup of water and warm over stove top. Then mix the ingredients and repeat every 4 hours as needed. Please take Tylenol 500mg -650mg  every 6 hours for aches and pains, fevers. Hydrate very well with at least 2 liters of water. Eat light meals such as soups to replenish electrolytes and soft fruits, veggies. Start an antihistamine like Zyrtec, Allegra or Claritin for postnasal drainage, sinus congestion.  You can take this together with pseudoephedrine (Sudafed) at a dose of 30 mg 2-3 times a day as needed for the same kind of congestion.

## 2021-02-19 ENCOUNTER — Telehealth: Payer: Self-pay | Admitting: Urgent Care

## 2021-02-19 ENCOUNTER — Telehealth (HOSPITAL_COMMUNITY): Payer: Self-pay | Admitting: *Deleted

## 2021-02-19 LAB — SARS CORONAVIRUS 2 (TAT 6-24 HRS): SARS Coronavirus 2: POSITIVE — AB

## 2021-02-19 MED ORDER — PAXLOVID (300/100) 20 X 150 MG & 10 X 100MG PO TBPK: 1.0000 | ORAL_TABLET | Freq: Two times a day (BID) | ORAL | 0 refills | Status: DC

## 2021-02-19 NOTE — Telephone Encounter (Signed)
Pt requesting anti-viral for Covid +.  Pt informed of Paxlovid Rx being sent to her pharmacy.  Pt verbalized understanding.

## 2021-02-19 NOTE — Telephone Encounter (Signed)
Recommended Paxlovid for her COVID 19 infection. Renal function is normal.    Results for orders placed or performed during the hospital encounter of 02/18/21 (from the past 24 hour(s))  Basic metabolic panel     Status: Abnormal   Collection Time: 02/18/21  4:46 PM  Result Value Ref Range   Sodium 136 135 - 145 mmol/L   Potassium 3.4 (L) 3.5 - 5.1 mmol/L   Chloride 104 98 - 111 mmol/L   CO2 23 22 - 32 mmol/L   Glucose, Bld 144 (H) 70 - 99 mg/dL   BUN 9 8 - 23 mg/dL   Creatinine, Ser 0.97 0.44 - 1.00 mg/dL   Calcium 8.8 (L) 8.9 - 10.3 mg/dL   GFR, Estimated >60 >60 mL/min   Anion gap 9 5 - 15  SARS CORONAVIRUS 2 (TAT 6-24 HRS) Nasopharyngeal Nasopharyngeal Swab     Status: Abnormal   Collection Time: 02/18/21  5:36 PM   Specimen: Nasopharyngeal Swab  Result Value Ref Range   SARS Coronavirus 2 POSITIVE (A) NEGATIVE

## 2021-02-19 NOTE — Telephone Encounter (Signed)
Spoke with the pharmacy and corrected the prescription that I sent to the full Paxlovid dose.

## 2021-03-06 ENCOUNTER — Telehealth: Payer: Self-pay | Admitting: Family Medicine

## 2021-03-06 NOTE — Telephone Encounter (Signed)
Spoke with the patient and she stated she tested positive for Covid again, is now at day 5 and wanted to discuss Paxlovid.  Informed the patient a visit is needed for evaluation and an appt was scheduled with Dr Maudie Mercury for 10/4.  Patient was advised to go to the ER for treatment if she develops any shortness of breath, other breathing problems or worsening of symptoms.  Patient stated she wanted to let someone know she called the office on Friday when we were closed due to inclement weather and is very pleased with the care and attention she has received from that time until now.  Patient was informed of my appreciation for this and she is aware a message was sent to [facility], Engineer, building services.

## 2021-03-06 NOTE — Telephone Encounter (Signed)
Patient is requesting to speak with someone regarding covid.  Patient could be contacted at [phone].  Please advise.

## 2021-03-07 ENCOUNTER — Telehealth (INDEPENDENT_AMBULATORY_CARE_PROVIDER_SITE_OTHER): Payer: Medicare Other | Admitting: Family Medicine

## 2021-03-07 ENCOUNTER — Encounter: Payer: Self-pay | Admitting: Family Medicine

## 2021-03-07 VITALS — Temp 97.7°F

## 2021-03-07 DIAGNOSIS — U071 COVID-19: Secondary | ICD-10-CM | POA: Diagnosis not present

## 2021-03-07 NOTE — Progress Notes (Signed)
Virtual Visit via Video Note  I connected with Theresa Frazier  on 03/07/21 at 11:00 AM EDT by a video enabled telemedicine application and verified that I am speaking with the correct person using two identifiers.  Location patient: home, [place] Location provider:work or home office Persons participating in the virtual visit: patient, provider  I discussed the limitations of evaluation and management by telemedicine and the availability of in person appointments. The patient expressed understanding and agreed to proceed.   HPI:  Acute telemedicine visit for Cough: -Onset: Covid dx on Sept 17th - was started on Paxlovid on the 18th and finished it on the 23rd, felt better on the 26th, but then started to feel sick again on the 27-28th -Symptoms include: starting on the 27th and 28th nasal congestion, tickle in the throat causing cough and tested positive for covid again -Denies:fever, CP, SOB, inability to eat/drink/get out of bed -Pertinent past medical history: see below -Pertinent medication allergies: No Known Allergies -COVID-19 vaccine status: vaccinated x2 and had one booster   ROS: See pertinent positives and negatives per HPI.  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 Though a pathogenic mutation was identified, increased risk for cancer is only associated with TWO (biallelic) pathogenic mutations. No additional mutations or variants of uncertain significance were identified. Testing was performed through [facility]'s 46-gene Common Hereditary Cancers Panel. [facility]'s Common Hereditary Cancers Panel includ   GERD (gastroesophageal reflux disease)     Past Surgical History:  Procedure Laterality Date   ABDOMINAL HYSTERECTOMY Left 05/31/2016   Procedure: HYSTERECTOMY ABDOMINAL;  Surgeon: Everitt Amber,  MD;  Location: [facility];  Service: Gynecology;  Laterality: Left;   BREAST EXCISIONAL BIOPSY Right    BREAST SURGERY  1993   left breast-calcification   COLONOSCOPY     LAPAROTOMY N/A 05/31/2016   Procedure: EXPLORATORY LAPAROTOMY;  Surgeon: Everitt Amber, MD;  Location: [facility];  Service: Gynecology;  Laterality: N/A;   OMENTECTOMY  05/31/2016   Procedure: OMENTECTOMY AND STAGING FOR OVARIAN CANCER;  Surgeon: Everitt Amber, MD;  Location: [facility];  Service: Gynecology;;   SALPINGOOPHORECTOMY Right 05/31/2016   Procedure: BILATERAL SALPINGO OOPHORECTOMY;  Surgeon: Everitt Amber, MD;  Location: [facility];  Service: Gynecology;  Laterality: Right;   TONSILLECTOMY       Current Outpatient Medications:    benzonatate (TESSALON) 100 MG capsule, Take 1-2 capsules (100-200 mg total) by mouth 3 (three) times daily as needed., Disp: 60 capsule, Rfl: 0   cetirizine (ZYRTEC ALLERGY) 10 MG tablet, Take 1 tablet (10 mg total) by mouth daily., Disp: 30 tablet, Rfl: 0   fluticasone (FLONASE) 50 MCG/ACT nasal spray, Place 1 spray into both nostrils daily. (Patient taking differently: Place 1 spray into both nostrils daily as needed.), Disp: 16 g, Rfl: 2   hydroxypropyl methylcellulose / hypromellose (ISOPTO TEARS / GONIOVISC) 2.5 % ophthalmic solution, Place 1-2 drops into both eyes 3 (three) times daily as needed for dry eyes., Disp: , Rfl:   EXAM:  VITALS per patient if applicable:  GENERAL: alert, oriented, appears well and in no acute distress  HEENT: atraumatic, conjunttiva clear, no obvious abnormalities on inspection of external nose and ears  NECK: normal movements of the head and neck  LUNGS: on inspection no signs of respiratory distress, breathing rate  appears normal, no obvious gross SOB, gasping or wheezing  CV: no obvious cyanosis  MS: moves all visible extremities without noticeable abnormality  PSYCH/NEURO: pleasant and cooperative, no obvious depression or anxiety, speech and thought processing  grossly intact  ASSESSMENT AND PLAN:  Discussed the following assessment and plan:  COVID-19  -we discussed possible serious and likely etiologies, options for evaluation and workup, limitations of telemedicine visit vs in person visit, treatment, treatment risks and precautions. Pt is agreeable to treatment via telemedicine at this moment. Query intermittent prolonged covid or rebound (more likely). Currently she is reporting mild/upper resp symptoms. Discussed re-isolation and symptomatic care with nasal saline, short course of nasal decongestant and cough suppressant if needed. She has tessalon from the [facility] which she feels is sufficient. Discussed precautions and also advised follow up visit with PCP in 4-6 weeks for repeat CXR per recs from xray report from [facility] - she reports she will schedule this as had to do CPE anywat.   Advised to seek prompt in person care if worsening, new symptoms arise, or if is not improving with treatment. Discussed options for inperson care if PCP office not available. Did let this patient know that I only do telemedicine on Tuesdays and Thursdays for [place]. Advised to schedule follow up visit with PCP or [facility] if any further questions or concerns to avoid delays in care.   I discussed the assessment and treatment plan with the patient. The patient was provided an opportunity to ask questions and all were answered. The patient agreed with the plan and demonstrated an understanding of the instructions.     Lucretia Kern, DO

## 2021-03-07 NOTE — Patient Instructions (Signed)
-  nasal saline twice daily  -can do a short course of Afrin nasal decongestant for 3 days  -stay hydrated - avoid dairy  -schedule a follow up with your primary doctor in 4-6 weeks for a repeat Chest Xray  I hope you are feeling better soon!  Seek in person care promptly if your symptoms worsen, new concerns arise or you are not improving with treatment.  It was nice to meet you today. I help [place] out with telemedicine visits on Tuesdays and Thursdays and am available for visits on those days. If you have any concerns or questions following this visit please schedule a follow up visit with your Primary Care doctor or seek care at a local urgent care clinic to avoid delays in care.

## 2021-03-09 ENCOUNTER — Telehealth: Payer: Self-pay | Admitting: *Deleted

## 2021-03-09 NOTE — Telephone Encounter (Signed)
Patient is scheduled with Dr Kae Heller at [place]

## 2021-03-14 ENCOUNTER — Encounter: Payer: Medicare Other | Admitting: Gastroenterology

## 2021-03-21 ENCOUNTER — Other Ambulatory Visit: Payer: Self-pay

## 2021-03-21 ENCOUNTER — Ambulatory Visit (AMBULATORY_SURGERY_CENTER): Payer: Medicare Other

## 2021-03-21 ENCOUNTER — Encounter: Payer: Self-pay | Admitting: Gastroenterology

## 2021-03-21 VITALS — Ht 62.0 in | Wt 175.0 lb

## 2021-03-21 DIAGNOSIS — C569 Malignant neoplasm of unspecified ovary: Secondary | ICD-10-CM

## 2021-03-21 MED ORDER — PLENVU 140 G PO SOLR: 1.0000 | ORAL | 0 refills | Status: DC

## 2021-03-21 NOTE — Progress Notes (Signed)
Pre visit completed via phone call; Patient verified name, DOB, and address; No egg or soy allergy known to patient  No issues known to pt with past sedation with any surgeries or procedures Patient denies ever being told they had issues or difficulty with intubation  No FH of Malignant Hyperthermia Pt is not on diet pills Pt is not on  home 02  Pt is not on blood thinners  Pt denies issues with constipation at this time;  No A fib or A flutter Patient has completed COVID vaccines x 2 + booster; Medicare coupon given to pt in [place] today and NO PA's for preps discussed with pt in [place] today  Discussed with pt there will be an out-of-pocket cost for prep and that varies from $0 to 70 +  dollars - pt verbalized understanding  Due to the COVID-19 pandemic we are asking patients to follow certain guidelines in [place] and the [place]   Pt aware of COVID protocols and [facility] guidelines

## 2021-04-05 ENCOUNTER — Ambulatory Visit (AMBULATORY_SURGERY_CENTER): Payer: Medicare Other | Admitting: Gastroenterology

## 2021-04-05 ENCOUNTER — Other Ambulatory Visit: Payer: Self-pay

## 2021-04-05 ENCOUNTER — Encounter: Payer: Self-pay | Admitting: Gastroenterology

## 2021-04-05 VITALS — BP 157/71 | HR 63 | Temp 97.9°F | Resp 15 | Ht 62.0 in | Wt 175.0 lb

## 2021-04-05 DIAGNOSIS — D120 Benign neoplasm of cecum: Secondary | ICD-10-CM

## 2021-04-05 DIAGNOSIS — Z1211 Encounter for screening for malignant neoplasm of colon: Secondary | ICD-10-CM | POA: Diagnosis not present

## 2021-04-05 DIAGNOSIS — C569 Malignant neoplasm of unspecified ovary: Secondary | ICD-10-CM

## 2021-04-05 MED ORDER — SODIUM CHLORIDE 0.9 % IV SOLN: 500.0000 mL | Freq: Once | INTRAVENOUS | Status: DC

## 2021-04-05 NOTE — Progress Notes (Signed)
Report given to PACU, vss 

## 2021-04-05 NOTE — Progress Notes (Signed)
Pt's states no medical or surgical changes since previsit or office visit.   CW vitals and Mo IV. 

## 2021-04-05 NOTE — Progress Notes (Signed)
Referring Provider: Caren Macadam, MD Primary Care Physician:  Caren Macadam, MD  Reason for Procedure:  Colon cancer surveillance  IMPRESSION:  Need for colon cancer surveillance Appropriate candidate for monitored anesthesia care  PLAN: Colonoscopy in the [place] today   HPI: Theresa Frazier is a 65 y.o. female presents for surveillance colonoscopy.  Initial screening colonoscopy with Dr. Earlean Shawl 11/15/2006 was normal.  Repeat colonoscopy recommended in 5 to 10 years.  Surveillance colonoscopy 12/19/2015 was performed to the cecum.  The prep quality excellent.  Findings were normal.  Repeat colonoscopy recommended in 10 years. She was anxious about waiting 5 years for colonoscopy given her personal history of ovarian cancer. She spoke with her insurance and they agreed to cover a colonoscopy now instead of waiting for 10 years.    Mother had IBS and pancreatic cancer.  No baseline GI symptoms except for a symptomatic abdominal wall incisional hernia.   No known family history of colon cancer or polyps. No family history of uterine/endometrial cancer or gastric/stomach cancer.   Past Medical History:  Diagnosis Date   Cancer [facility])    Chest pain    atypical, myoview 05/31/05-EF 75%, low risk   Dyslipidemia    Family history of early CAD    Genetic testing 09/28/2016   Genetic testing revealed ONE pathogenic mutation in [place]. Though a pathogenic mutation was identified, increased risk for cancer is only associated with TWO (biallelic) pathogenic mutations. No additional mutations or variants of uncertain significance were identified. Testing was performed through [facility]'s 46-gene Common Hereditary Cancers Panel. [facility]'s Common Hereditary Cancers Panel includ   GERD (gastroesophageal reflux disease)    Hyperlipidemia    diet controlled   Inguinal hernia    RIGHT side   Osteopenia    Ovarian cancer ([place]) 2017    Past Surgical History:  Procedure Laterality Date    ABDOMINAL HYSTERECTOMY Left 05/31/2016   Procedure: HYSTERECTOMY ABDOMINAL;  Surgeon: Everitt Amber, MD;  [facility];  Service: Gynecology;  Laterality: Left;   BREAST SURGERY Right 1993   breast-calcification   COLONOSCOPY     LAPAROTOMY N/A 05/31/2016   Procedure: EXPLORATORY LAPAROTOMY;  Surgeon: Everitt Amber, MD;  [facility];  Service: Gynecology;  Laterality: N/A;   OMENTECTOMY  05/31/2016   Procedure: OMENTECTOMY AND STAGING FOR OVARIAN CANCER;  Surgeon: Everitt Amber, MD;  [facility];  Service: Gynecology;;   SALPINGOOPHORECTOMY Right 05/31/2016   Procedure: BILATERAL SALPINGO OOPHORECTOMY;  Surgeon: Everitt Amber, MD;  [facility];  Service: Gynecology;  Laterality: Right;   TONSILLECTOMY     WISDOM TOOTH EXTRACTION      Current Outpatient Medications  Medication Sig Dispense Refill   Collagen Hydrolysate POWD Take 1 Scoop by mouth daily at 6 (six) AM.     hydroxypropyl methylcellulose / hypromellose (ISOPTO TEARS / GONIOVISC) 2.5 % ophthalmic solution Place 1-2 drops into both eyes 3 (three) times daily as needed for dry eyes.     fluticasone (FLONASE) 50 MCG/ACT nasal spray Place 1 spray into both nostrils daily. (Patient taking differently: Place 1 spray into both nostrils daily as needed.) 16 g 2   Current Facility-Administered Medications  Medication Dose Route Frequency Provider Last Rate Last Admin   0.9 %  sodium chloride infusion  500 mL Intravenous Once Thornton Park, MD        Allergies as of 04/05/2021   (No Known Allergies)    Family History  Problem Relation Age of Onset  Pancreatic cancer Mother 29       d.73 - two months after diagnosis   Depression Mother    Diabetes Mother        at end of life   Hearing loss Father    Heart attack Father 89   Heart disease Father    Hypertension Sister    Arthritis Sister    Mental illness Sister        closed head injury as teen   Alcohol abuse Sister    Colon polyps Brother 48   Alcohol  abuse Brother    Diabetes Brother    Arthritis Brother    Addison's disease Maternal Aunt 54   Leukemia Maternal Uncle 28       d.59 - shortly after diagnosis   Alcohol abuse Maternal Grandmother    Drug abuse Maternal Grandfather    Heart attack Paternal Grandfather    Melanoma Daughter 28   Esophageal cancer Neg Hx    Rectal cancer Neg Hx    Stomach cancer Neg Hx    Colon cancer Neg Hx      Physical Exam: General:   Alert,  well-nourished, pleasant and cooperative in NAD Head:  Normocephalic and atraumatic. Eyes:  Sclera clear, no icterus.   Conjunctiva pink. Mouth:  No deformity or lesions.   Neck:  Supple; no masses or thyromegaly. Lungs:  Clear throughout to auscultation.   No wheezes. Heart:  Regular rate and rhythm; no murmurs. Abdomen:  Soft, non-tender, nondistended, normal bowel sounds, no rebound or guarding.  Msk:  Symmetrical. No boney deformities LAD: No inguinal or umbilical LAD Extremities:  No clubbing or edema. Neurologic:  Alert and  oriented x4;  grossly nonfocal Skin:  No obvious rash or bruise. Psych:  Alert and cooperative. Normal mood and affect.    Rakesha Dalporto L. Tarri Glenn, MD, MPH 04/05/2021, 8:37 AM

## 2021-04-05 NOTE — Addendum Note (Signed)
Addended by: Lexine Baton E on: 04/05/2021 01:33 PM   Modules accepted: Orders

## 2021-04-05 NOTE — Op Note (Signed)
[place] Patient Name: Theresa Frazier Procedure Date: 04/05/2021 8:41 AM MRN: [id number] Endoscopist: Thornton Park MD, MD Age: 65 Referring MD:  Date of Birth: 06-Sep-1955 Gender: Female Account #: [id number] Procedure:                Colonoscopy Indications:              Screening for colorectal malignant neoplasm                           Initial screening colonoscopy with Dr. Allyn Kenner                            11/2006 was normal. Surveillance colonoscopy with                            Dr. Allyn Kenner 12/2015 was normal.                           Surveillance recommended in 10 years. Given her                            history of ovarian cancer, she did not want to wait                            10 years. She spoke with her insurance company who                            agreed to cover a colonoscopy now.                           No known family history of colon cancer or polyps. Medicines:                Monitored Anesthesia Care Procedure:                Pre-Anesthesia Assessment:                           - Prior to the procedure, a History and Physical                            was performed, and patient medications and                            allergies were reviewed. The patient's tolerance of                            previous anesthesia was also reviewed. The risks                            and benefits of the procedure and the sedation                            options and risks were discussed with the patient.  All questions were answered, and informed consent                            was obtained. Prior Anticoagulants: The patient has                            taken no previous anticoagulant or antiplatelet                            agents. ASA Grade Assessment: II - A patient with                            mild systemic disease. After reviewing the risks                            and benefits, the patient was deemed in                             satisfactory condition to undergo the procedure.                           After obtaining informed consent, the colonoscope                            was passed under direct vision. Throughout the                            procedure, the patient's blood pressure, pulse, and                            oxygen saturations were monitored continuously. The                            Olympus CF-HQ190L [id number]) Colonoscope was                            introduced through the anus and advanced to the 3                            cm into the ileum. The colonoscopy was performed                            without difficulty. The patient tolerated the                            procedure well. The quality of the bowel                            preparation was good. The terminal ileum, ileocecal                            valve, appendiceal orifice, and rectum were  photographed. Scope In: 8:51:01 AM Scope Out: 9:04:27 AM Scope Withdrawal Time: 0 hours 10 minutes 57 seconds  Total Procedure Duration: 0 hours 13 minutes 26 seconds  Findings:                 The perianal and digital rectal examinations were                            normal.                           A less than 1 mm polyp was found in the cecum. The                            polyp was flat. The polyp was removed with a cold                            biopsy forceps. Resection and retrieval were                            complete. Estimated blood loss was minimal.                           The exam was otherwise without abnormality on                            direct and retroflexion views. Complications:            No immediate complications. Estimated blood loss:                            Minimal. Estimated Blood Loss:     Estimated blood loss was minimal. Impression:               - One less than 1 mm polyp in the cecum, removed                            with a cold  biopsy forceps. Resected and retrieved.                           - The examination was otherwise normal on direct                            and retroflexion views. Recommendation:           - Patient has a contact number available for                            emergencies. The signs and symptoms of potential                            delayed complications were discussed with the                            patient. Return to normal activities tomorrow.  Written discharge instructions were provided to the                            patient.                           - Resume previous diet.                           - Continue present medications.                           - Await pathology results.                           - Repeat colonoscopy date to be determined after                            pending pathology results are reviewed for                            surveillance.                           - Emerging evidence supports eating a diet of                            fruits, vegetables, grains, calcium, and yogurt                            while reducing red meat and alcohol may reduce the                            risk of colon cancer.                           - Thank you for allowing me to be involved in your                            colon cancer prevention. Thornton Park MD, MD 04/05/2021 9:09:33 AM This report has been signed electronically.

## 2021-04-05 NOTE — Progress Notes (Signed)
Called to room to assist during endoscopic procedure.  Patient ID and intended procedure confirmed with present staff. Received instructions for my participation in the procedure from the performing physician.  

## 2021-04-05 NOTE — Patient Instructions (Signed)
Handout given on polyps. Await pathology results. Repeat colonoscopy for screening will be determined based off of pathology results.   OU HAD AN ENDOSCOPIC PROCEDURE TODAY AT [facility]:   Refer to the procedure report that was given to you for any specific questions about what was found during the examination.  If the procedure report does not answer your questions, please call your gastroenterologist to clarify.  If you requested that your care partner not be given the details of your procedure findings, then the procedure report has been included in a sealed envelope for you to review at your convenience later.  YOU SHOULD EXPECT: Some feelings of bloating in the abdomen. Passage of more gas than usual.  Walking can help get rid of the air that was put into your GI tract during the procedure and reduce the bloating. If you had a lower endoscopy (such as a colonoscopy or flexible sigmoidoscopy) you may notice spotting of blood in your stool or on the toilet paper. If you underwent a bowel prep for your procedure, you may not have a normal bowel movement for a few days.  Please Note:  You might notice some irritation and congestion in your nose or some drainage.  This is from the oxygen used during your procedure.  There is no need for concern and it should clear up in a day or so.  SYMPTOMS TO REPORT IMMEDIATELY:  Following lower endoscopy (colonoscopy or flexible sigmoidoscopy):  Excessive amounts of blood in the stool  Significant tenderness or worsening of abdominal pains  Swelling of the abdomen that is new, acute  Fever of 100F or higher  For urgent or emergent issues, a gastroenterologist can be reached at any hour by calling [phone]. Do not use MyChart messaging for urgent concerns.    DIET:  We do recommend a small meal at first, but then you may proceed to your regular diet.  Drink plenty of fluids but you should avoid alcoholic beverages for 24  hours.  ACTIVITY:  You should plan to take it easy for the rest of today and you should NOT DRIVE or use heavy machinery until tomorrow (because of the sedation medicines used during the test).    FOLLOW UP: Our staff will call the number listed on your records 48-72 hours following your procedure to check on you and address any questions or concerns that you may have regarding the information given to you following your procedure. If we do not reach you, we will leave a message.  We will attempt to reach you two times.  During this call, we will ask if you have developed any symptoms of COVID 19. If you develop any symptoms (ie: fever, flu-like symptoms, shortness of breath, cough etc.) before then, please call [phone].  If you test positive for Covid 19 in the 2 weeks post procedure, please call and report this information to [place].    If any biopsies were taken you will be contacted by phone or by letter within the next 1-3 weeks.  Please call us at [phone] if you have not heard about the biopsies in 3 weeks.    SIGNATURES/CONFIDENTIALITY: You and/or your care partner have signed paperwork which will be entered into your electronic medical record.  These signatures attest to the fact that that the information above on your After Visit Summary has been reviewed and is understood.  Full responsibility of the confidentiality of this discharge information lies with you and/or your  care-partner.  

## 2021-04-06 ENCOUNTER — Ambulatory Visit: Payer: Self-pay | Admitting: Surgery

## 2021-04-06 DIAGNOSIS — K432 Incisional hernia without obstruction or gangrene: Secondary | ICD-10-CM | POA: Diagnosis not present

## 2021-04-06 NOTE — H&P (View-Only) (Signed)
    Theresa Frazier [id number]   Referring Provider:  Thereasa Solo, MD   Subjective   Chief Complaint: Hernia   History of Present Illness: This is a very pleasant 65 year old woman who presents with an increasingly symptomatic incisional hernia.  She had an open hysterectomy with bilateral salpingo-oophorectomy 5 years ago for ovarian cancer with Dr. Denman George and initially noticed a very small protrusion to the right of the incision.  This has increased in size with time.  She denies any obstructive symptoms but does have some discomfort in the area.  She is interested in proceeding with surgical repair.   Review of Systems: A complete review of systems was obtained from the patient.  I have reviewed this information and discussed as appropriate with the patient.  See HPI as well for other ROS.   Medical History: Past Medical History:  Diagnosis Date   History of cancer     There is no problem list on file for this patient.   Past Surgical History:  Procedure Laterality Date   HYSTERECTOMY       No Known Allergies  Current Outpatient Medications on File Prior to Visit  Medication Sig Dispense Refill   collagen, hydrolysate, bovine, (COLLAGEN, HYDR, BOVINE,, BULK,) 100 % Powd Take by mouth     No current facility-administered medications on file prior to visit.    Family History  Problem Relation Age of Onset   Diabetes Mother    High blood pressure (Hypertension) Father    Coronary Artery Disease (Blocked arteries around heart) Father    Diabetes Brother      Social History   Tobacco Use  Smoking Status Former Smoker   Quit date: 1987   Years since quitting: 35.8  Smokeless Tobacco Never Used     Social History   Socioeconomic History   Marital status: Married  Tobacco Use   Smoking status: Former Smoker    Quit date: 1987    Years since quitting: 35.8   Smokeless tobacco: Never Used  Scientific laboratory technician Use: Never used  Substance and Sexual  Activity   Alcohol use: Not Currently   Drug use: Defer   Sexual activity: Defer    Objective:    Vitals:   04/06/21 1006  BP: 130/64  Pulse: 68  Temp: 36.9 C (98.4 F)  SpO2: 99%  Weight: 78.7 kg (173 lb 6.4 oz)  Height: 157.5 cm (5\' 2" )    Body mass index is 31.72 kg/m.  Gen: A&Ox3, no distress  Unlabored respirations Abdomen soft, nontender, nondistended.  Well-healed low midline vertical incision with reducible incisional hernia, defect is approximately 3 cm in diameter.     Assessment and Plan:  Diagnoses and all orders for this visit:  Ventral incisional hernia -     [facility]S Case Posting Request; Future   Incisional hernia which is increasing in size, she did have a CT scan 3 years ago demonstrating this with bowel immediately under the skin.  We discussed options for treatment and I recommended a laparoscopic assisted repair with mesh underlay.  I discussed the surgery in detail with her and we went over risks of bleeding, infection, pain, scarring, injury to bowel or other intra-abdominal structures, seroma/hematoma, complications from the mesh, hernia recurrence, as well as cardiovascular/pulmonary/thromboembolic risks.  Questions welcomed and answered.  She wishes to proceed with surgery.  Dariella Gillihan Raquel James, MD

## 2021-04-06 NOTE — H&P (Signed)
    Theresa Frazier [id number]   Referring Provider:  Thereasa Solo, MD   Subjective   Chief Complaint: Hernia   History of Present Illness: This is a very pleasant 65 year old woman who presents with an increasingly symptomatic incisional hernia.  She had an open hysterectomy with bilateral salpingo-oophorectomy 5 years ago for ovarian cancer with Dr. Denman George and initially noticed a very small protrusion to the right of the incision.  This has increased in size with time.  She denies any obstructive symptoms but does have some discomfort in the area.  She is interested in proceeding with surgical repair.   Review of Systems: A complete review of systems was obtained from the patient.  I have reviewed this information and discussed as appropriate with the patient.  See HPI as well for other ROS.   Medical History: Past Medical History:  Diagnosis Date   History of cancer     There is no problem list on file for this patient.   Past Surgical History:  Procedure Laterality Date   HYSTERECTOMY       No Known Allergies  Current Outpatient Medications on File Prior to Visit  Medication Sig Dispense Refill   collagen, hydrolysate, bovine, (COLLAGEN, HYDR, BOVINE,, BULK,) 100 % Powd Take by mouth     No current facility-administered medications on file prior to visit.    Family History  Problem Relation Age of Onset   Diabetes Mother    High blood pressure (Hypertension) Father    Coronary Artery Disease (Blocked arteries around heart) Father    Diabetes Brother      Social History   Tobacco Use  Smoking Status Former Smoker   Quit date: 1987   Years since quitting: 35.8  Smokeless Tobacco Never Used     Social History   Socioeconomic History   Marital status: Married  Tobacco Use   Smoking status: Former Smoker    Quit date: 1987    Years since quitting: 35.8   Smokeless tobacco: Never Used  Scientific laboratory technician Use: Never used  Substance and Sexual  Activity   Alcohol use: Not Currently   Drug use: Defer   Sexual activity: Defer    Objective:    Vitals:   04/06/21 1006  BP: 130/64  Pulse: 68  Temp: 36.9 C (98.4 F)  SpO2: 99%  Weight: 78.7 kg (173 lb 6.4 oz)  Height: 157.5 cm (5\' 2" )    Body mass index is 31.72 kg/m.  Gen: A&Ox3, no distress  Unlabored respirations Abdomen soft, nontender, nondistended.  Well-healed low midline vertical incision with reducible incisional hernia, defect is approximately 3 cm in diameter.     Assessment and Plan:  Diagnoses and all orders for this visit:  Ventral incisional hernia -     CCS Case Posting Request; Future   Incisional hernia which is increasing in size, she did have a CT scan 3 years ago demonstrating this with bowel immediately under the skin.  We discussed options for treatment and I recommended a laparoscopic assisted repair with mesh underlay.  I discussed the surgery in detail with her and we went over risks of bleeding, infection, pain, scarring, injury to bowel or other intra-abdominal structures, seroma/hematoma, complications from the mesh, hernia recurrence, as well as cardiovascular/pulmonary/thromboembolic risks.  Questions welcomed and answered.  She wishes to proceed with surgery.  Jasminne Mealy Raquel James, MD

## 2021-04-07 ENCOUNTER — Telehealth: Payer: Self-pay | Admitting: *Deleted

## 2021-04-07 NOTE — Telephone Encounter (Signed)
  Follow up Call-  Call back number 04/05/2021  Post procedure Call Back phone  # [phone]  Permission to leave phone message Yes  Some recent data might be hidden     Patient questions:  Do you have a fever, pain , or abdominal swelling? No. Pain Score  0 *  Have you tolerated food without any problems? Yes.    Have you been able to return to your normal activities? Yes.    Do you have any questions about your discharge instructions: Diet   No. Medications  No. Follow up visit  No.  Do you have questions or concerns about your Care? No.  Actions: * If pain score is 4 or above: No action needed, pain <4.  Have you developed a fever since your procedure? no  2.   Have you had an respiratory symptoms (SOB or cough) since your procedure? no  3.   Have you tested positive for COVID 19 since your procedure no  4.   Have you had any family members/close contacts diagnosed with the COVID 19 since your procedure?  no   If yes to any of these questions please route to [name], RN and Joella Prince, RN

## 2021-04-11 ENCOUNTER — Encounter: Payer: Self-pay | Admitting: Gastroenterology

## 2021-04-11 DIAGNOSIS — D485 Neoplasm of uncertain behavior of skin: Secondary | ICD-10-CM | POA: Diagnosis not present

## 2021-04-11 DIAGNOSIS — B079 Viral wart, unspecified: Secondary | ICD-10-CM | POA: Diagnosis not present

## 2021-04-11 DIAGNOSIS — Z23 Encounter for immunization: Secondary | ICD-10-CM | POA: Diagnosis not present

## 2021-04-17 NOTE — Patient Instructions (Addendum)
DUE TO COVID-19 ONLY ONE VISITOR IS ALLOWED TO COME WITH YOU AND STAY IN THE WAITING ROOM ONLY DURING PRE OP AND PROCEDURE DAY OF SURGERY IF YOU ARE GOING HOME AFTER SURGERY. IF YOU ARE SPENDING THE NIGHT 2 PEOPLE MAY VISIT WITH YOU IN YOUR PRIVATE ROOM AFTER SURGERY UNTIL VISITING  HOURS ARE OVER AT 800 PM AND 1  VISITOR  MAY  SPEND THE NIGHT.                Theresa Frazier     Your procedure is scheduled on: 04/21/21   Report to [facility] Main  Entrance   Report to short stay 5:15 AM     Call this number if you have problems the morning of surgery [phone]    Remember: Do not eat food or drink :After Midnight the night before your surgery     BRUSH YOUR [place], NO CHEWING GUM [place].     Take these medicines the morning of surgery with A SIP OF WATER: none                                You may not have any metal on your body including hair pins and              piercings  Do not wear jewelry, make-up, lotions, powders or perfumes, deodorant             Do not wear nail polish on your fingernails.  Do not shave  48 hours prior to surgery.              Do not bring valuables to the hospital. [place].  Contacts, dentures or bridgework may not be worn into surgery.  Leave suitcase in the car. After surgery it may be brought to your room.     Patients discharged the day of surgery will not be allowed to drive home. IF YOU ARE HAVING SURGERY AND GOING HOME THE SAME DAY, YOU MUST HAVE AN ADULT TO DRIVE YOU HOME AND BE WITH YOU FOR 24 HOURS. YOU MAY GO HOME BY TAXI OR UBER OR ORTHERWISE, BUT AN ADULT MUST ACCOMPANY YOU HOME AND STAY WITH YOU FOR 24 HOURS.  Name and phone number of your driver:  Special Instructions: N/A              Please read over the following fact sheets you were  given: _____________________________________________________________________             [facility] - Preparing for Surgery Before surgery, you can play an important role.  Because skin is not sterile, your skin needs to be as free of germs as possible.  You can reduce the number of germs on your skin by washing with CHG (chlorahexidine gluconate) soap before surgery.  CHG is an antiseptic cleaner which kills germs and bonds with the skin to continue killing germs even after washing. Please DO NOT use if you have an allergy to CHG or antibacterial soaps.  If your skin becomes reddened/irritated stop using the CHG and inform your nurse when you arrive at Short Stay. Do not shave (including legs and underarms) for at least 48 hours prior to the first CHG shower.   Please follow these  instructions carefully:  1.  Shower with CHG Soap the night before surgery and the  morning of Surgery.  2.  If you choose to wash your hair, wash your hair first as usual with your  normal  shampoo.  3.  After you shampoo, rinse your hair and body thoroughly to remove the  shampoo.                            4.  Use CHG as you would any other liquid soap.  You can apply chg directly  to the skin and wash                       Gently with a scrungie or clean washcloth.  5.  Apply the CHG Soap to your body ONLY FROM THE NECK DOWN.   Do not use on face/ open                           Wound or open sores. Avoid contact with eyes, ears mouth and genitals (private parts).                       Wash face,  Genitals (private parts) with your normal soap.             6.  Wash thoroughly, paying special attention to the area where your surgery  will be performed.  7.  Thoroughly rinse your body with warm water from the neck down.  8.  DO NOT shower/wash with your normal soap after using and rinsing off  the CHG Soap.                9.  Pat yourself dry with a clean towel.            10.  Wear clean pajamas.            11.   Place clean sheets on your bed the night of your first shower and do not  sleep with pets. Day of Surgery : Do not apply any lotions/deodorants the morning of surgery.  Please wear clean clothes to the hospital/surgery center.  FAILURE TO FOLLOW THESE INSTRUCTIONS MAY RESULT IN THE CANCELLATION OF YOUR SURGERY PATIENT SIGNATURE_________________________________  NURSE SIGNATURE__________________________________  ________________________________________________________________________

## 2021-04-18 ENCOUNTER — Other Ambulatory Visit: Payer: Self-pay

## 2021-04-18 ENCOUNTER — Encounter (HOSPITAL_COMMUNITY): Payer: Self-pay

## 2021-04-18 ENCOUNTER — Encounter (HOSPITAL_COMMUNITY)
Admission: RE | Admit: 2021-04-18 | Discharge: 2021-04-18 | Disposition: A | Discharge status: Home / Self Care | Payer: Medicare Other | Source: Ambulatory Visit | Attending: Surgery | Admitting: Surgery

## 2021-04-18 VITALS — BP 144/78 | HR 70 | Temp 98.1°F | Resp 16 | Ht 62.0 in | Wt 173.0 lb

## 2021-04-18 DIAGNOSIS — Z01818 Encounter for other preprocedural examination: Secondary | ICD-10-CM

## 2021-04-18 DIAGNOSIS — Z01812 Encounter for preprocedural laboratory examination: Secondary | ICD-10-CM | POA: Diagnosis not present

## 2021-04-18 LAB — CBC
HCT: 41.2 % (ref 36.0–46.0)
Hemoglobin: 13.3 g/dL (ref 12.0–15.0)
MCH: 30.8 pg (ref 26.0–34.0)
MCHC: 32.3 g/dL (ref 30.0–36.0)
MCV: 95.4 fL (ref 80.0–100.0)
Platelets: 230 10*3/uL (ref 150–400)
RBC: 4.32 MIL/uL (ref 3.87–5.11)
RDW: 13.2 % (ref 11.5–15.5)
WBC: 4.9 10*3/uL (ref 4.0–10.5)
nRBC: 0 % (ref 0.0–0.2)

## 2021-04-18 NOTE — Progress Notes (Signed)
COVID test- NA 90 day policy. Paperwork on chart   PCP - Dr. Benjie Karvonen Cardiologist - no  Chest x-ray - 02/18/21 EKG - no Stress Test - no ECHO - no Cardiac Cath - no Pacemaker/ICD device last checked:+NA  Sleep Study - no CPAP -   Fasting Blood Sugar - NA Checks Blood Sugar _____ times a day  Blood Thinner Instructions:NA Aspirin Instructions: Last Dose:  Anesthesia review: no  Patient denies shortness of breath, fever, cough and chest pain at PAT appointment Pt has no SOB with any activities  Patient verbalized understanding of instructions that were given to them at the PAT appointment. Patient was also instructed that they will need to review over the PAT instructions again at home before surgery. yes

## 2021-04-20 NOTE — Anesthesia Preprocedure Evaluation (Addendum)
Anesthesia Evaluation  Patient identified by MRN, date of birth, ID band Patient awake    Reviewed: Allergy & Precautions, NPO status , Patient's Chart, lab work & pertinent test results  Airway Mallampati: II  TM Distance: >3 FB Neck ROM: Full    Dental no notable dental hx. (+) Teeth Intact, Dental Advisory Given   Pulmonary former smoker,    Pulmonary exam normal breath sounds clear to auscultation       Cardiovascular negative cardio ROS Normal cardiovascular exam Rhythm:Regular Rate:Normal     Neuro/Psych    GI/Hepatic Neg liver ROS, GERD  ,  Endo/Other  negative endocrine ROS  Renal/GU negative Renal ROSLab Results      Component                Value               Date                      CREATININE               0.97                02/18/2021                BUN                      9                   02/18/2021                NA                       136                 02/18/2021                K                        3.4 (L)             02/18/2021                CL                       104                 02/18/2021                CO2                      23                  02/18/2021             Female GU complaint (hx of ovarian CX)     Musculoskeletal negative musculoskeletal ROS (+)   Abdominal (+) + obese (BMI 31.64),   Peds  Hematology Lab Results      Component                Value               Date                      WBC  4.9                 04/18/2021                HGB                      13.3                04/18/2021                HCT                      41.2                04/18/2021                MCV                      95.4                04/18/2021                PLT                      230                 04/18/2021              Anesthesia Other Findings   Reproductive/Obstetrics                            Anesthesia  Physical Anesthesia Plan  ASA: 2  Anesthesia Plan: General   Post-op Pain Management:    Induction: Intravenous  PONV Risk Score and Plan: 4 or greater and Treatment may vary due to age or medical condition, Midazolam, Ondansetron and Dexamethasone  Airway Management Planned: Oral ETT  Additional Equipment: None  Intra-op Plan:   Post-operative Plan: Extubation in OR  Informed Consent: I have reviewed the patients History and Physical, chart, labs and discussed the procedure including the risks, benefits and alternatives for the proposed anesthesia with the patient or authorized representative who has indicated his/her understanding and acceptance.     Dental advisory given  Plan Discussed with: CRNA and Anesthesiologist  Anesthesia Plan Comments:        Anesthesia Quick Evaluation

## 2021-04-21 ENCOUNTER — Ambulatory Visit (HOSPITAL_COMMUNITY): Payer: Medicare Other | Admitting: Anesthesiology

## 2021-04-21 ENCOUNTER — Ambulatory Visit (HOSPITAL_COMMUNITY)
Admission: RE | Admit: 2021-04-21 | Discharge: 2021-04-21 | Disposition: A | Discharge status: Home / Self Care | Payer: Medicare Other | Attending: Surgery | Admitting: Surgery

## 2021-04-21 ENCOUNTER — Encounter (HOSPITAL_COMMUNITY): Payer: Self-pay | Admitting: Surgery

## 2021-04-21 ENCOUNTER — Encounter (HOSPITAL_COMMUNITY)
Admission: RE | Disposition: A | Discharge status: Home / Self Care | Payer: Self-pay | Source: Home / Self Care | Attending: Surgery

## 2021-04-21 DIAGNOSIS — K432 Incisional hernia without obstruction or gangrene: Secondary | ICD-10-CM | POA: Insufficient documentation

## 2021-04-21 DIAGNOSIS — E785 Hyperlipidemia, unspecified: Secondary | ICD-10-CM | POA: Diagnosis not present

## 2021-04-21 DIAGNOSIS — Z9889 Other specified postprocedural states: Secondary | ICD-10-CM

## 2021-04-21 DIAGNOSIS — C561 Malignant neoplasm of right ovary: Secondary | ICD-10-CM | POA: Diagnosis not present

## 2021-04-21 DIAGNOSIS — Z8719 Personal history of other diseases of the digestive system: Secondary | ICD-10-CM | POA: Diagnosis present

## 2021-04-21 DIAGNOSIS — Z87891 Personal history of nicotine dependence: Secondary | ICD-10-CM | POA: Diagnosis not present

## 2021-04-21 DIAGNOSIS — K219 Gastro-esophageal reflux disease without esophagitis: Secondary | ICD-10-CM | POA: Diagnosis not present

## 2021-04-21 DIAGNOSIS — K430 Incisional hernia with obstruction, without gangrene: Secondary | ICD-10-CM | POA: Diagnosis not present

## 2021-04-21 HISTORY — PX: INCISIONAL HERNIA REPAIR: SHX193

## 2021-04-21 SURGERY — REPAIR, HERNIA, INCISIONAL, LAPAROSCOPIC
Anesthesia: General

## 2021-04-21 MED ORDER — DEXAMETHASONE SODIUM PHOSPHATE 10 MG/ML IJ SOLN
INTRAMUSCULAR | Status: DC | PRN
  Administered 2021-04-21: 8 mg via INTRAVENOUS

## 2021-04-21 MED ORDER — ONDANSETRON HCL 4 MG/2ML IJ SOLN
INTRAMUSCULAR | Status: AC
  Filled 2021-04-21: qty 2

## 2021-04-21 MED ORDER — KETOROLAC TROMETHAMINE 30 MG/ML IJ SOLN
30.0000 mg | Freq: Once | INTRAMUSCULAR | Status: AC | PRN
  Administered 2021-04-21: 30 mg via INTRAVENOUS

## 2021-04-21 MED ORDER — MIDAZOLAM HCL 2 MG/2ML IJ SOLN
INTRAMUSCULAR | Status: AC
  Filled 2021-04-21: qty 2

## 2021-04-21 MED ORDER — HYDROMORPHONE HCL 1 MG/ML IJ SOLN
INTRAMUSCULAR | Status: AC
  Filled 2021-04-21: qty 1

## 2021-04-21 MED ORDER — 0.9 % SODIUM CHLORIDE (POUR BTL) OPTIME
TOPICAL | Status: DC | PRN
  Administered 2021-04-21: 1000 mL

## 2021-04-21 MED ORDER — ROCURONIUM BROMIDE 10 MG/ML (PF) SYRINGE
PREFILLED_SYRINGE | INTRAVENOUS | Status: AC
  Filled 2021-04-21: qty 10

## 2021-04-21 MED ORDER — SUGAMMADEX SODIUM 200 MG/2ML IV SOLN
INTRAVENOUS | Status: DC | PRN
  Administered 2021-04-21: 200 mg via INTRAVENOUS

## 2021-04-21 MED ORDER — PROPOFOL 10 MG/ML IV BOLUS
INTRAVENOUS | Status: AC
  Filled 2021-04-21: qty 20

## 2021-04-21 MED ORDER — CHLORHEXIDINE GLUCONATE 0.12 % MT SOLN
15.0000 mL | Freq: Once | OROMUCOSAL | Status: AC
  Administered 2021-04-21: 15 mL via OROMUCOSAL

## 2021-04-21 MED ORDER — BUPIVACAINE LIPOSOME 1.3 % IJ SUSP
INTRAMUSCULAR | Status: AC
  Filled 2021-04-21: qty 20

## 2021-04-21 MED ORDER — CHLORHEXIDINE GLUCONATE CLOTH 2 % EX PADS: 6.0000 | MEDICATED_PAD | Freq: Once | CUTANEOUS | Status: DC

## 2021-04-21 MED ORDER — FENTANYL CITRATE (PF) 100 MCG/2ML IJ SOLN
INTRAMUSCULAR | Status: AC
  Filled 2021-04-21: qty 2

## 2021-04-21 MED ORDER — DOCUSATE SODIUM 100 MG PO CAPS: 100.0000 mg | ORAL_CAPSULE | Freq: Two times a day (BID) | ORAL | 0 refills | Status: DC

## 2021-04-21 MED ORDER — GABAPENTIN 300 MG PO CAPS
300.0000 mg | ORAL_CAPSULE | ORAL | Status: AC
  Administered 2021-04-21: 300 mg via ORAL
  Filled 2021-04-21: qty 1

## 2021-04-21 MED ORDER — BUPIVACAINE LIPOSOME 1.3 % IJ SUSP
INTRAMUSCULAR | Status: DC | PRN
  Administered 2021-04-21: 20 mL

## 2021-04-21 MED ORDER — LIDOCAINE HCL (PF) 2 % IJ SOLN
INTRAMUSCULAR | Status: AC
  Filled 2021-04-21: qty 5

## 2021-04-21 MED ORDER — EPHEDRINE 5 MG/ML INJ
INTRAVENOUS | Status: AC
  Filled 2021-04-21: qty 5

## 2021-04-21 MED ORDER — LIDOCAINE HCL (PF) 2 % IJ SOLN
INTRAMUSCULAR | Status: DC | PRN
  Administered 2021-04-21: 100 mg via INTRADERMAL

## 2021-04-21 MED ORDER — DEXAMETHASONE SODIUM PHOSPHATE 10 MG/ML IJ SOLN
INTRAMUSCULAR | Status: AC
  Filled 2021-04-21: qty 1

## 2021-04-21 MED ORDER — OXYCODONE HCL 5 MG/5ML PO SOLN: 5.0000 mg | Freq: Once | ORAL | Status: DC | PRN

## 2021-04-21 MED ORDER — ONDANSETRON HCL 4 MG/2ML IJ SOLN
INTRAMUSCULAR | Status: DC | PRN
  Administered 2021-04-21: 4 mg via INTRAVENOUS

## 2021-04-21 MED ORDER — HYDROMORPHONE HCL 1 MG/ML IJ SOLN
0.2500 mg | INTRAMUSCULAR | Status: DC | PRN
  Administered 2021-04-21 (×2): 0.5 mg via INTRAVENOUS

## 2021-04-21 MED ORDER — PHENYLEPHRINE 40 MCG/ML (10ML) SYRINGE FOR IV PUSH (FOR BLOOD PRESSURE SUPPORT)
PREFILLED_SYRINGE | INTRAVENOUS | Status: AC
  Filled 2021-04-21: qty 10

## 2021-04-21 MED ORDER — BUPIVACAINE LIPOSOME 1.3 % IJ SUSP: 20.0000 mL | Freq: Once | INTRAMUSCULAR | Status: DC

## 2021-04-21 MED ORDER — ROCURONIUM BROMIDE 10 MG/ML (PF) SYRINGE
PREFILLED_SYRINGE | INTRAVENOUS | Status: DC | PRN
  Administered 2021-04-21: 60 mg via INTRAVENOUS
  Administered 2021-04-21: 10 mg via INTRAVENOUS

## 2021-04-21 MED ORDER — EPHEDRINE SULFATE-NACL 50-0.9 MG/10ML-% IV SOSY
PREFILLED_SYRINGE | INTRAVENOUS | Status: DC | PRN
  Administered 2021-04-21 (×2): 5 mg via INTRAVENOUS

## 2021-04-21 MED ORDER — FENTANYL CITRATE (PF) 100 MCG/2ML IJ SOLN
INTRAMUSCULAR | Status: DC | PRN
  Administered 2021-04-21 (×2): 50 ug via INTRAVENOUS

## 2021-04-21 MED ORDER — KETOROLAC TROMETHAMINE 30 MG/ML IJ SOLN
INTRAMUSCULAR | Status: AC
  Filled 2021-04-21: qty 1

## 2021-04-21 MED ORDER — BUPIVACAINE-EPINEPHRINE 0.25% -1:200000 IJ SOLN
INTRAMUSCULAR | Status: DC | PRN
  Administered 2021-04-21: 30 mL

## 2021-04-21 MED ORDER — ONDANSETRON HCL 4 MG/2ML IJ SOLN: 4.0000 mg | Freq: Once | INTRAMUSCULAR | Status: DC | PRN

## 2021-04-21 MED ORDER — ACETAMINOPHEN 500 MG PO TABS
1000.0000 mg | ORAL_TABLET | ORAL | Status: AC
  Administered 2021-04-21: 1000 mg via ORAL
  Filled 2021-04-21: qty 2

## 2021-04-21 MED ORDER — MIDAZOLAM HCL 5 MG/5ML IJ SOLN
INTRAMUSCULAR | Status: DC | PRN
  Administered 2021-04-21: 2 mg via INTRAVENOUS

## 2021-04-21 MED ORDER — BUPIVACAINE-EPINEPHRINE 0.25% -1:200000 IJ SOLN
INTRAMUSCULAR | Status: AC
  Filled 2021-04-21: qty 1

## 2021-04-21 MED ORDER — ORAL CARE MOUTH RINSE: 15.0000 mL | Freq: Once | OROMUCOSAL | Status: AC

## 2021-04-21 MED ORDER — KETAMINE HCL 10 MG/ML IJ SOLN
INTRAMUSCULAR | Status: AC
  Filled 2021-04-21: qty 1

## 2021-04-21 MED ORDER — OXYCODONE HCL 5 MG PO TABS: 5.0000 mg | ORAL_TABLET | Freq: Three times a day (TID) | ORAL | 0 refills | Status: AC | PRN

## 2021-04-21 MED ORDER — PHENYLEPHRINE 40 MCG/ML (10ML) SYRINGE FOR IV PUSH (FOR BLOOD PRESSURE SUPPORT)
PREFILLED_SYRINGE | INTRAVENOUS | Status: DC | PRN
  Administered 2021-04-21: 80 ug via INTRAVENOUS
  Administered 2021-04-21: 120 ug via INTRAVENOUS

## 2021-04-21 MED ORDER — CEFAZOLIN SODIUM-DEXTROSE 2-4 GM/100ML-% IV SOLN
2.0000 g | INTRAVENOUS | Status: AC
  Administered 2021-04-21: 2 g via INTRAVENOUS
  Filled 2021-04-21: qty 100

## 2021-04-21 MED ORDER — OXYCODONE HCL 5 MG PO TABS: 5.0000 mg | ORAL_TABLET | Freq: Once | ORAL | Status: DC | PRN

## 2021-04-21 MED ORDER — LACTATED RINGERS IV SOLN: INTRAVENOUS | Status: DC

## 2021-04-21 MED ORDER — PROPOFOL 10 MG/ML IV BOLUS
INTRAVENOUS | Status: DC | PRN
  Administered 2021-04-21: 140 mg via INTRAVENOUS

## 2021-04-21 SURGICAL SUPPLY — 42 items
ADH SKN CLS APL DERMABOND .7 (GAUZE/BANDAGES/DRESSINGS) ×1
APL PRP STRL LF DISP 70% ISPRP (MISCELLANEOUS) ×1
APL SKNCLS STERI-STRIP NONHPOA (GAUZE/BANDAGES/DRESSINGS) ×1
BAG COUNTER SPONGE SURGICOUNT (BAG) IMPLANT
BAG SPNG CNTER NS LX DISP (BAG)
BENZOIN TINCTURE PRP APPL 2/3 (GAUZE/BANDAGES/DRESSINGS) ×1 IMPLANT
BINDER ABDOMINAL 12 ML 46-62 (SOFTGOODS) ×1 IMPLANT
CABLE HIGH FREQUENCY MONO STRZ (ELECTRODE) ×2 IMPLANT
CHLORAPREP W/TINT 26 (MISCELLANEOUS) ×2 IMPLANT
CLSR STERI-STRIP ANTIMIC 1/2X4 (GAUZE/BANDAGES/DRESSINGS) ×1 IMPLANT
COVER SURGICAL LIGHT HANDLE (MISCELLANEOUS) ×2 IMPLANT
DECANTER SPIKE VIAL GLASS SM (MISCELLANEOUS) IMPLANT
DERMABOND ADVANCED (GAUZE/BANDAGES/DRESSINGS) ×1
DERMABOND ADVANCED .7 DNX12 (GAUZE/BANDAGES/DRESSINGS) ×1 IMPLANT
DEVICE SECURE STRAP 25 ABSORB (INSTRUMENTS) ×2 IMPLANT
ELECT REM PT RETURN 15FT ADLT (MISCELLANEOUS) ×2 IMPLANT
GAUZE SPONGE 4X4 12PLY STRL (GAUZE/BANDAGES/DRESSINGS) ×1 IMPLANT
GLOVE SURG ENC MOIS LTX SZ6 (GLOVE) ×1 IMPLANT
GLOVE SURG MICRO LTX SZ6 (GLOVE) ×1 IMPLANT
GLOVE SURG UNDER LTX SZ6.5 (GLOVE) ×2 IMPLANT
GOWN STRL REUS W/TWL LRG LVL3 (GOWN DISPOSABLE) ×2 IMPLANT
GOWN STRL REUS W/TWL XL LVL3 (GOWN DISPOSABLE) ×4 IMPLANT
GRASPER SUT TROCAR 14GX15 (MISCELLANEOUS) ×1 IMPLANT
IRRIG SUCT STRYKERFLOW 2 WTIP (MISCELLANEOUS)
IRRIGATION SUCT STRKRFLW 2 WTP (MISCELLANEOUS) IMPLANT
KIT BASIN OR (CUSTOM PROCEDURE TRAY) ×2 IMPLANT
KIT TURNOVER KIT A (KITS) IMPLANT
MARKER SKIN DUAL TIP RULER LAB (MISCELLANEOUS) ×2 IMPLANT
MESH VENTRALIGHT ST 4X6IN (Mesh General) ×1 IMPLANT
PENCIL SMOKE EVACUATOR (MISCELLANEOUS) IMPLANT
SCISSORS LAP 5X35 DISP (ENDOMECHANICALS) ×2 IMPLANT
SET TUBE SMOKE EVAC HIGH FLOW (TUBING) ×2 IMPLANT
SHEARS HARMONIC ACE PLUS 36CM (ENDOMECHANICALS) ×1 IMPLANT
SLEEVE XCEL OPT CAN 5 100 (ENDOMECHANICALS) ×3 IMPLANT
STRIP CLOSURE SKIN 1/2X4 (GAUZE/BANDAGES/DRESSINGS) ×2 IMPLANT
SUT ETHIBOND 0 (SUTURE) ×4 IMPLANT
SUT MNCRL AB 4-0 PS2 18 (SUTURE) ×2 IMPLANT
SUT NOVA 1 T20/GS 25DT (SUTURE) ×2 IMPLANT
TOWEL OR 17X26 10 PK STRL BLUE (TOWEL DISPOSABLE) ×2 IMPLANT
TRAY LAPAROSCOPIC (CUSTOM PROCEDURE TRAY) ×2 IMPLANT
TROCAR BLADELESS OPT 5 100 (ENDOMECHANICALS) ×2 IMPLANT
TROCAR XCEL 12X100 BLDLESS (ENDOMECHANICALS) ×2 IMPLANT

## 2021-04-21 NOTE — Discharge Instructions (Signed)
HERNIA REPAIR: POST OP INSTRUCTIONS   EAT Gradually transition to a high fiber diet with a fiber supplement over the next few weeks after discharge.  Start with a pureed / full liquid diet (see below)  WALK Walk an hour a day (cumulative- not all at once).  Control your pain to do that.    CONTROL PAIN Control pain so that you can walk, sleep, tolerate sneezing/coughing, and go up/down stairs.  HAVE A BOWEL MOVEMENT DAILY Keep your bowels regular to avoid problems.  OK to try a laxative to override constipation.  OK to use an antidairrheal to slow down diarrhea.  Call if not better after 2 tries  CALL IF YOU HAVE PROBLEMS/CONCERNS Call if you are still struggling despite following these instructions. Call if you have concerns not answered by these instructions  ######################################################################    DIET: Follow a light bland diet & liquids the first 24 hours after arrival home, such as soup, liquids, starches, etc.  Be sure to drink plenty of fluids.  Quickly advance to a usual solid diet within a few days.  Avoid fast food or heavy meals as your are more likely to get nauseated or have irregular bowels.  A low-sugar, high-fiber diet for the rest of your life is ideal.   Take your usually prescribed home medications unless otherwise directed.  PAIN CONTROL: Pain is best controlled by a usual combination of three different methods TOGETHER: Ice/Heat Over the counter pain medication Prescription pain medication Most patients will experience some swelling and bruising around the hernia(s) such as the bellybutton, groins, or old incisions.  Ice packs or heating pads (30-60 minutes up to 6 times a day) will help. Use ice for the first few days to help decrease swelling and bruising, then switch to heat to help relax tight/sore spots and speed recovery.  Some people prefer to use ice alone, heat alone, alternating between ice & heat.  Experiment to what  works for you.  Swelling and bruising can take several weeks to resolve.   It is helpful to take an over-the-counter pain medication regularly for the first days: Naproxen (Aleve, etc)  Two 220mg  tabs twice a day OR Ibuprofen (Advil, etc) Three 200mg  tabs four times a day (every meal & bedtime) AND Acetaminophen (Tylenol, etc) 325-650mg  four times a day (every meal & bedtime) A  prescription for pain medication should be given to you upon discharge.  Take your pain medication as prescribed, IF NEEDED.  If you are having problems/concerns with the prescription medicine (does not control pain, nausea, vomiting, rash, itching, etc), please call us [phone] to see if we need to switch you to a different pain medicine that will work better for you and/or control your side effect better. If you need a refill on your pain medication, please contact your pharmacy.  They will contact our office to request authorization. Prescriptions will not be filled after 5 pm or on week-ends.  Avoid getting constipated.  Between the surgery and the pain medications, it is common to experience some constipation.  Increasing fluid intake and taking a fiber supplement (such as Metamucil, Citrucel, FiberCon, MiraLax, etc) 1-2 times a day regularly will usually help prevent this problem from occurring.  A mild laxative (prune juice, Milk of Magnesia, MiraLax, etc) should be taken according to package directions if there are no bowel movements after 48 hours.    Wash / shower as usual, starting 2 days after surgery.  You may shower over the  steri strips which are waterproof.  Let the soap and water run over the incisions, no rubbing, scrubbing, lotions or ointments to incisions until they are completely healed.  Do not submerge or soak  Remove your outer bandage and bandaids 2 days after surgery.  Steri-Strips will peel off on their own after 1 to 2 weeks.  You may leave the incision open to air.  You may replace a  dressing/Band-Aid to cover an incision for comfort if you wish.  Continue to shower over incision(s) after the dressing is off. Wear your abdominal binder at all times for the first week after surgery.  After this, wear it whenever you are up and moving around to help support your abdominal wall in the area of the incision.  The binder needs to be around the area of the incision, not higher or lower.  This will help with pain as well.  ACTIVITIES as tolerated:   You may resume regular (light) daily activities beginning the next day--such as daily self-care, walking, climbing stairs--gradually increasing activities as tolerated.  Control your pain so that you can walk an hour a day.  If you can walk 30 minutes without difficulty, it is safe to try more intense activity such as jogging, treadmill, bicycling, low-impact aerobics, swimming, etc. Refrain from the most intensive and strenuous activity such as sit-ups, heavy lifting, contact sports, etc  Refrain from any heavy lifting or straining until 6 weeks after surgery.   DO NOT PUSH THROUGH PAIN.  Let pain be your guide: If it hurts to do something, don't do it.  Pain is your body warning you to avoid that activity for another week until the pain goes down. You may drive when you are no longer taking prescription pain medication, you can comfortably wear a seatbelt, and you can safely maneuver your car and apply brakes. You may have sexual intercourse when it is comfortable.   FOLLOW UP in our office Please call [facility] at [phone] to set up an appointment to see your surgeon in the office for a follow-up appointment approximately 2-3 weeks after your surgery. Make sure that you call for this appointment the day you arrive home to insure a convenient appointment time.  9.  If you have disability of FMLA / Family leave forms, please bring the forms to the office for processing.  (do not give to your surgeon).  WHEN TO CALL us [phone]: Poor  pain control Reactions / problems with new medications (rash/itching, nausea, etc)  Fever over 101.5 F (38.5 C) Inability to urinate Nausea and/or vomiting Worsening swelling or bruising Continued bleeding from incision. Increased pain, redness, or drainage from the incision   The clinic staff is available to answer your questions during regular business hours (8:30am-5pm).  Please don't hesitate to call and ask to speak to one of our nurses for clinical concerns.   If you have a medical emergency, go to the nearest emergency room or call 911.  A surgeon from [facility] is always on call at the hospitals in [facility], [place], [place], [place], [place]  [address] ?  [address] [address], [place], [place]   [address] MAIN: [phone] ? TOLL FREE: [phone] ? FAX: [phone] [email or web address]

## 2021-04-21 NOTE — Anesthesia Procedure Notes (Signed)
Procedure Name: Intubation Date/Time: 04/21/2021 7:35 AM Performed by: Rosaland Lao, CRNA Pre-anesthesia Checklist: Patient identified, Emergency Drugs available, Suction available and Patient being monitored Patient Re-evaluated:Patient Re-evaluated prior to induction Oxygen Delivery Method: Circle system utilized Preoxygenation: Pre-oxygenation with 100% oxygen Induction Type: IV induction Ventilation: Mask ventilation without difficulty Laryngoscope Size: Miller and 2 Grade View: Grade I Tube type: Oral Tube size: 7.0 mm Number of attempts: 1 Airway Equipment and Method: Stylet Placement Confirmation: ETT inserted through vocal cords under direct vision, positive ETCO2 and breath sounds checked- equal and bilateral Secured at: 21 cm Tube secured with: Tape Dental Injury: Teeth and Oropharynx as per pre-operative assessment

## 2021-04-21 NOTE — Interval H&P Note (Signed)
History and Physical Interval Note:  04/21/2021 7:01 AM  Theresa Frazier  has presented today for surgery, with the diagnosis of INCISIONAL HERNIA.  The various methods of treatment have been discussed with the patient and family. After consideration of risks, benefits and other options for treatment, the patient has consented to  Procedure(s): [place] (N/A) as a surgical intervention.  The patient's history has been reviewed, patient examined, no change in status, stable for surgery.  I have reviewed the patient's chart and labs.  Questions were answered to the patient's satisfaction.     Kimm Sider Rich Brave

## 2021-04-21 NOTE — Transfer of Care (Signed)
Immediate Anesthesia Transfer of Care Note  Patient: Theresa Frazier  Procedure(s) Performed: LAPAROSCOPIC INCISIONAL HERNIA WITH MESH  Patient Location: PACU  Anesthesia Type:General  Level of Consciousness: awake, alert  and oriented  Airway & Oxygen Therapy: Patient Spontanous Breathing and Patient connected to face mask  Post-op Assessment: Report given to RN and Post -op Vital signs reviewed and stable  Post vital signs: Reviewed and stable  Last Vitals:  Vitals Value Taken Time  BP 132/54 04/21/21 0930  Temp    Pulse 81 04/21/21 0930  Resp 13 04/21/21 0930  SpO2 100 % 04/21/21 0930  Vitals shown include unvalidated device data.  Last Pain:  Vitals:   04/21/21 0624  TempSrc: Oral  PainSc:          Complications: No notable events documented.

## 2021-04-21 NOTE — Interval H&P Note (Signed)
History and Physical Interval Note:  04/21/2021 7:00 AM  Theresa Frazier  has presented today for surgery, with the diagnosis of INCISIONAL HERNIA.  The various methods of treatment have been discussed with the patient and family. After consideration of risks, benefits and other options for treatment, the patient has consented to  Procedure(s): [place] (N/A) as a surgical intervention.  The patient's history has been reviewed, patient examined, no change in status, stable for surgery.  I have reviewed the patient's chart and labs.  Questions were answered to the patient's satisfaction.     Lometa Riggin Rich Brave

## 2021-04-21 NOTE — Op Note (Signed)
Operative Note  AHAVA KISSOON  [id number]  [id number]  04/21/2021   Surgeon: Romana Juniper MD   Procedure performed: laparoscopic assisted incisional hernia repair with mesh underlay   Preop diagnosis: incisional hernia Post-op diagnosis/intraop findings: same, swiss cheese within lower midline diastasis, total defects 3x8cm   Specimens: no Retained items: no  EBL: minimal cc Complications: none   Description of procedure: After obtaining informed consent the patient was taken to the operating room and placed supine on operating room table where general endotracheal anesthesia was initiated, preoperative antibiotics were administered, SCDs applied, and a formal timeout was performed.  The abdomen was prepped and draped in usual sterile fashion.  Peritoneal access was gained using optical entry in the left upper quadrant and insufflation to 15 mmHg ensued without incident.  On gross inspection there is no injury from our entry or intra-abdominal abnormalities.  The lower midline hernia is visualized and additionally noted Swiss cheese type defect superiorly and inferiorly to this with small peritoneal eventration/hernia sacs.  Inferiorly the medial umbilical ligament and peritoneum were taken off the abdominal wall several centimeters with the harmonic scalpel to provide a landing zone for the mesh.  A small lower midline incision was made and the soft tissue dissected with cautery until the hernia sacs were encountered and these were circumferentially dissected out and skeletonized down to the level of fascia where they were excised.  The main hernia defect was 3 cm x 3 cm but additionally lengthwise approximately total 8 cm cephalocaudad of Swiss cheese type defects were identified.  A 4 inch x 6 inch ventralight mesh was selected, the rough side marked for orientation and stay sutures of 0 Ethibond placed in the 4 cardinal directions.  This was inserted into the abdominal cavity.  The fascial  defect was then closed vertically with interrupted figure-of-eight #1 Novafil's.  The abdomen was then reinsufflated and the closure inspected.  This is airtight with no entrapped structures.  After confirming that the rough side of the mesh faced the abdominal wall and the coated/smooth side facing the viscera, using the PMI, the previously placed stay sutures on the mesh were brought through the abdominal wall for transfascial fixation in the 4 cardinal directions and tied down.  There was excellent overlap of the hernia defects.  The secure strap tacker was then used to further affix the mesh to the abdominal wall with an outer and inner crown of tacs.  The previously dissected medial umbilical ligament and peritoneum was brought back up over the mesh inferiorly and reapposed using the secure strap tacker.  On completion the mesh is flush against the abdominal wall with no exposed rough surface.  The abdominal cavity was inspected and hemostasis was excellent.  There is really no omentum to bring down over the small bowel.  The abdomen was then desufflated and the trochars removed.  Interrupted 0 Vicryl's were placed in the deeper soft tissues along the midline incision and then all skin incisions were closed with subcuticular 4-0 Monocryl followed by benzoin, Steri-Strips and sterile dressings.  An abdominal binder was placed.  The patient was then awakened, extubated and taken to PACU in stable condition.    All counts were correct at the completion of the case.

## 2021-04-21 NOTE — Anesthesia Postprocedure Evaluation (Signed)
Anesthesia Post Note  Patient: Theresa Frazier  Procedure(s) Performed: LAPAROSCOPIC INCISIONAL HERNIA WITH MESH     Patient location during evaluation: PACU Anesthesia Type: General Level of consciousness: awake and alert Pain management: pain level controlled Vital Signs Assessment: post-procedure vital signs reviewed and stable Respiratory status: spontaneous breathing, nonlabored ventilation, respiratory function stable and patient connected to nasal cannula oxygen Cardiovascular status: blood pressure returned to baseline and stable Postop Assessment: no apparent nausea or vomiting Anesthetic complications: no   No notable events documented.  Last Vitals:  Vitals:   04/21/21 1215 04/21/21 1230  BP: 114/67 114/67  Pulse:    Resp:    Temp:    SpO2:      Last Pain:  Vitals:   04/21/21 1145  TempSrc:   PainSc: 0-No pain                 Barnet Glasgow

## 2021-04-23 ENCOUNTER — Encounter (HOSPITAL_COMMUNITY): Payer: Self-pay | Admitting: Surgery

## 2021-04-26 ENCOUNTER — Other Ambulatory Visit: Payer: Self-pay | Admitting: Family Medicine

## 2021-04-26 DIAGNOSIS — Z1231 Encounter for screening mammogram for malignant neoplasm of breast: Secondary | ICD-10-CM

## 2021-05-05 ENCOUNTER — Encounter: Payer: Medicare Other | Admitting: Family Medicine

## 2021-05-05 DIAGNOSIS — Z23 Encounter for immunization: Secondary | ICD-10-CM | POA: Diagnosis not present

## 2021-05-05 DIAGNOSIS — B078 Other viral warts: Secondary | ICD-10-CM | POA: Diagnosis not present

## 2021-05-10 ENCOUNTER — Telehealth: Payer: Self-pay

## 2021-05-10 NOTE — Telephone Encounter (Signed)
Received call from Theresa Frazier regarding annual CEA and CA 125 checks. She is clarifying if she has to come to the cancer center or if her PCP could check this. Per Dr. Serita Grit last office note CEA and CA 125 should be checked annually. This can be scheduled with her PCP or [name], NP. Patient verbalized understanding. Patient states she has an appointment scheduled with her PCP on 05/19/21 and will have her check those labs. Dr. Serita Grit last office note with recommendations routed to Dr. Ethlyn Gallery. Instructed patient to call with any questions or concerns.

## 2021-05-16 ENCOUNTER — Telehealth: Payer: Self-pay | Admitting: Family Medicine

## 2021-05-16 NOTE — Telephone Encounter (Signed)
Spoke with patient to schedule Medicare Annual Wellness Visit (AWV) either virtually or in office.   Patient declined will talk to dr Ethlyn Gallery regarding AWV  Theresa Frazier 04/04/21 per [place] please schedule at anytime with [facility] Nurse Health Advisor 1 or 2   This should be a 45 minute visit.

## 2021-05-19 ENCOUNTER — Encounter: Payer: Self-pay | Admitting: Family Medicine

## 2021-05-19 ENCOUNTER — Ambulatory Visit (INDEPENDENT_AMBULATORY_CARE_PROVIDER_SITE_OTHER): Payer: Medicare Other | Admitting: Family Medicine

## 2021-05-19 VITALS — BP 124/68 | HR 68 | Temp 97.7°F | Ht 62.25 in | Wt 170.8 lb

## 2021-05-19 DIAGNOSIS — M8580 Other specified disorders of bone density and structure, unspecified site: Secondary | ICD-10-CM

## 2021-05-19 DIAGNOSIS — E538 Deficiency of other specified B group vitamins: Secondary | ICD-10-CM | POA: Diagnosis not present

## 2021-05-19 DIAGNOSIS — Z0000 Encounter for general adult medical examination without abnormal findings: Secondary | ICD-10-CM

## 2021-05-19 DIAGNOSIS — E559 Vitamin D deficiency, unspecified: Secondary | ICD-10-CM | POA: Diagnosis not present

## 2021-05-19 DIAGNOSIS — E782 Mixed hyperlipidemia: Secondary | ICD-10-CM | POA: Diagnosis not present

## 2021-05-19 DIAGNOSIS — C561 Malignant neoplasm of right ovary: Secondary | ICD-10-CM | POA: Diagnosis not present

## 2021-05-19 DIAGNOSIS — Z8249 Family history of ischemic heart disease and other diseases of the circulatory system: Secondary | ICD-10-CM | POA: Diagnosis not present

## 2021-05-19 DIAGNOSIS — N816 Rectocele: Secondary | ICD-10-CM

## 2021-05-19 DIAGNOSIS — E2839 Other primary ovarian failure: Secondary | ICD-10-CM

## 2021-05-19 LAB — COMPREHENSIVE METABOLIC PANEL
ALT: 14 U/L (ref 0–35)
AST: 14 U/L (ref 0–37)
Albumin: 4.2 g/dL (ref 3.5–5.2)
Alkaline Phosphatase: 82 U/L (ref 39–117)
BUN: 11 mg/dL (ref 6–23)
CO2: 28 mEq/L (ref 19–32)
Calcium: 9.5 mg/dL (ref 8.4–10.5)
Chloride: 103 mEq/L (ref 96–112)
Creatinine, Ser: 0.8 mg/dL (ref 0.40–1.20)
GFR: 76.96 mL/min (ref >60.00)
Glucose, Bld: 92 mg/dL (ref 70–99)
Potassium: 4 mEq/L (ref 3.5–5.1)
Sodium: 139 mEq/L (ref 135–145)
Total Bilirubin: 0.5 mg/dL (ref 0.2–1.2)
Total Protein: 6.8 g/dL (ref 6.0–8.3)

## 2021-05-19 LAB — VITAMIN D 25 HYDROXY (VIT D DEFICIENCY, FRACTURES): VITD: 27.38 ng/mL — ABNORMAL LOW (ref 30.00–100.00)

## 2021-05-19 LAB — CBC WITH DIFFERENTIAL/PLATELET
Basophils Absolute: 0 10*3/uL (ref 0.0–0.1)
Basophils Relative: 0.8 % (ref 0.0–3.0)
Eosinophils Absolute: 0.5 10*3/uL (ref 0.0–0.7)
Eosinophils Relative: 9.7 % — ABNORMAL HIGH (ref 0.0–5.0)
HCT: 41.4 % (ref 36.0–46.0)
Hemoglobin: 13.4 g/dL (ref 12.0–15.0)
Lymphocytes Relative: 33.5 % (ref 12.0–46.0)
Lymphs Abs: 1.8 10*3/uL (ref 0.7–4.0)
MCHC: 32.4 g/dL (ref 30.0–36.0)
MCV: 93.4 fl (ref 78.0–100.0)
Monocytes Absolute: 0.5 10*3/uL (ref 0.1–1.0)
Monocytes Relative: 8.7 % (ref 3.0–12.0)
Neutro Abs: 2.5 10*3/uL (ref 1.4–7.7)
Neutrophils Relative %: 47.3 % (ref 43.0–77.0)
Platelets: 230 10*3/uL (ref 150.0–400.0)
RBC: 4.43 Mil/uL (ref 3.87–5.11)
RDW: 13 % (ref 11.5–15.5)
WBC: 5.4 10*3/uL (ref 4.0–10.5)

## 2021-05-19 LAB — VITAMIN B12: Vitamin B-12: 267 pg/mL (ref 211–911)

## 2021-05-19 NOTE — Progress Notes (Signed)
Theresa Frazier DOB: 30-Apr-1956 Encounter date: 05/19/2021  This is a 65 y.o. female who presents for complete physical   History of present illness/Additional concerns: Last visit with me was 01/29/2020. Had hernia repair November 18th; healing up well.   Medicare is all over her about taking care of health. She is wondering about repeating dexa. Hx of osteopenia. Last dexa was 2017.   Follows regularly with dentist, eye doc.   Getting another grandbaby in February. Glad she is up to date with Tdap.   Had covid 9/18 and then had rebound covid. So she hasn't gotten new booster yet.   Follows with Dr. Denman George for hx of R ovarian cancer tx with surgery 05/2016. They follow with CEA and CA125 annually.  Follows with Dr. Tarri Glenn for GI care.  Colonoscopy completed 04/2021.  Repeat suggested 04/2028.  Mammogram scheduled for 06/12/2021.  Has regular eye exams with [facility] ophthalmology.  Follows regularly with dermatology, Dr. Renda Rolls. Following up for wart on face that they have been treating. Has been frozen 3-4 times. Just had re-treated earlier this month.  Past Medical History:  Diagnosis Date   Cancer [facility])    Chest pain    atypical, myoview 05/31/05-EF 75%, low risk   Dyslipidemia    Family history of early CAD    Genetic testing 09/28/2016   Genetic testing revealed ONE pathogenic mutation in [place]. Though a pathogenic mutation was identified, increased risk for cancer is only associated with TWO (biallelic) pathogenic mutations. No additional mutations or variants of uncertain significance were identified. Testing was performed through [employer]'s 46-gene Common Hereditary Cancers Panel. [facility]'s Common Hereditary Cancers Panel includ   GERD (gastroesophageal reflux disease)    Hyperlipidemia    diet controlled   Inguinal hernia    RIGHT side   Osteopenia    Ovarian cancer ([place]) 2017   Past Surgical History:  Procedure Laterality Date   ABDOMINAL HYSTERECTOMY Left  05/31/2016   Procedure: HYSTERECTOMY ABDOMINAL;  Surgeon: Everitt Amber, MD;  Location: [facility];  Service: Gynecology;  Laterality: Left;   BREAST SURGERY Right 1993   breast-calcification   COLONOSCOPY     INCISIONAL HERNIA REPAIR N/A 04/21/2021   Procedure: LAPAROSCOPIC INCISIONAL HERNIA WITH MESH;  Surgeon: Clovis Riley, MD;  Location: [facility];  Service: General;  Laterality: N/A;   LAPAROTOMY N/A 05/31/2016   Procedure: EXPLORATORY LAPAROTOMY;  Surgeon: Everitt Amber, MD;  Location: [facility];  Service: Gynecology;  Laterality: N/A;   OMENTECTOMY  05/31/2016   Procedure: OMENTECTOMY AND STAGING FOR OVARIAN CANCER;  Surgeon: Everitt Amber, MD;  Location: [facility];  Service: Gynecology;;   SALPINGOOPHORECTOMY Right 05/31/2016   Procedure: BILATERAL SALPINGO OOPHORECTOMY;  Surgeon: Everitt Amber, MD;  Location: [facility];  Service: Gynecology;  Laterality: Right;   TONSILLECTOMY     as a child   WISDOM TOOTH EXTRACTION  1975   No Known Allergies Current Meds  Medication Sig   Collagen Hydrolysate POWD Take 1 Scoop by mouth daily at 6 (six) AM.   fluticasone (FLONASE) 50 MCG/ACT nasal spray Place 1 spray into both nostrils daily. (Patient taking differently: Place 1 spray into both nostrils daily as needed for allergies.)   Multiple Vitamin (MULTIVITAMIN) tablet Take 1 tablet by mouth daily.   OVER THE COUNTER MEDICATION [place] sleep   Polyethyl Glycol-Propyl Glycol (SYSTANE OP) Place 1-2 drops into both eyes 2 (two) times daily.   pseudoephedrine (SUDAFED) 30 MG tablet Take 30 mg by mouth daily as needed for congestion.  Social History   Tobacco Use   Smoking status: Former    Packs/day: 0.25    Years: 7.00    Pack years: 1.75    Types: Cigarettes    Quit date: 11/07/1985    Years since quitting: 35.5   Smokeless tobacco: Never  Substance Use Topics   Alcohol use: No   Family History  Problem Relation Age of Onset   Pancreatic cancer Mother 71       d.73 - two months after diagnosis    Depression Mother    Diabetes Mother        at end of life   Hearing loss Father    Heart attack Father 8   Heart disease Father    Hypertension Sister    Arthritis Sister    Mental illness Sister        closed head injury as teen   Alcohol abuse Sister    Colon polyps Brother 29   Alcohol abuse Brother    Diabetes Brother    Arthritis Brother    Heart failure Brother 67   Atrial fibrillation Brother    Liver disease Brother    Alcohol abuse Maternal Grandmother    Drug abuse Maternal Grandfather    Heart attack Paternal Grandfather    Melanoma Daughter 75   Addison's disease Maternal Aunt 76   Leukemia Maternal Uncle 29       d.59 - shortly after diagnosis   Esophageal cancer Neg Hx    Rectal cancer Neg Hx    Stomach cancer Neg Hx    Colon cancer Neg Hx      Review of Systems  Constitutional:  Negative for activity change, appetite change, chills, fatigue, fever and unexpected weight change.  HENT:  Negative for congestion, ear pain, hearing loss, sinus pressure, sinus pain, sore throat and trouble swallowing.   Eyes:  Negative for pain and visual disturbance.  Respiratory:  Negative for cough, chest tightness, shortness of breath and wheezing.   Cardiovascular:  Negative for chest pain, palpitations and leg swelling.  Gastrointestinal:  Negative for abdominal pain, blood in stool, constipation, diarrhea, nausea and vomiting.  Genitourinary:  Negative for difficulty urinating and menstrual problem.  Musculoskeletal:  Negative for arthralgias and back pain.  Skin:  Negative for rash.  Neurological:  Negative for dizziness, weakness, numbness and headaches.  Hematological:  Negative for adenopathy. Does not bruise/bleed easily.  Psychiatric/Behavioral:  Negative for sleep disturbance and suicidal ideas. The patient is not nervous/anxious.    CBC:  Lab Results  Component Value Date   WBC 5.4 05/19/2021   HGB 13.4 05/19/2021   HCT 41.4 05/19/2021   MCH 30.8  04/18/2021   MCHC 32.4 05/19/2021   RDW 13.0 05/19/2021   PLT 230.0 05/19/2021   MPV 10.4 01/29/2020   CMP: Lab Results  Component Value Date   NA 139 05/19/2021   NA 143 05/06/2018   NA 141 11/21/2016   K 4.0 05/19/2021   K 4.0 11/21/2016   CL 103 05/19/2021   CO2 28 05/19/2021   CO2 26 11/21/2016   ANIONGAP 9 02/18/2021   GLUCOSE 92 05/19/2021   GLUCOSE 87 11/21/2016   BUN 11 05/19/2021   BUN 10 05/06/2018   BUN 10.7 11/21/2016   CREATININE 0.80 05/19/2021   CREATININE 0.74 01/29/2020   CREATININE 0.8 11/21/2016   LABGLOB 1.6 05/06/2018   GFRAA 94 05/06/2018   GFRAA >60 08/12/2017   CALCIUM 9.5 05/19/2021   CALCIUM 9.5 11/21/2016  PROT 6.8 05/19/2021   PROT 6.1 05/06/2018   AGRATIO 2.8 (H) 05/06/2018   BILITOT 0.5 05/19/2021   BILITOT 0.3 05/06/2018   ALKPHOS 82 05/19/2021   ALT 14 05/19/2021   AST 14 05/19/2021   LIPID: Lab Results  Component Value Date   CHOL 228 (H) 01/29/2020   CHOL 251 (H) 05/06/2018   TRIG 66 01/29/2020   HDL 59 01/29/2020   HDL 78 05/06/2018   LDLCALC 153 (H) 01/29/2020   LABVLDL 16 05/06/2018    Objective:  BP 124/68 (BP Location: Left Arm, Patient Position: Sitting, Cuff Size: Large)    Pulse 68    Temp 97.7 F (36.5 C) (Oral)    Ht 5' 2.25" (1.581 m)    Wt 170 lb 12.8 oz (77.5 kg)    SpO2 97%    BMI 30.99 kg/m   Weight: 170 lb 12.8 oz (77.5 kg)   BP Readings from Last 3 Encounters:  05/19/21 124/68  04/21/21 114/67  04/18/21 (!) 144/78   Wt Readings from Last 3 Encounters:  05/19/21 170 lb 12.8 oz (77.5 kg)  04/21/21 173 lb (78.5 kg)  04/18/21 173 lb (78.5 kg)    Physical Exam Constitutional:      General: She is not in acute distress.    Appearance: She is well-developed.  HENT:     Head: Normocephalic and atraumatic.     Right Ear: External ear normal.     Left Ear: External ear normal.     Mouth/Throat:     Pharynx: No oropharyngeal exudate.  Eyes:     Conjunctiva/sclera: Conjunctivae normal.     Pupils:  Pupils are equal, round, and reactive to light.  Neck:     Thyroid: No thyromegaly.  Cardiovascular:     Rate and Rhythm: Normal rate and regular rhythm.     Heart sounds: Normal heart sounds. No murmur heard.   No friction rub. No gallop.  Pulmonary:     Effort: Pulmonary effort is normal.     Breath sounds: Normal breath sounds.  Abdominal:     General: Bowel sounds are normal. There is no distension.     Palpations: Abdomen is soft. There is no mass.     Tenderness: There is no abdominal tenderness. There is no guarding.     Hernia: No hernia is present.  Musculoskeletal:        General: No tenderness or deformity. Normal range of motion.     Cervical back: Normal range of motion and neck supple.  Lymphadenopathy:     Cervical: No cervical adenopathy.  Skin:    General: Skin is warm and dry.     Findings: No rash.  Neurological:     Mental Status: She is alert and oriented to person, place, and time.     Deep Tendon Reflexes: Reflexes normal.     Reflex Scores:      Tricep reflexes are 2+ on the right side and 2+ on the left side.      Bicep reflexes are 2+ on the right side and 2+ on the left side.      Brachioradialis reflexes are 2+ on the right side and 2+ on the left side.      Patellar reflexes are 2+ on the right side and 2+ on the left side. Psychiatric:        Speech: Speech normal.        Behavior: Behavior normal.        Thought Content:  Thought content normal.    Assessment/Plan: Health Maintenance Due  Topic Date Due   Pneumonia Vaccine 9+ Years old (1 - PCV) Never done   Health Maintenance reviewed.  1. Preventative health care She will be getting back on track with regular exercise once she is healed up from recent surgery.  Keep up with healthy eating.  2. Mixed hyperlipidemia Discussed preventative care for her.  She worries that history of heart disease.  Interested after discussion and getting CT coronary calcium screening as well as further  evaluation with NMR LipoProfile. - CT CARDIAC SCORING (SELF PAY ONLY); Future - NMR, lipoprofile; Future - Comprehensive metabolic panel; Future - Comprehensive metabolic panel - NMR, lipoprofile  3. Ovarian cancer on right [facility]) Has followed with Dr. Denman George.  We will recheck CEA and CA125 today.  She is released for yearly lab follow-up through gyn onc.  - CBC with Differential/Platelet; Future - CEA; Future - CA 125; Future - CA 125 - CEA - CBC with Differential/Platelet  4. Estrogen deficiency - DG Bone Density; Future  5. Osteopenia, unspecified location - DG Bone Density; Future  6. Family history of early CAD - CT CARDIAC SCORING (SELF PAY ONLY); Future - NMR, lipoprofile; Future - NMR, lipoprofile  7. Vitamin D deficiency - VITAMIN D 25 Hydroxy (Vit-D Deficiency, Fractures); Future - VITAMIN D 25 Hydroxy (Vit-D Deficiency, Fractures)  8. Rectocele - Ambulatory referral to Physical Therapy - she is interested in pelvic floor therapy.   9. Low serum vitamin B12 - Vitamin B12; Future - Vitamin B12  Return for pending bloodwork.  Micheline Rough, MD

## 2021-05-19 NOTE — Patient Instructions (Addendum)
Consider prevnar 20 at office or pharmacy.   Call [facility] to see if they can do DEXA at same time as mammogram.

## 2021-05-20 LAB — NMR, LIPOPROFILE
Cholesterol, Total: 222 mg/dL — ABNORMAL HIGH (ref 100–199)
HDL Particle Number: 34.7 umol/L (ref >=30.5)
HDL-C: 65 mg/dL (ref >39)
LDL Particle Number: 1683 nmol/L — ABNORMAL HIGH (ref <1000)
LDL Size: 21 nm (ref >20.5)
LDL-C (NIH Calc): 146 mg/dL — ABNORMAL HIGH (ref 0–99)
LP-IR Score: <25 (ref <=45)
Small LDL Particle Number: 540 nmol/L — ABNORMAL HIGH (ref <=527)
Triglycerides: 65 mg/dL (ref 0–149)

## 2021-05-22 LAB — CA 125: CA 125: 13 U/mL

## 2021-05-22 LAB — CEA: CEA: <0.5 ng/mL

## 2021-05-24 ENCOUNTER — Encounter: Payer: Self-pay | Admitting: Family Medicine

## 2021-05-30 ENCOUNTER — Encounter: Payer: Self-pay | Admitting: Physical Therapy

## 2021-05-30 ENCOUNTER — Ambulatory Visit: Payer: Medicare Other | Attending: Family Medicine | Admitting: Physical Therapy

## 2021-05-30 ENCOUNTER — Other Ambulatory Visit: Payer: Self-pay

## 2021-05-30 DIAGNOSIS — M6281 Muscle weakness (generalized): Secondary | ICD-10-CM | POA: Diagnosis not present

## 2021-05-30 DIAGNOSIS — N816 Rectocele: Secondary | ICD-10-CM | POA: Diagnosis not present

## 2021-05-30 DIAGNOSIS — R293 Abnormal posture: Secondary | ICD-10-CM | POA: Diagnosis not present

## 2021-05-30 NOTE — Therapy (Signed)
[facility] @ [address] [place], [place], [address] Phone: [phone]   Fax:  [phone]  Physical Therapy Evaluation  Patient Details  Name: Theresa Frazier MRN: [id number] Date of Birth: [birth year] Referring Provider (PT): Caren Macadam, MD   Encounter Date: 05/30/2021   PT End of Session - 05/30/21 0930     Visit Number 1    Date for PT Re-Evaluation 08/22/21    Authorization Type BCBS    PT Start Time 0800    PT Stop Time 0846    PT Time Calculation (min) 46 min    Activity Tolerance Patient tolerated treatment well    Behavior During Therapy [facility] for tasks assessed/performed             Past Medical History:  Diagnosis Date   Cancer ([place])    Chest pain    atypical, myoview 05/31/05-EF 75%, low risk   Dyslipidemia    Family history of early CAD    Genetic testing 09/28/2016   Genetic testing revealed ONE pathogenic mutation in [place]. Though a pathogenic mutation was identified, increased risk for cancer is only associated with TWO (biallelic) pathogenic mutations. No additional mutations or variants of uncertain significance were identified. Testing was performed through [employer]'s 46-gene Common Hereditary Cancers Panel. [employer]'s Common Hereditary Cancers Panel includ   GERD (gastroesophageal reflux disease)    Hyperlipidemia    diet controlled   Inguinal hernia    RIGHT side   Osteopenia    Ovarian cancer ([place]) 2017    Past Surgical History:  Procedure Laterality Date   ABDOMINAL HYSTERECTOMY Left 05/31/2016   Procedure: HYSTERECTOMY ABDOMINAL;  Surgeon: Everitt Amber, MD;  [facility]: [facility];  Service: Gynecology;  Laterality: Left;   BREAST SURGERY Right 1993   breast-calcification   COLONOSCOPY     INCISIONAL HERNIA REPAIR N/A 04/21/2021   Procedure: LAPAROSCOPIC INCISIONAL HERNIA WITH MESH;  Surgeon: Clovis Riley, MD;  [facility];  Service: General;  Laterality: N/A;    LAPAROTOMY N/A 05/31/2016   Procedure: EXPLORATORY LAPAROTOMY;  Surgeon: Everitt Amber, MD;  [facility];  Service: Gynecology;  Laterality: N/A;   OMENTECTOMY  05/31/2016   Procedure: OMENTECTOMY AND STAGING FOR OVARIAN CANCER;  Surgeon: Everitt Amber, MD;  [facility];  Service: Gynecology;;   SALPINGOOPHORECTOMY Right 05/31/2016   Procedure: BILATERAL SALPINGO OOPHORECTOMY;  Surgeon: Everitt Amber, MD;  [facility];  Service: Gynecology;  Laterality: Right;   TONSILLECTOMY     as a child   [place]    There were no vitals filed for this visit.    Subjective Assessment - 05/30/21 0805     Subjective Pt had leakage since the hysterectomy and hernia repair and wants to know how to do the normal things she wants to get back to doing. I have leakage all the time now.  I use the underwear that has pads in it.  I have urgency and that is when I have it.  I drink a lot of water in the morning plus I 30 oz per day.  Plus tea throughout the day.  I go from sitting to standing and have leakage then also.    Patient Stated Goals I want to be able to do the inversion table, yoga and tai chi, spin class    Currently in Pain? No/denies                [facility] PT  Assessment - 05/30/21 0001       Assessment   Medical Diagnosis N81.6 (ICD-10-CM) - Rectocele    Referring Provider (PT) Caren Macadam, MD    Prior Therapy No      Balance Screen   Has the patient fallen in the past 6 months No      [place] residence    Living Arrangements Spouse/significant other      Prior Function   Level of Independence Independent    Vocation Full time employment      Cognition   Overall Cognitive Status Within Functional Limits for tasks assessed      Posture/Postural Control   Posture Comments leans to the Rt side      ROM / Strength   AROM / PROM / Strength PROM      PROM   Overall PROM Comments Rt hip 60%       Flexibility   Soft Tissue Assessment /Muscle Length yes    Hamstrings rt 80%      Palpation   Palpation comment weakness in abdominal wall; incision restricted with adhesions on the Rt side more than left      Ambulation/Gait   Gait Pattern Within Functional Limits                        Objective measurements completed on examination: See above findings.     Pelvic Floor Special Questions - 05/30/21 0001     Are you Pregnant or attempting pregnancy? No    Prior Pregnancies Yes    Number of Vaginal Deliveries 2    Currently Sexually Active Yes    Is this Painful No    Urinary Leakage Yes    Pad use 1/day but sometimes may need an extra    Activities that cause leaking With strong urge    Urinary urgency Yes    Urinary frequency normal    Fecal incontinence No    Fluid intake 40-60 oz    Falling out feeling (prolapse) Yes   I feel a bulge when touching   Perineal Body/Introitus  Gaping    Prolapse Posterior Wall    Pelvic Floor Internal Exam Pt identity confirmed and informed consent given    Exam Type Vaginal    Strength fair squeeze, definite lift    Strength # of reps 2    Strength # of seconds 15    Tone higher on the Rt                         PT Short Term Goals - 05/30/21 0919       PT SHORT TERM GOAL #1   Title ind with urge drills    Time 4    Period Weeks    Status New    Target Date 06/27/21               PT Long Term Goals - 05/30/21 0846       PT LONG TERM GOAL #1   Title Pt will be independent with advanced HEP    Time 12    Period Weeks    Status New    Target Date 08/22/21      PT LONG TERM GOAL #2   Title Pt will report 50% less urgency and has not been having leakage    Time 12    Period Weeks  Status New    Target Date 08/22/21      PT LONG TERM GOAL #3   Title Pt will report no pain in abdomen during or after doing exercise    Baseline feels irritated    Time 12    Period Weeks    Status  New    Target Date 08/22/21      PT LONG TERM GOAL #4   Title Pt will be able to engage her core correctly for doing housework without increased pain or prolapse symptoms    Time 12    Period Weeks    Status New    Target Date 08/22/21      PT LONG TERM GOAL #5   Title .Marland KitchenMarland Kitchen                    Plan - 05/30/21 0904     Clinical Impression Statement Pt presents to clinic today due to rectocele that has been worsening since her hysterectomy as well as abdominal discomfort with certain activities since her hernia repair.  Pt also developed bladder leakage since the hysterectomy. Pt has MMT of 3/5 strength in the pelvic floor assessment.  Pt was able to hold contraction for 15 seconds and did 10 quick flicks.  Pt has adhesions along the Rt side of the abdominal incision.  She also has tension in the Rt hip and hamstrings.  Pt has decreased tone in the abdomen.  Pt will benefit from skilled PT to address these limitations and be able to participate in all functional and recreational activities.    Personal Factors and Comorbidities Comorbidity 3+    Comorbidities hx: hysterectomy, hernia repair with mesh, ovarian cancer 2017    Examination-Activity Limitations Continence    Examination-Participation Restrictions Community Activity    Stability/Clinical Decision Making Evolving/Moderate complexity    Clinical Decision Making Moderate    Rehab Potential Excellent    PT Frequency 1x / week    PT Duration 12 weeks    PT Treatment/Interventions ADLs/Self Care Home Management;Biofeedback;Cryotherapy;Electrical Stimulation;Moist Heat;Therapeutic exercise;Therapeutic activities;Neuromuscular re-education;Patient/family education;Manual techniques;Dry needling;Passive range of motion;Taping    PT Next Visit Plan abdominal fascial release and core strengthe; review urge if needed    PT Home Exercise Plan urge techniques    Consulted and Agree with Plan of Care Patient              Patient will benefit from skilled therapeutic intervention in order to improve the following deficits and impairments:  Pain, Postural dysfunction, Decreased strength, Impaired tone, Increased fascial restricitons, Decreased range of motion  Visit Diagnosis: Muscle weakness (generalized)  Abnormal posture     Problem List Patient Active Problem List   Diagnosis Date Noted   S/P hernia surgery 04/21/2021   Sleep disturbance 05/06/2018   Ventral hernia with obstruction and without gangrene 02/22/2017   Rectocele 11/19/2016   Nodule of apex of right lung 11/19/2016   Genetic testing 09/28/2016   Ovarian cancer on right ([place]) 05/31/2016   Ovarian cancer ([place]) 05/31/2016   Pelvic mass 05/14/2016   Hyperlipidemia 11/07/2012   Fatigue 11/07/2012   Family history of early CAD 11/07/2012    Camillo Flaming Natsha Guidry, PT 05/30/2021, 9:31 AM  [facility] @ [place], [place], [address] Phone: [phone]   Fax:  [phone]  Name: Theresa Frazier MRN: [id number] Date of Birth: 1955-08-02

## 2021-06-12 ENCOUNTER — Ambulatory Visit
Admission: RE | Admit: 2021-06-12 | Discharge: 2021-06-12 | Disposition: A | Discharge status: Home / Self Care | Payer: Medicare Other | Source: Ambulatory Visit | Attending: Family Medicine | Admitting: Family Medicine

## 2021-06-12 DIAGNOSIS — Z1231 Encounter for screening mammogram for malignant neoplasm of breast: Secondary | ICD-10-CM | POA: Diagnosis not present

## 2021-06-15 ENCOUNTER — Ambulatory Visit: Payer: Medicare Other | Attending: Family Medicine | Admitting: Physical Therapy

## 2021-06-15 ENCOUNTER — Other Ambulatory Visit: Payer: Self-pay

## 2021-06-15 ENCOUNTER — Encounter: Payer: Self-pay | Admitting: Physical Therapy

## 2021-06-15 DIAGNOSIS — R293 Abnormal posture: Secondary | ICD-10-CM | POA: Insufficient documentation

## 2021-06-15 DIAGNOSIS — M6281 Muscle weakness (generalized): Secondary | ICD-10-CM | POA: Insufficient documentation

## 2021-06-15 NOTE — Patient Instructions (Signed)
Access Code: [email or web address] URL: [email or web address] Date: 06/15/2021 Prepared by: Jari Favre  Exercises Supine March - 1 x daily - 7 x weekly - 3 sets - 10 reps

## 2021-06-15 NOTE — Therapy (Signed)
[facility] @ [place], [place], [address] Phone: [phone]   Fax:  [phone]  Physical Therapy Treatment  Patient Details  Name: Theresa Frazier MRN: [id number] Date of Birth: 20-May-1956 Referring Provider (PT): Caren Macadam, MD   Encounter Date: 06/15/2021   PT End of Session - 06/15/21 1157     Visit Number 2    Date for PT Re-Evaluation 08/22/21    Authorization Type BCBS    PT Start Time 1153    PT Stop Time 1234    PT Time Calculation (min) 41 min    Activity Tolerance Patient tolerated treatment well    Behavior During Therapy [facility] for tasks assessed/performed             Past Medical History:  Diagnosis Date   Cancer ([place])    Chest pain    atypical, myoview 05/31/05-EF 75%, low risk   Dyslipidemia    Family history of early CAD    Genetic testing 09/28/2016   Genetic testing revealed ONE pathogenic mutation in [place]. Though a pathogenic mutation was identified, increased risk for cancer is only associated with TWO (biallelic) pathogenic mutations. No additional mutations or variants of uncertain significance were identified. Testing was performed through [employer]'s 46-gene Common Hereditary Cancers Panel. [employer]'s Common Hereditary Cancers Panel includ   GERD (gastroesophageal reflux disease)    Hyperlipidemia    diet controlled   Inguinal hernia    RIGHT side   Osteopenia    Ovarian cancer ([place]) 2017    Past Surgical History:  Procedure Laterality Date   ABDOMINAL HYSTERECTOMY Left 05/31/2016   Procedure: HYSTERECTOMY ABDOMINAL;  Surgeon: Everitt Amber, MD;  [facility];  Service: Gynecology;  Laterality: Left;   BREAST SURGERY Right 1993   breast-calcification   COLONOSCOPY     INCISIONAL HERNIA REPAIR N/A 04/21/2021   Procedure: LAPAROSCOPIC INCISIONAL HERNIA WITH MESH;  Surgeon: Clovis Riley, MD;  [facility];  Service: General;  Laterality: N/A;   LAPAROTOMY  N/A 05/31/2016   Procedure: EXPLORATORY LAPAROTOMY;  Surgeon: Everitt Amber, MD;  [facility];  Service: Gynecology;  Laterality: N/A;   OMENTECTOMY  05/31/2016   Procedure: OMENTECTOMY AND STAGING FOR OVARIAN CANCER;  Surgeon: Everitt Amber, MD;  [facility];  Service: Gynecology;;   SALPINGOOPHORECTOMY Right 05/31/2016   Procedure: BILATERAL SALPINGO OOPHORECTOMY;  Surgeon: Everitt Amber, MD;  [facility];  Service: Gynecology;  Laterality: Right;   TONSILLECTOMY     as a child   [place]    There were no vitals filed for this visit.   Subjective Assessment - 06/15/21 1159     Subjective Pt states stopping with the urge is helping the leakage.  Pt states the stool is helping for BMs.  Pt reports a lot of things    Currently in Pain? No/denies                               [facility] Adult PT Treatment/Exercise - 06/15/21 0001       Self-Care   Self-Care Other Self-Care Comments    Other Self-Care Comments  clarification of urge techniques      Exercises   Exercises Lumbar      Lumbar Exercises: Supine   Heel Slides 10 reps    Bent Knee Raise 10 reps      Manual Therapy  Manual Therapy Myofascial release    Myofascial Release abdominal got release on Rt side of scar and around bladder                     PT Education - 06/15/21 1349     Education Details Access Code: [email or web address]    Person(s) Educated Patient    Methods Explanation;Demonstration;Tactile cues;Verbal cues;Handout    Comprehension Returned demonstration;Verbalized understanding              PT Short Term Goals - 06/15/21 1159       PT SHORT TERM GOAL #1   Title ind with urge drills    Status Achieved               PT Long Term Goals - 05/30/21 0846       PT LONG TERM GOAL #1   Title Pt will be independent with advanced HEP    Time 12    Period Weeks    Status New    Target Date 08/22/21      PT LONG TERM GOAL #2   Title Pt  will report 50% less urgency and has not been having leakage    Time 12    Period Weeks    Status New    Target Date 08/22/21      PT LONG TERM GOAL #3   Title Pt will report no pain in abdomen during or after doing exercise    Baseline feels irritated    Time 12    Period Weeks    Status New    Target Date 08/22/21      PT LONG TERM GOAL #4   Title Pt will be able to engage her core correctly for doing housework without increased pain or prolapse symptoms    Time 12    Period Weeks    Status New    Target Date 08/22/21      PT LONG TERM GOAL #5   Title .Marland KitchenMarland Kitchen                   Plan - 06/15/21 1341     Clinical Impression Statement Today's session focused on clarification of using urge techniques more effectively although she has already gotten some good results  She is still having small amounts of leakage.  Pt session also focused on fascial release and beginning gentle core strengthening.  Pt will benefit from skilled PT to continue to address pain and muscle weaknesses.    PT Treatment/Interventions ADLs/Self Care Home Management;Biofeedback;Cryotherapy;Electrical Stimulation;Moist Heat;Therapeutic exercise;Therapeutic activities;Neuromuscular re-education;Patient/family education;Manual techniques;Dry needling;Passive range of motion;Taping    PT Next Visit Plan f/u on abdominal fascial release and core strength; pelvic bulge and contract in supine and on ball    PT Home Exercise Plan urge techniques    Consulted and Agree with Plan of Care Patient             Patient will benefit from skilled therapeutic intervention in order to improve the following deficits and impairments:  Pain, Postural dysfunction, Decreased strength, Impaired tone, Increased fascial restricitons, Decreased range of motion  Visit Diagnosis: Muscle weakness (generalized)  Abnormal posture     Problem List Patient Active Problem List   Diagnosis Date Noted   S/P hernia surgery  04/21/2021   Sleep disturbance 05/06/2018   Ventral hernia with obstruction and without gangrene 02/22/2017   Rectocele 11/19/2016   Nodule of apex of right lung 11/19/2016   Genetic testing  09/28/2016   Ovarian cancer on right ([place]) 05/31/2016   Ovarian cancer ([place]) 05/31/2016   Pelvic mass 05/14/2016   Hyperlipidemia 11/07/2012   Fatigue 11/07/2012   Family history of early CAD 11/07/2012    Jule Ser, PT 06/15/2021, 1:51 PM  [facility] @ [place], [place], [address] Phone: [phone]   Fax:  [phone]  Name: Theresa Frazier MRN: [id number] Date of Birth: 02/24/1956

## 2021-06-22 ENCOUNTER — Encounter: Payer: Medicare Other | Admitting: Physical Therapy

## 2021-06-23 ENCOUNTER — Other Ambulatory Visit: Payer: Self-pay

## 2021-06-23 ENCOUNTER — Encounter: Payer: Self-pay | Admitting: Physical Therapy

## 2021-06-23 ENCOUNTER — Ambulatory Visit: Payer: Medicare Other | Admitting: Physical Therapy

## 2021-06-23 DIAGNOSIS — R293 Abnormal posture: Secondary | ICD-10-CM | POA: Diagnosis not present

## 2021-06-23 DIAGNOSIS — M6281 Muscle weakness (generalized): Secondary | ICD-10-CM

## 2021-06-23 NOTE — Therapy (Signed)
[facility] @ [place], [place], [address] Phone: [phone]   Fax:  [phone]  Physical Therapy Treatment  Patient Details  Name: Theresa Frazier MRN: [id number] Date of Birth: [birth year] Referring Provider (PT): Caren Macadam, MD   Encounter Date: 06/23/2021   PT End of Session - 06/23/21 0853     Visit Number 3    Date for PT Re-Evaluation 08/22/21    Authorization Type BCBS    PT Start Time 0848    PT Stop Time 0928    PT Time Calculation (min) 40 min    Activity Tolerance Patient tolerated treatment well    Behavior During Therapy [facility] for tasks assessed/performed             Past Medical History:  Diagnosis Date   Cancer ([place])    Chest pain    atypical, myoview 05/31/05-EF 75%, low risk   Dyslipidemia    Family history of early CAD    Genetic testing 09/28/2016   Genetic testing revealed ONE pathogenic mutation in [place]. Though a pathogenic mutation was identified, increased risk for cancer is only associated with TWO (biallelic) pathogenic mutations. No additional mutations or variants of uncertain significance were identified. Testing was performed through [employer]'s 46-gene Common Hereditary Cancers Panel. [employer]'s Common Hereditary Cancers Panel includ   GERD (gastroesophageal reflux disease)    Hyperlipidemia    diet controlled   Inguinal hernia    RIGHT side   Osteopenia    Ovarian cancer ([place]) 2017    Past Surgical History:  Procedure Laterality Date   ABDOMINAL HYSTERECTOMY Left 05/31/2016   Procedure: HYSTERECTOMY ABDOMINAL;  Surgeon: Everitt Amber, MD;  [facility];  Service: Gynecology;  Laterality: Left;   BREAST SURGERY Right 1993   breast-calcification   COLONOSCOPY     INCISIONAL HERNIA REPAIR N/A 04/21/2021   Procedure: LAPAROSCOPIC INCISIONAL HERNIA WITH MESH;  Surgeon: Clovis Riley, MD;  [facility];  Service: General;  Laterality: N/A;   LAPAROTOMY  N/A 05/31/2016   Procedure: EXPLORATORY LAPAROTOMY;  Surgeon: Everitt Amber, MD;  [facility];  Service: Gynecology;  Laterality: N/A;   OMENTECTOMY  05/31/2016   Procedure: OMENTECTOMY AND STAGING FOR OVARIAN CANCER;  Surgeon: Everitt Amber, MD;  [facility];  Service: Gynecology;;   SALPINGOOPHORECTOMY Right 05/31/2016   Procedure: BILATERAL SALPINGO OOPHORECTOMY;  Surgeon: Everitt Amber, MD;  [facility];  Service: Gynecology;  Laterality: Right;   TONSILLECTOMY     as a child   [place]    There were no vitals filed for this visit.   Subjective Assessment - 06/23/21 0856     Subjective Pt states BM are okay but was traveling.  I wasn't hydrating during the day and did at night.    Patient Stated Goals I want to be able to do the inversion table, yoga and tai chi, spin class    Currently in Pain? No/denies                               [facility] Adult PT Treatment/Exercise - 06/23/21 0001       Neuro Re-ed    Neuro Re-ed Details  bulging pelvic floor; breathing and engage with movements      Lumbar Exercises: Seated   Hip Flexion on Ball Strengthening;Right;Left;10 reps    Hip Flexion on Ball Limitations using exhale and  kegel with march    Other Seated Lumbar Exercises circles and breathing    Other Seated Lumbar Exercises lifting 3 lb on ball      Lumbar Exercises: Quadruped   Single Arm Raise Right;Left;5 reps    Opposite Arm/Leg Raise Right arm/Left leg;Left arm/Right leg;5 reps    Other Quadruped Lumbar Exercises child pose side bending and threading rotations                     PT Education - 06/23/21 0931     Education Details Access Code: [email or web address]    Person(s) Educated Patient    Methods Explanation;Demonstration;Tactile cues;Verbal cues;Handout    Comprehension Verbalized understanding              PT Short Term Goals - 06/15/21 1159       PT SHORT TERM GOAL #1   Title ind with urge drills     Status Achieved               PT Long Term Goals - 06/23/21 0859       PT LONG TERM GOAL #1   Title Pt will be independent with advanced HEP    Status On-going      PT LONG TERM GOAL #2   Title Pt will report 50% less urgency and has not been having leakage    Baseline the urge drills have improved things by 60-75% better but it doesn't feel permanent    Status Partially Met      PT LONG TERM GOAL #3   Title Pt will report no pain in abdomen during or after doing exercise    Baseline feels irritated after being more active      PT LONG TERM GOAL #4   Title Pt will be able to engage her core correctly for doing housework without increased pain or prolapse symptoms    Baseline I haven't felt that this week, but that was more with exercise or running    Status Partially Met                   Plan - 06/23/21 0930     Clinical Impression Statement Pt did well with core and breathing today.  She was able to correctly coordinate and progress the exercises adding to HEP.  Pt will benefit from skilled PT to continue to address long term goals as she has made good progress thus far.    PT Treatment/Interventions ADLs/Self Care Home Management;Biofeedback;Cryotherapy;Electrical Stimulation;Moist Heat;Therapeutic exercise;Therapeutic activities;Neuromuscular re-education;Patient/family education;Manual techniques;Dry needling;Passive range of motion;Taping    PT Next Visit Plan f/u on abdominal fascial release and core strength; pelvic bulge and contract in supine and on ball    PT Home Exercise Plan urge techniquesAccess Code: [email or web address]  URL: [email or web address]  Date: 06/23/2021  Prepared by: Jari Favre    Exercises  Supine March - 1 x daily - 7 x weekly - 3 sets - 10 reps  Swiss Ball March Arms Out - 1 x daily - 7 x weekly - 3 sets - 10 reps  Bicep Curls on Swiss Ball - 1 x daily - 7 x weekly - 3 sets - 10 reps  Child's Pose with Sidebending - 1 x daily -  7 x weekly - 3 reps - 1 sets - 30 sec hold  Quadruped Pelvic Floor Contraction with Opposite Arm and Leg Lift - 1 x daily - 7 x weekly - 3 sets - 10 reps  Consulted and Agree with Plan of Care Patient             Patient will benefit from skilled therapeutic intervention in order to improve the following deficits and impairments:  Pain, Postural dysfunction, Decreased strength, Impaired tone, Increased fascial restricitons, Decreased range of motion  Visit Diagnosis: Muscle weakness (generalized)  Abnormal posture     Problem List Patient Active Problem List   Diagnosis Date Noted   S/P hernia surgery 04/21/2021   Sleep disturbance 05/06/2018   Ventral hernia with obstruction and without gangrene 02/22/2017   Rectocele 11/19/2016   Nodule of apex of right lung 11/19/2016   Genetic testing 09/28/2016   Ovarian cancer on right ([place]) 05/31/2016   Ovarian cancer ([place]) 05/31/2016   Pelvic mass 05/14/2016   Hyperlipidemia 11/07/2012   Fatigue 11/07/2012   Family history of early CAD 11/07/2012    Jule Ser, PT 06/23/2021, 9:33 AM  [facility] @ [place], [place], [address] Phone: [phone]   Fax:  [phone]  Name: DULCIE GAMMON MRN: [id number] Date of Birth: 10-18-1955

## 2021-06-23 NOTE — Patient Instructions (Signed)
Access Code: [email or web address] URL: [email or web address] Date: 06/23/2021 Prepared by: Jari Favre  Exercises Supine March - 1 x daily - 7 x weekly - 3 sets - 10 reps Swiss Ball March Arms Out - 1 x daily - 7 x weekly - 3 sets - 10 reps Bicep Curls on Swiss Ball - 1 x daily - 7 x weekly - 3 sets - 10 reps Child's Pose with Sidebending - 1 x daily - 7 x weekly - 3 reps - 1 sets - 30 sec hold Quadruped Pelvic Floor Contraction with Opposite Arm and Leg Lift - 1 x daily - 7 x weekly - 3 sets - 10 reps

## 2021-06-28 ENCOUNTER — Other Ambulatory Visit: Payer: Self-pay

## 2021-06-28 ENCOUNTER — Ambulatory Visit (INDEPENDENT_AMBULATORY_CARE_PROVIDER_SITE_OTHER)
Admission: RE | Admit: 2021-06-28 | Discharge: 2021-06-28 | Disposition: A | Discharge status: Home / Self Care | Payer: Self-pay | Source: Ambulatory Visit | Attending: Family Medicine | Admitting: Family Medicine

## 2021-06-28 DIAGNOSIS — Z8249 Family history of ischemic heart disease and other diseases of the circulatory system: Secondary | ICD-10-CM

## 2021-06-28 DIAGNOSIS — E782 Mixed hyperlipidemia: Secondary | ICD-10-CM

## 2021-06-30 ENCOUNTER — Other Ambulatory Visit: Payer: Self-pay

## 2021-06-30 ENCOUNTER — Encounter: Payer: Self-pay | Admitting: Physical Therapy

## 2021-06-30 ENCOUNTER — Ambulatory Visit: Payer: Medicare Other | Admitting: Physical Therapy

## 2021-06-30 DIAGNOSIS — M6281 Muscle weakness (generalized): Secondary | ICD-10-CM

## 2021-06-30 DIAGNOSIS — R293 Abnormal posture: Secondary | ICD-10-CM | POA: Diagnosis not present

## 2021-06-30 NOTE — Therapy (Signed)
[facility] @ [place], [place], [address] Phone: [phone]   Fax:  [phone]  Physical Therapy Treatment  Patient Details  Name: JAELI GRUBB MRN: [id number] Date of Birth: September 10, 1955 Referring Provider (PT): Caren Macadam, MD   Encounter Date: 06/30/2021   PT End of Session - 06/30/21 0934     Visit Number 4    Date for PT Re-Evaluation 08/22/21    Authorization Type BCBS    PT Start Time [phone]   late arrival   PT Stop Time 0847    PT Time Calculation (min) 36 min    Activity Tolerance Patient tolerated treatment well    Behavior During Therapy [facility] for tasks assessed/performed             Past Medical History:  Diagnosis Date   Cancer ([place])    Chest pain    atypical, myoview 05/31/05-EF 75%, low risk   Dyslipidemia    Family history of early CAD    Genetic testing 09/28/2016   Genetic testing revealed ONE pathogenic mutation in [place]. Though a pathogenic mutation was identified, increased risk for cancer is only associated with TWO (biallelic) pathogenic mutations. No additional mutations or variants of uncertain significance were identified. Testing was performed through [employer]'s 46-gene Common Hereditary Cancers Panel. [employer]'s Common Hereditary Cancers Panel includ   GERD (gastroesophageal reflux disease)    Hyperlipidemia    diet controlled   Inguinal hernia    RIGHT side   Osteopenia    Ovarian cancer ([place]) 2017    Past Surgical History:  Procedure Laterality Date   ABDOMINAL HYSTERECTOMY Left 05/31/2016   Procedure: HYSTERECTOMY ABDOMINAL;  Surgeon: Everitt Amber, MD;  [facility];  Service: Gynecology;  Laterality: Left;   BREAST SURGERY Right 1993   breast-calcification   COLONOSCOPY     INCISIONAL HERNIA REPAIR N/A 04/21/2021   Procedure: LAPAROSCOPIC INCISIONAL HERNIA WITH MESH;  Surgeon: Clovis Riley, MD;  [facility];  Service: General;  Laterality: N/A;    LAPAROTOMY N/A 05/31/2016   Procedure: EXPLORATORY LAPAROTOMY;  Surgeon: Everitt Amber, MD;  [facility];  Service: Gynecology;  Laterality: N/A;   OMENTECTOMY  05/31/2016   Procedure: OMENTECTOMY AND STAGING FOR OVARIAN CANCER;  Surgeon: Everitt Amber, MD;  [facility];  Service: Gynecology;;   SALPINGOOPHORECTOMY Right 05/31/2016   Procedure: BILATERAL SALPINGO OOPHORECTOMY;  Surgeon: Everitt Amber, MD;  [facility];  Service: Gynecology;  Laterality: Right;   TONSILLECTOMY     as a child   [place]    There were no vitals filed for this visit.   Subjective Assessment - 06/30/21 0816     Subjective I had one leak.  I felt some burning on the scar tissue    Patient Stated Goals I want to be able to do the inversion table, yoga and tai chi, spin class    Currently in Pain? No/denies                               [facility] Adult PT Treatment/Exercise - 06/30/21 0001       Lumbar Exercises: Standing   Other Standing Lumbar Exercises leaning modified kegel with LE staggered both ways 10x each    Other Standing Lumbar Exercises kegel with hold      Lumbar Exercises: Seated   Sit to Stand Limitations eccentric stand to sit  holding kegel    Other Seated Lumbar Exercises pelvic tilt on ball to find center - kegel with UE and LE reaches                     PT Education - 06/30/21 0932     Education Details Access Code: [email or web address]    Person(s) Educated Patient    Methods Explanation;Demonstration;Tactile cues;Verbal cues;Handout    Comprehension Verbalized understanding;Returned demonstration              PT Short Term Goals - 06/15/21 1159       PT SHORT TERM GOAL #1   Title ind with urge drills    Status Achieved               PT Long Term Goals - 06/30/21 0818       PT LONG TERM GOAL #1   Title Pt will be independent with advanced HEP    Status On-going      PT LONG TERM GOAL #2   Title Pt will  report 50% less urgency and has not been having leakage    Baseline the urge drills have improved things by 60-75% better      PT LONG TERM GOAL #3   Title Pt will report no pain in abdomen during or after doing exercise    Status On-going      PT LONG TERM GOAL #4   Title Pt will be able to engage her core correctly for doing housework without increased pain or prolapse symptoms    Baseline I was feeling the prolapse this morning walking when I hadn't gone to the bathroom    Status On-going                   Plan - 06/30/21 1004     Clinical Impression Statement Pt did well with review of exercises on the pball.  Pt was able to progress to eccentric exercises today and added to progress her HEP.  Pt is making good progress and having less leakage.  There is still some pain after doing exercises but does not seem to be worsening and feeling better overall.  Pt recommended to continue per POC    PT Treatment/Interventions ADLs/Self Care Home Management;Biofeedback;Cryotherapy;Electrical Stimulation;Moist Heat;Therapeutic exercise;Therapeutic activities;Neuromuscular re-education;Patient/family education;Manual techniques;Dry needling;Passive range of motion;Taping    PT Next Visit Plan f/u on sit to stand; add side lunge/fwd lunges, half kneeling with rotation, exhale with exertion with all strengthening    PT Home Exercise Plan Access Code: [email or web address]    Consulted and Agree with Plan of Care Patient             Patient will benefit from skilled therapeutic intervention in order to improve the following deficits and impairments:  Pain, Postural dysfunction, Decreased strength, Impaired tone, Increased fascial restricitons, Decreased range of motion  Visit Diagnosis: Muscle weakness (generalized)  Abnormal posture     Problem List Patient Active Problem List   Diagnosis Date Noted   S/P hernia surgery 04/21/2021   Sleep disturbance 05/06/2018   Ventral hernia with  obstruction and without gangrene 02/22/2017   Rectocele 11/19/2016   Nodule of apex of right lung 11/19/2016   Genetic testing 09/28/2016   Ovarian cancer on right ([place]) 05/31/2016   Ovarian cancer ([place]) 05/31/2016   Pelvic mass 05/14/2016   Hyperlipidemia 11/07/2012   Fatigue 11/07/2012   Family history of early CAD 11/07/2012    Camillo Flaming Sophiya Morello, PT 07/01/2021,  4:58 PM  [facility] @ [place], [place], [address] Phone: [phone]   Fax:  [phone]  Name: SHAMBHAVI SALLEY MRN: [id number] Date of Birth: 1955-06-21

## 2021-06-30 NOTE — Patient Instructions (Signed)
Access Code: [email or web address] URL: [email or web address] Date: 06/30/2021 Prepared by: Jari Favre  Exercises Supine March - 1 x daily - 7 x weekly - 3 sets - 10 reps Swiss Ball March Arms Out - 1 x daily - 7 x weekly - 3 sets - 10 reps Bicep Curls on Swiss Ball - 1 x daily - 7 x weekly - 3 sets - 10 reps Child's Pose with Sidebending - 1 x daily - 7 x weekly - 1 sets - 3 reps - 30 sec hold Quadruped Pelvic Floor Contraction with Opposite Arm and Leg Lift - 1 x daily - 7 x weekly - 3 sets - 10 reps Mini Squat with Pelvic Floor Contraction - 1 x daily - 7 x weekly - 2 sets - 10 reps Standing Low Back Flexion at Table - 3 x daily - 7 x weekly - 1 sets - 3 reps - 15-20 hold

## 2021-07-03 ENCOUNTER — Encounter: Payer: Self-pay | Admitting: Family Medicine

## 2021-07-07 ENCOUNTER — Other Ambulatory Visit: Payer: Self-pay

## 2021-07-07 ENCOUNTER — Ambulatory Visit: Payer: Medicare Other | Admitting: Physical Therapy

## 2021-07-07 DIAGNOSIS — H5203 Hypermetropia, bilateral: Secondary | ICD-10-CM | POA: Diagnosis not present

## 2021-07-14 ENCOUNTER — Encounter: Payer: Medicare Other | Admitting: Physical Therapy

## 2021-07-21 ENCOUNTER — Encounter: Payer: Medicare Other | Admitting: Physical Therapy

## 2021-07-28 ENCOUNTER — Other Ambulatory Visit: Payer: Self-pay

## 2021-07-28 ENCOUNTER — Ambulatory Visit: Payer: Medicare Other | Attending: Family Medicine | Admitting: Physical Therapy

## 2021-07-28 ENCOUNTER — Encounter: Payer: Self-pay | Admitting: Physical Therapy

## 2021-07-28 DIAGNOSIS — M6281 Muscle weakness (generalized): Secondary | ICD-10-CM | POA: Diagnosis not present

## 2021-07-28 DIAGNOSIS — R293 Abnormal posture: Secondary | ICD-10-CM | POA: Insufficient documentation

## 2021-07-28 NOTE — Therapy (Signed)
[facility] @ [place], [place], [address] Phone: [phone]   Fax:  [phone]  Physical Therapy Treatment  Patient Details  Name: JASHIRA COTUGNO MRN: [id number] Date of Birth: [birth year] Referring Provider (PT): Caren Macadam, MD   Encounter Date: 07/28/2021   PT End of Session - 07/28/21 0803     Visit Number 5    Date for PT Re-Evaluation 08/22/21    Authorization Type BCBS    PT Start Time 0801    PT Stop Time 0843    PT Time Calculation (min) 42 min    Activity Tolerance Patient tolerated treatment well    Behavior During Therapy [employer] for tasks assessed/performed             Past Medical History:  Diagnosis Date   Cancer ([place])    Chest pain    atypical, myoview 05/31/05-EF 75%, low risk   Dyslipidemia    Family history of early CAD    Genetic testing 09/28/2016   Genetic testing revealed ONE pathogenic mutation in [place]. Though a pathogenic mutation was identified, increased risk for cancer is only associated with TWO (biallelic) pathogenic mutations. No additional mutations or variants of uncertain significance were identified. Testing was performed through [employer]'s 46-gene Common Hereditary Cancers Panel. [employer]'s Common Hereditary Cancers Panel includ   GERD (gastroesophageal reflux disease)    Hyperlipidemia    diet controlled   Inguinal hernia    RIGHT side   Osteopenia    Ovarian cancer ([place]) 2017    Past Surgical History:  Procedure Laterality Date   ABDOMINAL HYSTERECTOMY Left 05/31/2016   Procedure: HYSTERECTOMY ABDOMINAL;  Surgeon: Everitt Amber, MD;  [facility];  Service: Gynecology;  Laterality: Left;   BREAST SURGERY Right 1993   breast-calcification   COLONOSCOPY     INCISIONAL HERNIA REPAIR N/A 04/21/2021   Procedure: LAPAROSCOPIC INCISIONAL HERNIA WITH MESH;  Surgeon: Clovis Riley, MD;  [facility];  Service: General;  Laterality: N/A;   LAPAROTOMY  N/A 05/31/2016   Procedure: EXPLORATORY LAPAROTOMY;  Surgeon: Everitt Amber, MD;  [facility];  Service: Gynecology;  Laterality: N/A;   OMENTECTOMY  05/31/2016   Procedure: OMENTECTOMY AND STAGING FOR OVARIAN CANCER;  Surgeon: Everitt Amber, MD;  [facility];  Service: Gynecology;;   SALPINGOOPHORECTOMY Right 05/31/2016   Procedure: BILATERAL SALPINGO OOPHORECTOMY;  Surgeon: Everitt Amber, MD;  [facility];  Service: Gynecology;  Laterality: Right;   TONSILLECTOMY     as a child   [place]    There were no vitals filed for this visit.   Subjective Assessment - 07/28/21 0853     Subjective The leak is only with strong urge and I haven't been focused on the exercises so I think that is why it was a little worse recently.    Currently in Pain? No/denies                               [facility] Adult PT Treatment/Exercise - 07/28/21 0001       Self-Care   Other Self-Care Comments  educated on scar tissue massage/fascial release      Manual Therapy   Manual Therapy Internal Pelvic Floor    Manual therapy comments pt identity confirmed and internal soft tissue assessed and treated with consent    Myofascial Release abdominal got release on Rt side of scar  and around bladder    Internal Pelvic Floor Rt levators and fascial release to urethra                       PT Short Term Goals - 06/15/21 1159       PT SHORT TERM GOAL #1   Title ind with urge drills    Status Achieved               PT Long Term Goals - 07/28/21 0804       PT LONG TERM GOAL #1   Title Pt will be independent with advanced HEP    Status Achieved      PT LONG TERM GOAL #2   Title Pt will report 50% less urgency and has not been having leakage    Baseline the urge drills have improved things by 60-75% better    Status Achieved      PT LONG TERM GOAL #3   Title Pt will report no pain in abdomen during or after doing exercise    Baseline was  a little sore being more active with new grandbaby, but better    Status Partially Met      PT LONG TERM GOAL #4   Title Pt will be able to engage her core correctly for doing housework without increased pain or prolapse symptoms    Baseline no report of feeling prolapse    Status Achieved                   Plan - 07/28/21 0847     Clinical Impression Statement Pt has been doing well with the exercises and feels like the main issue is the urgency.  Pt states she hasn't been working on things as much and that is why is is worse.  Pt is expected to do well with the exercises she has in her HEP.  Today's session focused on ensuring things are released around the bladder.  Pt had good release with STM techniques.  Pt has tight scar tissue and was educated on how to continue to release this on her own.  Pt will d/c today with HEP    PT Treatment/Interventions ADLs/Self Care Home Management;Biofeedback;Cryotherapy;Electrical Stimulation;Moist Heat;Therapeutic exercise;Therapeutic activities;Neuromuscular re-education;Patient/family education;Manual techniques;Dry needling;Passive range of motion;Taping    PT Next Visit Plan d/c    PT Home Exercise Plan Access Code: [email or web address]    Consulted and Agree with Plan of Care Patient             Patient will benefit from skilled therapeutic intervention in order to improve the following deficits and impairments:  Pain, Postural dysfunction, Decreased strength, Impaired tone, Increased fascial restricitons, Decreased range of motion  Visit Diagnosis: Muscle weakness (generalized)  Abnormal posture     Problem List Patient Active Problem List   Diagnosis Date Noted   S/P hernia surgery 04/21/2021   Sleep disturbance 05/06/2018   Ventral hernia with obstruction and without gangrene 02/22/2017   Rectocele 11/19/2016   Nodule of apex of right lung 11/19/2016   Genetic testing 09/28/2016   Ovarian cancer on right ([place]) 05/31/2016    Ovarian cancer ([place]) 05/31/2016   Pelvic mass 05/14/2016   Hyperlipidemia 11/07/2012   Fatigue 11/07/2012   Family history of early CAD 11/07/2012    Camillo Flaming Nur Rabold, PT 07/28/2021, 9:15 AM  [facility] @ [place], [place], [address] Phone: [phone]   Fax:  [phone]  Name: AMANTHA SKLAR MRN: [id number] Date of Birth: [birth year]   PHYSICAL THERAPY DISCHARGE SUMMARY  Visits from Start of Care: 5  Current functional level related to goals / functional outcomes:  See above goals   Remaining deficits: See above   Education / Equipment:  HEP  Patient agrees to discharge. Patient goals were partially met. Patient is being discharged due to being pleased with the current functional level.  Gustavus Bryant, PT 07/28/21 9:15 AM

## 2021-08-04 ENCOUNTER — Telehealth: Payer: Self-pay | Admitting: *Deleted

## 2021-08-04 ENCOUNTER — Encounter: Payer: Medicare Other | Admitting: Physical Therapy

## 2021-08-04 ENCOUNTER — Encounter: Payer: Self-pay | Admitting: Gynecologic Oncology

## 2021-08-04 NOTE — Telephone Encounter (Signed)
Patient called and stated "Dr Denman George and [facility]enna Sci[facility] APP helped me a while back find a new PCP.Well I got an e-mail today the Dr Micheline Rough is leaving [facility]. I was wondering since Dr Denman George left if [facility] APP could recommend a new female PCP to me in the area." Explained that the message would be given to [id number] [facility] APP and the office will call her back.  ?

## 2021-08-07 ENCOUNTER — Ambulatory Visit (INDEPENDENT_AMBULATORY_CARE_PROVIDER_SITE_OTHER): Payer: Medicare Other | Admitting: Family Medicine

## 2021-08-07 ENCOUNTER — Encounter: Payer: Self-pay | Admitting: Family Medicine

## 2021-08-07 VITALS — BP 144/69 | HR 71 | Temp 98.9°F | Wt 173.0 lb

## 2021-08-07 DIAGNOSIS — L0100 Impetigo, unspecified: Secondary | ICD-10-CM | POA: Diagnosis not present

## 2021-08-07 MED ORDER — MUPIROCIN 2 % EX OINT: TOPICAL_OINTMENT | Freq: Two times a day (BID) | CUTANEOUS | 0 refills | Status: AC

## 2021-08-07 NOTE — Progress Notes (Signed)
Subjective:  ? ? Patient ID: Theresa Frazier, female    DOB: 01-21-1956, 66 y.o.   MRN: [id number] ? ?Chief Complaint  ?Patient presents with  ? Wound Check  ?  On the back on neck, monitoring it for 4 days, is not getting better. Had not been putting anything on, thinks it as scabbed since. Soreness around it. Got a hair cut Friday before last. Noticed it Tuesday after the hair cut.  ? ? ?HPI ?Patient is followed by Dr. Ethlyn Gallery and seen today for acute concern.  Pt with a pruritic, erythematous, dry area with flaking on posterior left neck x4 days.  Pt endorses bumps that are sore in neck around the area.  Patient unsure of bites, cuts, wounds.  States had her hair cut /neck shaved about a wk ago.  Since then used clearasonic on neck and a cream of natural oils on it.  Denies fever, chills, neck pain. ?Pt has an appt with Dermatology tomorrow. ? ?Past Medical History:  ?Diagnosis Date  ? Cancer [facility])   ? Chest pain   ? atypical, myoview 05/31/05-EF 75%, low risk  ? Dyslipidemia   ? Family history of early CAD   ? Genetic testing 09/28/2016  ? Genetic testing revealed ONE pathogenic mutation in [place]. Though a pathogenic mutation was identified, increased risk for cancer is only associated with TWO (biallelic) pathogenic mutations. No additional mutations or variants of uncertain significance were identified. Testing was performed through [employer]'s 46-gene Common Hereditary Cancers Panel. [employer]'s Common Hereditary Cancers Panel includ  ? GERD (gastroesophageal reflux disease)   ? Hyperlipidemia   ? diet controlled  ? Inguinal hernia   ? RIGHT side  ? Osteopenia   ? Ovarian cancer ([place]) 2017  ? ? ?No Known Allergies ? ?ROS ?General: Denies fever, chills, night sweats, changes in weight, changes in appetite ?HEENT: Denies headaches, ear pain, changes in vision, rhinorrhea, sore throat ?CV: Denies CP, palpitations, SOB, orthopnea ?Pulm: Denies SOB, cough, wheezing ?GI: Denies abdominal pain, nausea, vomiting,  diarrhea, constipation ?GU: Denies dysuria, hematuria, frequency, vaginal discharge ?Msk: Denies muscle cramps, joint pains ?Neuro: Denies weakness, numbness, tingling ?Skin: Denies rashes, bruising + skin lesion on neck ?Psych: Denies depression, anxiety, hallucinations ? ?   ?Objective:  ?  ?Blood pressure (!) 144/69, pulse 71, temperature 98.9 ?F (37.2 ?C), temperature source Oral, weight 173 lb (78.5 kg), SpO2 97 %. ? ?Gen. Pleasant, well-nourished, in no distress, normal affect   ?HEENT: [place]/AT, face symmetric, conjunctiva clear, no scleral icterus, PERRLA, EOMI, nares patent without drainage ?Lungs: no accessory muscle use ?Cardiovascular: RRR, no peripheral edema ?Musculoskeletal: No deformities, no cyanosis or clubbing, normal tone ?Neuro:  A&Ox3, CN II-XII intact, normal gait ?Skin:  Warm, dry, intact.  Left lateral posterior neck with flat erythematous area of dried, flaky honey crusted skin lesion.  No drainage, tenderness, or induration noted.  Occipital lymphadenopathy present. ? ? ?Wt Readings from Last 3 Encounters:  ?08/07/21 173 lb (78.5 kg)  ?05/19/21 170 lb 12.8 oz (77.5 kg)  ?04/21/21 173 lb (78.5 kg)  ? ? ?Lab Results  ?Component Value Date  ? WBC 5.4 05/19/2021  ? HGB 13.4 05/19/2021  ? HCT 41.4 05/19/2021  ? PLT 230.0 05/19/2021  ? GLUCOSE 92 05/19/2021  ? CHOL 228 (H) 01/29/2020  ? TRIG 66 01/29/2020  ? HDL 59 01/29/2020  ? LDLCALC 153 (H) 01/29/2020  ? ALT 14 05/19/2021  ? AST 14 05/19/2021  ? NA 139 05/19/2021  ? K 4.0 05/19/2021  ?  CL 103 05/19/2021  ? CREATININE 0.80 05/19/2021  ? BUN 11 05/19/2021  ? CO2 28 05/19/2021  ? TSH 1.41 01/29/2020  ? HGBA1C 5.5 01/29/2020  ? ? ?Assessment/Plan: ? ?Impetigo ?-New problem ?-Honey crusted skin lesion on posterior left neck likely 2/2 staph infection possibly from having neck shaved during haircut with spread 2/2 use of Clariasonic device. ?-Advised to keep area clean and dry with gentle antibacterial soap.  Avoid scrubbing. ?-Given handout ?-Has  appointment with Dermatology tomorrow. ?- Plan: mupirocin ointment (BACTROBAN) 2 % ? ?F/u as needed ? ?Grier Mitts, MD ?

## 2021-08-09 DIAGNOSIS — L0100 Impetigo, unspecified: Secondary | ICD-10-CM | POA: Diagnosis not present

## 2021-08-09 DIAGNOSIS — B078 Other viral warts: Secondary | ICD-10-CM | POA: Diagnosis not present

## 2021-08-09 DIAGNOSIS — Z23 Encounter for immunization: Secondary | ICD-10-CM | POA: Diagnosis not present

## 2021-08-14 ENCOUNTER — Ambulatory Visit
Admission: RE | Admit: 2021-08-14 | Discharge: 2021-08-14 | Disposition: A | Discharge status: Home / Self Care | Payer: Medicare Other | Source: Ambulatory Visit | Attending: Family Medicine | Admitting: Family Medicine

## 2021-08-14 DIAGNOSIS — M8589 Other specified disorders of bone density and structure, multiple sites: Secondary | ICD-10-CM | POA: Diagnosis not present

## 2021-08-14 DIAGNOSIS — M8580 Other specified disorders of bone density and structure, unspecified site: Secondary | ICD-10-CM

## 2021-08-14 DIAGNOSIS — Z780 Asymptomatic menopausal state: Secondary | ICD-10-CM | POA: Diagnosis not present

## 2021-08-14 DIAGNOSIS — E2839 Other primary ovarian failure: Secondary | ICD-10-CM

## 2021-09-04 DIAGNOSIS — B078 Other viral warts: Secondary | ICD-10-CM | POA: Diagnosis not present

## 2021-09-04 DIAGNOSIS — L0100 Impetigo, unspecified: Secondary | ICD-10-CM | POA: Diagnosis not present

## 2021-09-04 DIAGNOSIS — L723 Sebaceous cyst: Secondary | ICD-10-CM | POA: Diagnosis not present

## 2021-09-27 DIAGNOSIS — H10413 Chronic giant papillary conjunctivitis, bilateral: Secondary | ICD-10-CM | POA: Diagnosis not present

## 2021-12-26 DIAGNOSIS — H0014 Chalazion left upper eyelid: Secondary | ICD-10-CM | POA: Diagnosis not present

## 2021-12-28 DIAGNOSIS — D225 Melanocytic nevi of trunk: Secondary | ICD-10-CM | POA: Diagnosis not present

## 2021-12-28 DIAGNOSIS — L814 Other melanin hyperpigmentation: Secondary | ICD-10-CM | POA: Diagnosis not present

## 2021-12-28 DIAGNOSIS — L578 Other skin changes due to chronic exposure to nonionizing radiation: Secondary | ICD-10-CM | POA: Diagnosis not present

## 2021-12-28 DIAGNOSIS — L920 Granuloma annulare: Secondary | ICD-10-CM | POA: Diagnosis not present

## 2022-02-12 ENCOUNTER — Ambulatory Visit (INDEPENDENT_AMBULATORY_CARE_PROVIDER_SITE_OTHER): Payer: Medicare Other | Admitting: Internal Medicine

## 2022-02-12 ENCOUNTER — Encounter: Payer: Self-pay | Admitting: Internal Medicine

## 2022-02-12 VITALS — BP 124/76 | HR 67 | Ht 62.0 in | Wt 174.0 lb

## 2022-02-12 DIAGNOSIS — Z23 Encounter for immunization: Secondary | ICD-10-CM | POA: Diagnosis not present

## 2022-02-12 DIAGNOSIS — R202 Paresthesia of skin: Secondary | ICD-10-CM | POA: Diagnosis not present

## 2022-02-12 DIAGNOSIS — Z8543 Personal history of malignant neoplasm of ovary: Secondary | ICD-10-CM | POA: Diagnosis not present

## 2022-02-12 LAB — CBC
HCT: 41 % (ref 36.0–46.0)
Hemoglobin: 13.6 g/dL (ref 12.0–15.0)
MCHC: 33.3 g/dL (ref 30.0–36.0)
MCV: 92.6 fl (ref 78.0–100.0)
Platelets: 228 10*3/uL (ref 150.0–400.0)
RBC: 4.42 Mil/uL (ref 3.87–5.11)
RDW: 13.2 % (ref 11.5–15.5)
WBC: 5.3 10*3/uL (ref 4.0–10.5)

## 2022-02-12 LAB — VITAMIN B12: Vitamin B-12: 399 pg/mL (ref 211–911)

## 2022-02-12 LAB — COMPREHENSIVE METABOLIC PANEL
ALT: 18 U/L (ref 0–35)
AST: 16 U/L (ref 0–37)
Albumin: 4 g/dL (ref 3.5–5.2)
Alkaline Phosphatase: 84 U/L (ref 39–117)
BUN: 11 mg/dL (ref 6–23)
CO2: 28 mEq/L (ref 19–32)
Calcium: 9.4 mg/dL (ref 8.4–10.5)
Chloride: 104 mEq/L (ref 96–112)
Creatinine, Ser: 0.83 mg/dL (ref 0.40–1.20)
GFR: 73.25 mL/min (ref >60.00)
Glucose, Bld: 90 mg/dL (ref 70–99)
Potassium: 4 mEq/L (ref 3.5–5.1)
Sodium: 139 mEq/L (ref 135–145)
Total Bilirubin: 0.5 mg/dL (ref 0.2–1.2)
Total Protein: 6.5 g/dL (ref 6.0–8.3)

## 2022-02-12 LAB — TSH: TSH: 2.06 u[IU]/mL (ref 0.35–5.50)

## 2022-02-12 LAB — HEMOGLOBIN A1C: Hgb A1c MFr Bld: 5.9 % (ref 4.6–6.5)

## 2022-02-12 LAB — VITAMIN D 25 HYDROXY (VIT D DEFICIENCY, FRACTURES): VITD: 97.78 ng/mL (ref 30.00–100.00)

## 2022-02-12 NOTE — Progress Notes (Signed)
   Subjective:   Patient ID: Theresa Frazier, female    DOB: 1955/07/20, 66 y.o.   MRN: [id number]  HPI The patient is a 66 YO female coming in for [facility] that is having some new tingling feet in the last 3 months.   PMH, [facility], social history reviewed and updated  Review of Systems  Constitutional: Negative.   HENT: Negative.    Eyes: Negative.   Respiratory:  Negative for cough, chest tightness and shortness of breath.   Cardiovascular:  Negative for chest pain, palpitations and leg swelling.  Gastrointestinal:  Negative for abdominal distention, abdominal pain, constipation, diarrhea, nausea and vomiting.  Musculoskeletal: Negative.   Skin: Negative.   Neurological: Negative.        Tingling feet  Psychiatric/Behavioral: Negative.      Objective:  Physical Exam Constitutional:      Appearance: She is well-developed.  HENT:     Head: Normocephalic and atraumatic.  Cardiovascular:     Rate and Rhythm: Normal rate and regular rhythm.  Pulmonary:     Effort: Pulmonary effort is normal. No respiratory distress.     Breath sounds: Normal breath sounds. No wheezing or rales.  Abdominal:     General: Bowel sounds are normal. There is no distension.     Palpations: Abdomen is soft.     Tenderness: There is no abdominal tenderness. There is no rebound.  Musculoskeletal:     Cervical back: Normal range of motion.  Skin:    General: Skin is warm and dry.  Neurological:     Mental Status: She is alert and oriented to person, place, and time.     Coordination: Coordination normal.     Vitals:   02/12/22 0849  BP: 124/76  Pulse: 67  SpO2: 97%  Weight: 174 lb (78.9 kg)  Height: '5\' 2"'$  (1.575 m)  Prevnar 20 given at visit  Assessment & Plan:  Visit time 20 minutes in face to face communication with patient and coordination of care, additional 15 minutes spent in record review, coordination or care, ordering tests, communicating/referring to other healthcare professionals,  documenting in medical records all on the same day of the visit for total time 35 minutes spent on the visit.

## 2022-02-12 NOTE — Patient Instructions (Addendum)
We will check the labs today.  We have given you the pneumonia vaccine.

## 2022-02-12 NOTE — Assessment & Plan Note (Signed)
No clinical signs of recurrence. Can continue yearly Ca-125.

## 2022-02-12 NOTE — Assessment & Plan Note (Signed)
Checking vitamin D, TSH, HgA1c, vitamin B12, CBC, CMP. Treat as appropriate.

## 2022-04-16 ENCOUNTER — Telehealth: Payer: Self-pay | Admitting: Internal Medicine

## 2022-04-16 NOTE — Telephone Encounter (Signed)
Pt also asking if she should get RSV vaccine.  Pt phone [phone]

## 2022-04-16 NOTE — Telephone Encounter (Signed)
Pt called to ask for a order for CA-125 lab test.  Pt says every December she has the lab done. Pt says she had December 2017 surgery with oncologist Dr.Rossi

## 2022-04-17 NOTE — Telephone Encounter (Signed)
She is due for physical in Dec and was advised at last visit to return then. Is she asking for labs prior to physical? We can usually do these day of visit.

## 2022-04-19 ENCOUNTER — Encounter: Payer: Self-pay | Admitting: Internal Medicine

## 2022-04-19 NOTE — Telephone Encounter (Signed)
Spoke with patient and was able to gt her scheduled for her physical

## 2022-05-01 ENCOUNTER — Other Ambulatory Visit: Payer: Self-pay | Admitting: Internal Medicine

## 2022-05-01 DIAGNOSIS — Z1231 Encounter for screening mammogram for malignant neoplasm of breast: Secondary | ICD-10-CM

## 2022-05-21 ENCOUNTER — Encounter: Payer: Self-pay | Admitting: Internal Medicine

## 2022-05-21 ENCOUNTER — Ambulatory Visit (INDEPENDENT_AMBULATORY_CARE_PROVIDER_SITE_OTHER): Payer: Medicare Other | Admitting: Internal Medicine

## 2022-05-21 VITALS — BP 120/80 | HR 74 | Temp 98.0°F | Ht 62.0 in | Wt 178.0 lb

## 2022-05-21 DIAGNOSIS — Z0000 Encounter for general adult medical examination without abnormal findings: Secondary | ICD-10-CM

## 2022-05-21 DIAGNOSIS — Z8543 Personal history of malignant neoplasm of ovary: Secondary | ICD-10-CM | POA: Diagnosis not present

## 2022-05-21 DIAGNOSIS — Z8249 Family history of ischemic heart disease and other diseases of the circulatory system: Secondary | ICD-10-CM

## 2022-05-21 DIAGNOSIS — E782 Mixed hyperlipidemia: Secondary | ICD-10-CM

## 2022-05-21 LAB — LIPID PANEL
Cholesterol: 237 mg/dL — ABNORMAL HIGH (ref 0–200)
HDL: 73.8 mg/dL (ref >39.00)
LDL Cholesterol: 151 mg/dL — ABNORMAL HIGH (ref 0–99)
NonHDL: 162.91
Total CHOL/HDL Ratio: 3
Triglycerides: 62 mg/dL (ref 0.0–149.0)
VLDL: 12.4 mg/dL (ref 0.0–40.0)

## 2022-05-21 LAB — VITAMIN D 25 HYDROXY (VIT D DEFICIENCY, FRACTURES): VITD: 61.81 ng/mL (ref 30.00–100.00)

## 2022-05-21 NOTE — Assessment & Plan Note (Signed)
Flu shot up to date. Covid-19 up to date. RSV done. Pneumonia complete. Shingrix complete. Tetanus due 2028. Colonoscopy due 2029. Mammogram due 2024, pap smear aged out and dexa due 2025. Counseled about sun safety and mole surveillance. Counseled about the dangers of distracted driving. Given 10 year screening recommendations.

## 2022-05-21 NOTE — Progress Notes (Signed)
Subjective:   Patient ID: Theresa Frazier, female    DOB: 11-08-1955, 66 y.o.   MRN: [id number]  HPI Here for medicare wellness and physical, no new complaints. Please see A/P for status and treatment of chronic medical problems.   Diet: heart healthy Physical activity: sedentary Depression/mood screen: negative Hearing: intact to whispered voice Visual acuity: grossly normal with lens, performs annual eye exam  ADLs: capable Fall risk: none Home safety: good Cognitive evaluation: intact to orientation, naming, recall and repetition EOL planning: adv directives discussed, not in place  [employer] Visit from 05/21/2022 in [place] at [place] Visit from 05/21/2022 in [place] at [facility]  PHQ-9 Total Score 0         02/18/2021    4:36 PM 04/21/2021    5:39 AM 05/19/2021    1:02 PM 02/12/2022    8:59 AM 05/21/2022    9:27 AM  [place] in the past year?   0 0 0  Was there an injury with Fall?     0  Fall Risk Category Calculator     0  Fall Risk Category     Low  Patient Fall Risk Level Low fall risk Low fall risk       I have personally reviewed and have noted 1. The patient's medical and social history - reviewed today no changes 2. Their use of alcohol, tobacco or illicit drugs 3. Their current medications and supplements 4. The patient's functional ability including ADL's, fall risks, home safety risks and hearing or visual impairment. 5. Diet and physical activities 6. Evidence for depression or mood disorders 7. Care team reviewed and updated 8.  The patient is not on an opioid pain medication.  Patient Care Team: Hoyt Koch, MD as PCP - General (Internal Medicine) Georganna Skeans, MD as Consulting Physician (General Surgery) Everitt Amber, MD as Consulting Physician (Gynecologic Oncology) Renda Rolls, Jennefer Bravo, MD as Referring Physician  (Dermatology) Past Medical History:  Diagnosis Date   Cancer [facility])    Chest pain    atypical, myoview 05/31/05-EF 75%, low risk   Dyslipidemia    Family history of early CAD    Genetic testing 09/28/2016   Genetic testing revealed ONE pathogenic mutation in [place]. Though a pathogenic mutation was identified, increased risk for cancer is only associated with TWO (biallelic) pathogenic mutations. No additional mutations or variants of uncertain significance were identified. Testing was performed through [employer]'s 46-gene Common Hereditary Cancers Panel. [facility]'s Common Hereditary Cancers Panel includ   GERD (gastroesophageal reflux disease)    Hyperlipidemia    diet controlled   Inguinal hernia    RIGHT side   Osteopenia    Ovarian cancer ([place]) 2017   Past Surgical History:  Procedure Laterality Date   ABDOMINAL HYSTERECTOMY Left 05/31/2016   Procedure: HYSTERECTOMY ABDOMINAL;  Surgeon: Everitt Amber, MD;  Location: [facility];  Service: Gynecology;  Laterality: Left;   BREAST SURGERY Right 1993   breast-calcification   COLONOSCOPY     INCISIONAL HERNIA REPAIR N/A 04/21/2021   Procedure: LAPAROSCOPIC INCISIONAL HERNIA WITH MESH;  Surgeon: Clovis Riley, MD;  Location: [facility];  Service: General;  Laterality: N/A;   LAPAROTOMY N/A 05/31/2016   Procedure: EXPLORATORY LAPAROTOMY;  Surgeon: Everitt Amber, MD;  Location: [facility];  Service: Gynecology;  Laterality: N/A;   OMENTECTOMY  05/31/2016   Procedure: OMENTECTOMY AND STAGING  FOR OVARIAN CANCER;  Surgeon: Everitt Amber, MD;  Location: [facility];  Service: Gynecology;;   SALPINGOOPHORECTOMY Right 05/31/2016   Procedure: BILATERAL SALPINGO OOPHORECTOMY;  Surgeon: Everitt Amber, MD;  Location: [facility];  Service: Gynecology;  Laterality: Right;   TONSILLECTOMY     as a child   WISDOM TOOTH EXTRACTION  1975   Family History  Problem Relation Age of Onset   Pancreatic cancer Mother 69       d.73 - two months after diagnosis   Depression Mother     Diabetes Mother        at end of life   Hearing loss Father    Heart attack Father 33   Heart disease Father    Hypertension Sister    Arthritis Sister    Mental illness Sister        closed head injury as teen   Alcohol abuse Sister    Colon polyps Brother 56   Alcohol abuse Brother    Diabetes Brother    Arthritis Brother    Heart failure Brother 9   Atrial fibrillation Brother    Liver disease Brother    Alcohol abuse Maternal Grandmother    Drug abuse Maternal Grandfather    Heart attack Paternal Grandfather    Melanoma Daughter 62   Addison's disease Maternal Aunt 76   Leukemia Maternal Uncle 62       d.59 - shortly after diagnosis   Esophageal cancer Neg Hx    Rectal cancer Neg Hx    Stomach cancer Neg Hx    Colon cancer Neg Hx    Review of Systems  Constitutional: Negative.   HENT: Negative.    Eyes: Negative.   Respiratory:  Negative for cough, chest tightness and shortness of breath.   Cardiovascular:  Negative for chest pain, palpitations and leg swelling.  Gastrointestinal:  Negative for abdominal distention, abdominal pain, constipation, diarrhea, nausea and vomiting.  Musculoskeletal: Negative.   Skin: Negative.   Neurological: Negative.   Psychiatric/Behavioral: Negative.      Objective:  Physical Exam Constitutional:      Appearance: She is well-developed.  HENT:     Head: Normocephalic and atraumatic.  Cardiovascular:     Rate and Rhythm: Normal rate and regular rhythm.  Pulmonary:     Effort: Pulmonary effort is normal. No respiratory distress.     Breath sounds: Normal breath sounds. No wheezing or rales.  Abdominal:     General: Bowel sounds are normal. There is no distension.     Palpations: Abdomen is soft.     Tenderness: There is no abdominal tenderness. There is no rebound.  Musculoskeletal:     Cervical back: Normal range of motion.  Skin:    General: Skin is warm and dry.  Neurological:     Mental Status: She is alert and  oriented to person, place, and time.     Coordination: Coordination normal.     Vitals:   05/21/22 0923  BP: 120/80  Pulse: 74  Temp: 98 F (36.7 C)  TempSrc: Oral  SpO2: 97%  Weight: 178 lb (80.7 kg)  Height: '5\' 2"'$  (1.575 m)    Assessment & Plan:

## 2022-05-21 NOTE — Assessment & Plan Note (Signed)
Checking lipid panel. 

## 2022-05-21 NOTE — Assessment & Plan Note (Signed)
Checking lipid panel and adjust as needed.  

## 2022-05-21 NOTE — Assessment & Plan Note (Signed)
Checking CEA and CA125

## 2022-05-22 LAB — CA 125: CA 125: 9 U/mL (ref <35)

## 2022-05-22 LAB — CEA: CEA: <2 ng/mL

## 2022-06-11 IMAGING — CT CT CARDIAC CORONARY ARTERY CALCIUM SCORE
3 series · 14 of 20 positions shown, 16 images · non-contrast
Comparison: CT of the chest and abdomen on 08/16/2017

Addendum:
:
Cardiovascular Disease Risk stratification

EXAM:
Coronary Calcium Score
TECHNIQUE: A gated, non-contrast computed tomography scan of the heart was
performed using 3mm slice thickness. Axial images were analyzed on a
dedicated workstation. Calcium scoring of the coronary arteries was
performed using the Agatston method.

[Series 2: cascseq 2.0 sa36 (id) (id) · axial · 0.39mm/px · z∈[-237,-157]mm · 4 of 68 slices shown]
[im 14/68  vessel]
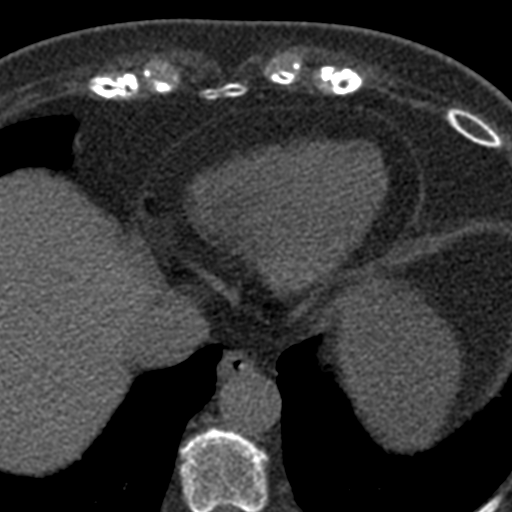
[im 27/68  vessel]
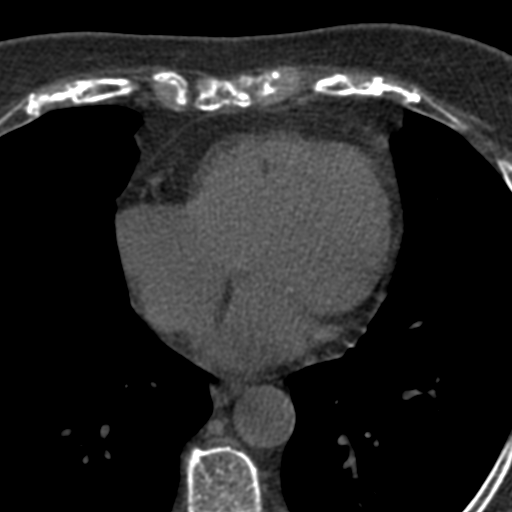
[im 41/68  vessel]
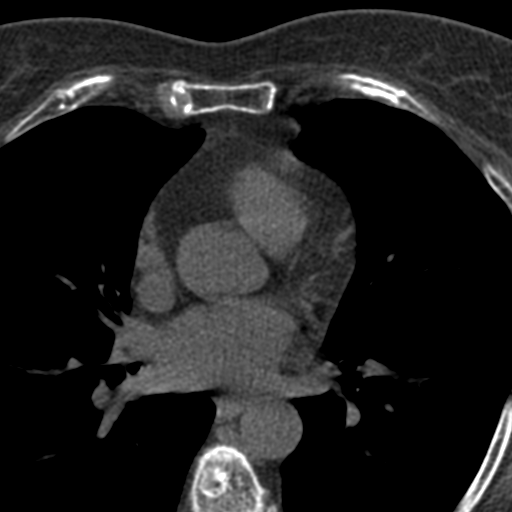
[im 54/68  vessel]
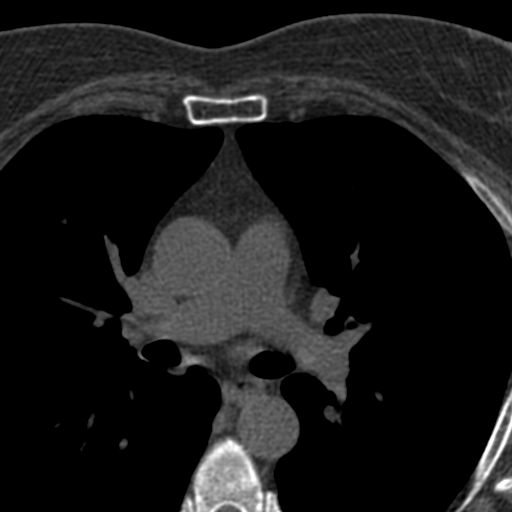

[Series 3: cascseq 2.0 bf37 st · axial · 0.65mm/px · z∈[-241,-153]mm · 5 of 68 slices shown, 7 images]
[im 12/68  vessel]
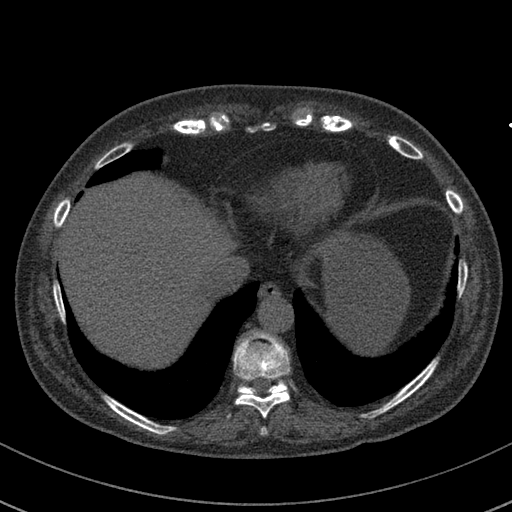
[im 12/68  lung]
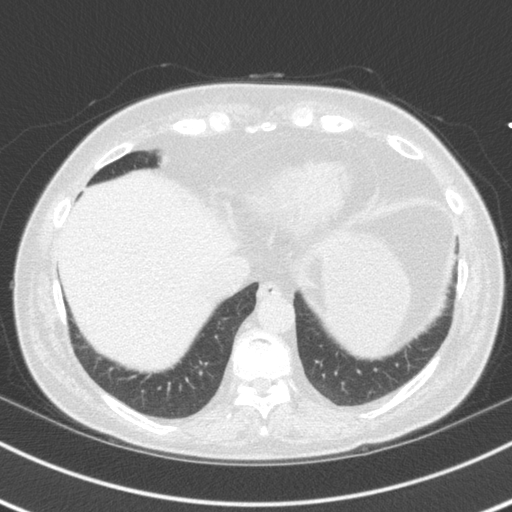
[im 23/68  vessel]
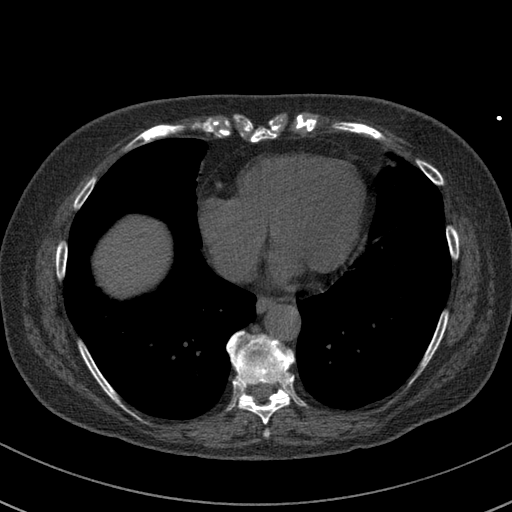
[im 34/68  vessel]
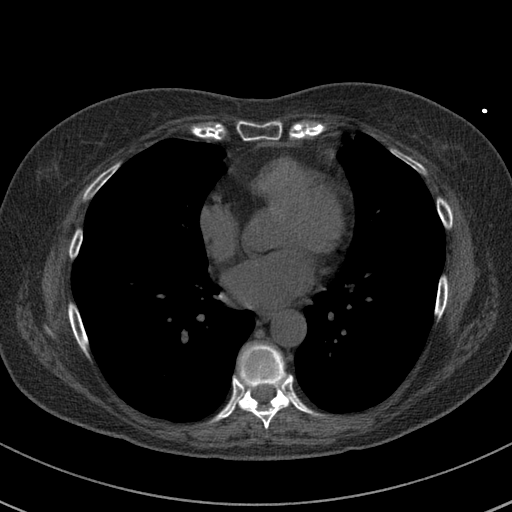
[im 45/68  vessel]
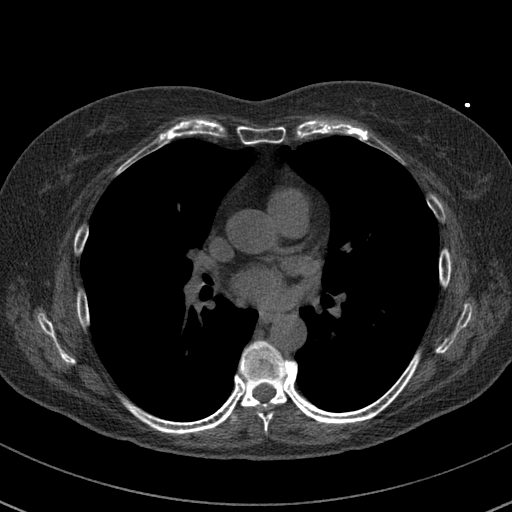
[im 56/68  vessel]
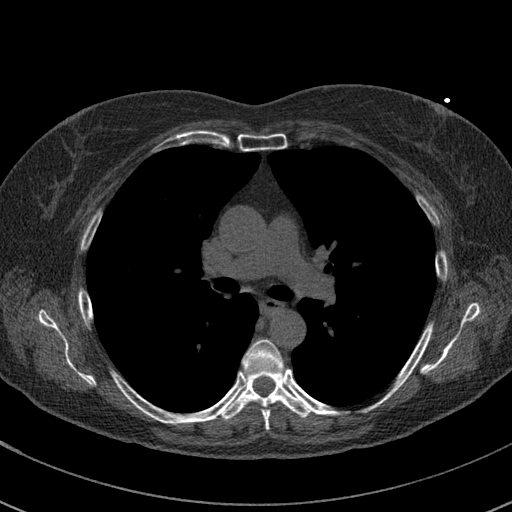
[im 56/68  lung]
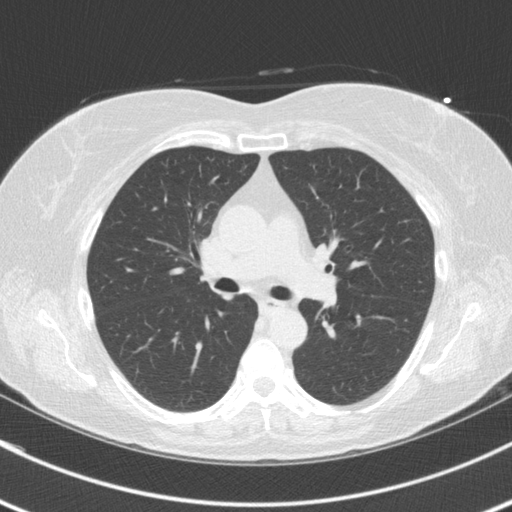

[Series 4: cascseq 2.0 br59 lung · axial · 0.65mm/px · z∈[-241,-153]mm · 5 of 68 slices shown]
[im 12/68  lung]
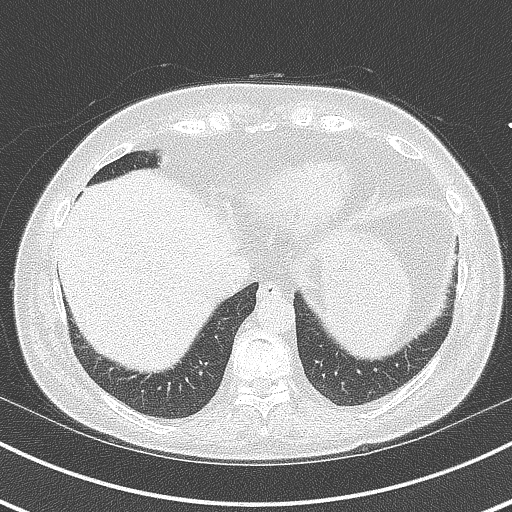
[im 23/68  lung]
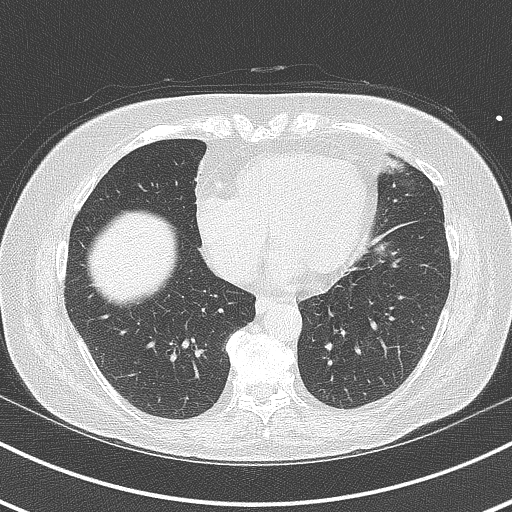
[im 34/68  lung]
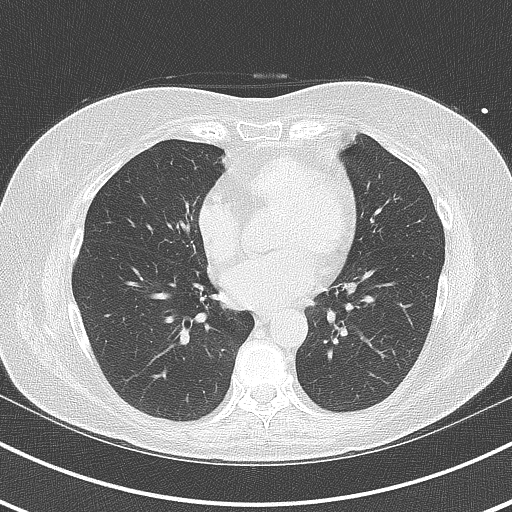
[im 45/68  lung]
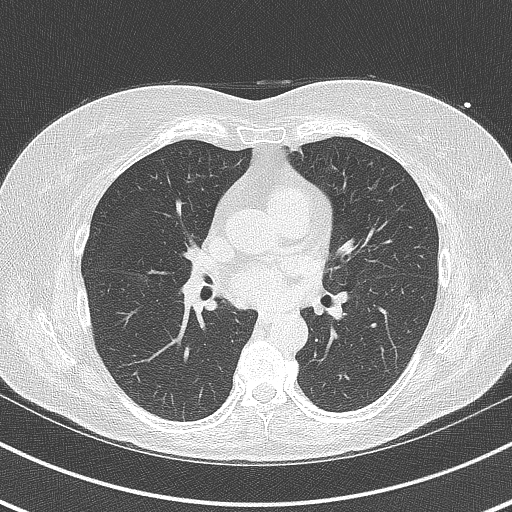
[im 56/68  lung]
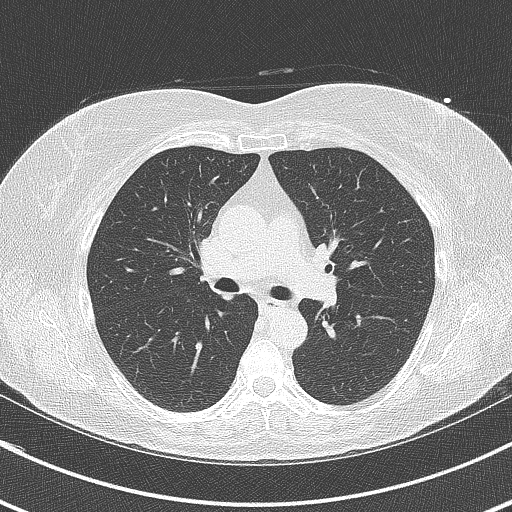

[14 of 20 positions shown; findings below may reference images not displayed]

FINDINGS: Coronary arteries: Normal origins.

Coronary Calcium Score:

Left main: 0

Left anterior descending artery:

Left circumflex artery: 0

Right coronary artery:

Total:

Percentile: 64

Pericardium: Normal.

Ascending Aorta: Normal caliber.

Non-cardiac: See separate report from [REDACTED].
IMPRESSION: Coronary calcium score of 22.9. This was 64 percentile for age-,
race-, and sex-matched controls.



If CAC=0, it is reasonable to withhold statin therapy and reassess
in 5 to 10 years, as long as higher risk conditions are absent
(diabetes mellitus, family history of premature CHD in first degree
relatives (males <55 years; females <65 years), cigarette smoking,
or LDL >=190 mg/dL).

If CAC is 1 to 99, it is reasonable to initiate statin therapy for
patients >=55 years of age.

If CAC is >=100 or >=75th percentile, it is reasonable to initiate
statin therapy at any age.

Cardiology referral should be considered for patients with CAC
scores >=400 or >=75th percentile.

*6325 AHA/ACC/AACVPR/AAPA/ABC/JIM/[place]/GANDARA/[place]vich/NAZARETH/FRAGA/ANNA-REETA
Guideline on the Management of Blood Cholesterol: A Report of the
American College of Cardiology/American Heart Association Task Force
on Clinical Practice Guidelines. J Am Coll Cardiol.
8076;73(24):4134-4670.

Zeinab Tiger, DO [REDACTED]

EXAM:
OVER-READ INTERPRETATION  CT CHEST

The following report is an over-read performed by radiologist Dr.
over-read does not include interpretation of cardiac or coronary
anatomy or pathology. The coronary calcium score interpretation by
the cardiologist is attached.
FINDINGS: No significant noncardiac vascular findings. Visualized mediastinum
and hilar regions demonstrate no lymphadenopathy or masses.
Visualized lungs show no evidence of pulmonary edema, consolidation,
pneumothorax, nodule or pleural fluid. Stable small cysts in the
visualized upper liver. Visualized bony structures are unremarkable.
IMPRESSION: No significant incidental findings.  Stable small hepatic cysts.

*** End of Addendum ***
:
Cardiovascular Disease Risk stratification

EXAM:
Coronary Calcium Score
FINDINGS: Coronary arteries: Normal origins.

Coronary Calcium Score:

Left main: 0

Left anterior descending artery:

Left circumflex artery: 0

Right coronary artery:

Total:

Percentile: 64

Pericardium: Normal.

Ascending Aorta: Normal caliber.

Non-cardiac: See separate report from [REDACTED].
IMPRESSION: Coronary calcium score of 22.9. This was 64 percentile for age-,
race-, and sex-matched controls.



If CAC=0, it is reasonable to withhold statin therapy and reassess
in 5 to 10 years, as long as higher risk conditions are absent
(diabetes mellitus, family history of premature CHD in first degree
relatives (males <55 years; females <65 years), cigarette smoking,
or LDL >=190 mg/dL).

If CAC is 1 to 99, it is reasonable to initiate statin therapy for
patients >=55 years of age.

If CAC is >=100 or >=75th percentile, it is reasonable to initiate
statin therapy at any age.

Cardiology referral should be considered for patients with CAC
scores >=400 or >=75th percentile.

*6325 AHA/ACC/AACVPR/AAPA/ABC/JIM/WELERSON/GANDARA/Gurevich/NAZARETH/FRAGA/ANNA-REETA
Guideline on the Management of Blood Cholesterol: A Report of the
American College of Cardiology/American Heart Association Task Force
on Clinical Practice Guidelines. J Am Coll Cardiol.
8076;73(24):4134-4670.

Zeinab Tiger, DO [REDACTED]

## 2022-06-26 ENCOUNTER — Ambulatory Visit
Admission: RE | Admit: 2022-06-26 | Discharge: 2022-06-26 | Disposition: A | Discharge status: Home / Self Care | Payer: Medicare Other | Source: Ambulatory Visit | Attending: Internal Medicine | Admitting: Internal Medicine

## 2022-06-26 DIAGNOSIS — Z1231 Encounter for screening mammogram for malignant neoplasm of breast: Secondary | ICD-10-CM

## 2022-06-27 ENCOUNTER — Encounter: Payer: Self-pay | Admitting: Internal Medicine

## 2022-06-27 LAB — HM MAMMOGRAPHY

## 2022-07-11 DIAGNOSIS — H5203 Hypermetropia, bilateral: Secondary | ICD-10-CM | POA: Diagnosis not present

## 2022-08-21 DIAGNOSIS — K080 Exfoliation of teeth due to systemic causes: Secondary | ICD-10-CM | POA: Diagnosis not present

## 2022-08-27 DIAGNOSIS — K080 Exfoliation of teeth due to systemic causes: Secondary | ICD-10-CM | POA: Diagnosis not present

## 2022-10-29 ENCOUNTER — Ambulatory Visit (HOSPITAL_COMMUNITY)
Admission: EM | Admit: 2022-10-29 | Discharge: 2022-10-29 | Disposition: A | Discharge status: Home / Self Care | Payer: Medicare Other | Attending: Family Medicine | Admitting: Family Medicine

## 2022-10-29 ENCOUNTER — Other Ambulatory Visit: Payer: Self-pay

## 2022-10-29 ENCOUNTER — Encounter (HOSPITAL_COMMUNITY): Payer: Self-pay | Admitting: *Deleted

## 2022-10-29 ENCOUNTER — Ambulatory Visit (INDEPENDENT_AMBULATORY_CARE_PROVIDER_SITE_OTHER): Payer: Medicare Other

## 2022-10-29 DIAGNOSIS — M25512 Pain in left shoulder: Secondary | ICD-10-CM

## 2022-10-29 DIAGNOSIS — L309 Dermatitis, unspecified: Secondary | ICD-10-CM | POA: Diagnosis not present

## 2022-10-29 DIAGNOSIS — R21 Rash and other nonspecific skin eruption: Secondary | ICD-10-CM | POA: Diagnosis not present

## 2022-10-29 DIAGNOSIS — M19012 Primary osteoarthritis, left shoulder: Secondary | ICD-10-CM | POA: Diagnosis not present

## 2022-10-29 MED ORDER — PREDNISONE 20 MG PO TABS: 40.0000 mg | ORAL_TABLET | Freq: Every day | ORAL | 0 refills | Status: DC

## 2022-10-29 MED ORDER — KETOROLAC TROMETHAMINE 30 MG/ML IJ SOLN
30.0000 mg | Freq: Once | INTRAMUSCULAR | Status: AC
  Administered 2022-10-29: 30 mg via INTRAMUSCULAR

## 2022-10-29 MED ORDER — KETOROLAC TROMETHAMINE 30 MG/ML IJ SOLN
INTRAMUSCULAR | Status: AC
  Filled 2022-10-29: qty 1

## 2022-10-29 MED ORDER — TRAMADOL HCL 50 MG PO TABS: 50.0000 mg | ORAL_TABLET | Freq: Four times a day (QID) | ORAL | 0 refills | Status: DC | PRN

## 2022-10-29 MED ORDER — DOXYCYCLINE HYCLATE 100 MG PO CAPS: 100.0000 mg | ORAL_CAPSULE | Freq: Two times a day (BID) | ORAL | 0 refills | Status: AC

## 2022-10-29 NOTE — Discharge Instructions (Addendum)
Your x-ray showed some arthritis but no sign of any metastatic cancer.  You have been given a shot of Toradol 30 mg today.  Take tramadol 50 mg-- 1 tablet every 6 hours as needed for pain.  This medication can make you sleepy or dizzy  Take prednisone 20 mg--2 daily for 5 days  Take doxycycline 100 mg --1 capsule 2 times daily for 7 days

## 2022-10-29 NOTE — ED Provider Notes (Signed)
[facility]    CSN: [id number] Arrival date & time: 10/29/22  1210      History   Chief Complaint Chief Complaint  Patient presents with   Torticollis   Shoulder Pain    HPI Theresa Frazier is a 67 y.o. female.    Shoulder Pain  Here for pain in her left shoulder.  She also has itching and rash overlying where she is hurting.  On May 25 she had done some work in the yard and moves a wood pile.  After that she began itching over her left anterior shoulder and notes a rash.  There was a erythematous bump in the middle of it that has improved.  She never saw an insect attached.  No fever or chills  Aleve did help maybe some yesterday, but she states she is also distracted by the factor that it had a stroke yesterday.  She has no allergy medication  Past medical history significant for ovarian cancer in 2017  Past Medical History:  Diagnosis Date   Cancer [facility])    Chest pain    atypical, myoview 05/31/05-EF 75%, low risk   Dyslipidemia    Family history of early CAD    Genetic testing 09/28/2016   Genetic testing revealed ONE pathogenic mutation in [place]. Though a pathogenic mutation was identified, increased risk for cancer is only associated with TWO (biallelic) pathogenic mutations. No additional mutations or variants of uncertain significance were identified. Testing was performed through [facility]'s 46-gene Common Hereditary Cancers Panel. [employer]'s Common Hereditary Cancers Panel includ   GERD (gastroesophageal reflux disease)    Hyperlipidemia    diet controlled   Inguinal hernia    RIGHT side   Osteopenia    Ovarian cancer ([facility]) 2017    Patient Active Problem List   Diagnosis Date Noted   Routine general medical examination at a health care facility 05/21/2022   Tingling of both feet 02/12/2022   Sleep disturbance 05/06/2018   Rectocele 11/19/2016   Nodule of apex of right lung 11/19/2016   Genetic testing 09/28/2016   Hx of ovarian cancer  05/31/2016   Hyperlipidemia 11/07/2012   Family history of early CAD 11/07/2012    Past Surgical History:  Procedure Laterality Date   ABDOMINAL HYSTERECTOMY Left 05/31/2016   Procedure: HYSTERECTOMY ABDOMINAL;  Surgeon: Adolphus Birchwood, MD;  [facility];  Service: Gynecology;  Laterality: Left;   BREAST EXCISIONAL BIOPSY Right    BREAST SURGERY Right 1993   breast-calcification   COLONOSCOPY     INCISIONAL HERNIA REPAIR N/A 04/21/2021   Procedure: LAPAROSCOPIC INCISIONAL HERNIA WITH MESH;  Surgeon: Berna Bue, MD;  Location: [facility];  Service: General;  Laterality: N/A;   LAPAROTOMY N/A 05/31/2016   Procedure: EXPLORATORY LAPAROTOMY;  Surgeon: Adolphus Birchwood, MD;  Location: [facility];  Service: Gynecology;  Laterality: N/A;   OMENTECTOMY  05/31/2016   Procedure: OMENTECTOMY AND STAGING FOR OVARIAN CANCER;  Surgeon: Adolphus Birchwood, MD;  [facility];  Service: Gynecology;;   SALPINGOOPHORECTOMY Right 05/31/2016   Procedure: BILATERAL SALPINGO OOPHORECTOMY;  Surgeon: Adolphus Birchwood, MD;  Location[facility];  Service: Gynecology;  Laterality: Right;   TONSILLECTOMY     as a child   WISDOM TOOTH EXTRACTION  1975    OB History   No obstetric history on file.      Home Medications    Prior to Admission medications   Medication Sig Start Date End Date Taking? Authorizing Provider  doxycycline (VIBRAMYCIN) 100 MG  capsule Take 1 capsule (100 mg total) by mouth 2 (two) times daily for 7 days. 10/29/22 11/05/22 Yes Jezlyn Westerfield, Janace Aris, MD  predniSONE (DELTASONE) 20 MG tablet Take 2 tablets (40 mg total) by mouth daily with breakfast for 5 days. 10/29/22 11/03/22 Yes Harlee Eckroth, Janace Aris, MD  traMADol (ULTRAM) 50 MG tablet Take 1 tablet (50 mg total) by mouth every 6 (six) hours as needed (pain). 10/29/22  Yes Zenia Resides, MD  Bacillus Coagulans-Inulin (PROBIOTIC) 1-250 BILLION-MG CAPS  01/02/22   [provider]  Collagen Hydrolysate POWD Take 1 Scoop by mouth daily at 6 (six) AM.     [provider]  fluticasone (FLONASE) 50 MCG/ACT nasal spray Place 1 spray into both nostrils daily. Patient taking differently: Place 1 spray into both nostrils daily as needed for allergies. 02/21/19   Dahlia Byes A, NP  Multiple Vitamin (MULTIVITAMIN) tablet Take 1 tablet by mouth daily.    [provider]  Polyethyl Glycol-Propyl Glycol (SYSTANE OP) Place 1-2 drops into both eyes 2 (two) times daily.    [provider]  pseudoephedrine (SUDAFED) 30 MG tablet Take 30 mg by mouth daily as needed for congestion.    [provider]  UNABLE TO FIND Med Name: DIVI hair vitamins    [provider]  Zinc 30 MG TABS Take by mouth.    [provider]    Family History Family History  Problem Relation Age of Onset   Pancreatic cancer Mother 12       d.73 - two months after diagnosis   Depression Mother    Diabetes Mother        at end of life   Hearing loss Father    Heart attack Father 74   Heart disease Father    Hypertension Sister    Arthritis Sister    Mental illness Sister        closed head injury as teen   Alcohol abuse Sister    Colon polyps Brother 45   Alcohol abuse Brother    Diabetes Brother    Arthritis Brother    Heart failure Brother 7   Atrial fibrillation Brother    Liver disease Brother    Alcohol abuse Maternal Grandmother    Drug abuse Maternal Grandfather    Heart attack Paternal Grandfather    Melanoma Daughter 66   Addison's disease Maternal Aunt 30   Leukemia Maternal Uncle 83       d.59 - shortly after diagnosis   Esophageal cancer Neg Hx    Rectal cancer Neg Hx    Stomach cancer Neg Hx    Colon cancer Neg Hx     Social History Social History   Tobacco Use   Smoking status: Former    Packs/day: 0.25    Years: 7.00    Additional pack years: 0.00    Total pack years: 1.75    Types: Cigarettes    Quit date: 11/07/1985    Years since quitting: 37.0   Smokeless tobacco: Never  Vaping Use    Vaping Use: Never used  Substance Use Topics   Alcohol use: No   Drug use: No     Allergies   Patient has no known allergies.   Review of Systems Review of Systems   Physical Exam Triage Vital Signs ED Triage Vitals  Enc Vitals Group     BP 10/29/22 1337 (!) 153/85     Pulse Rate 10/29/22 1337 74  Resp 10/29/22 1337 16     Temp 10/29/22 1337 98.5 F (36.9 C)     Temp src --      SpO2 10/29/22 1337 94 %     Weight --      Height --      Head Circumference --      Peak Flow --      Pain Score 10/29/22 1333 8     Pain Loc --      Pain Edu? --      Excl. in GC? --    No data found.  Updated Vital Signs BP (!) 153/85   Pulse 74   Temp 98.5 F (36.9 C)   Resp 16   SpO2 94%   Visual Acuity Right Eye Distance:   Left Eye Distance:   Bilateral Distance:    Right Eye Near:   Left Eye Near:    Bilateral Near:     Physical Exam Vitals reviewed.  Constitutional:      General: She is not in acute distress.    Appearance: She is not ill-appearing, toxic-appearing or diaphoretic.  HENT:     Mouth/Throat:     Mouth: Mucous membranes are moist.  Eyes:     Extraocular Movements: Extraocular movements intact.     Conjunctiva/sclera: Conjunctivae normal.     Pupils: Pupils are equal, round, and reactive to light.  Cardiovascular:     Rate and Rhythm: Normal rate and regular rhythm.     Heart sounds: No murmur heard. Pulmonary:     Effort: Pulmonary effort is normal.     Breath sounds: Normal breath sounds.  Musculoskeletal:     Cervical back: Neck supple.     Comments: There is a lot of tenderness over her left upper anterior chest and clavicle.  There is a little bit of tenderness over her anterior left shoulder.  Lymphadenopathy:     Cervical: No cervical adenopathy.  Skin:    Coloration: Skin is not pale.     Comments: There is a mildly indurated erythematous area that is 3 cm x 1 cm on her left anterior chest a couple of centimeters below her clavicle.   There is some mildly erythematous rash around that extending about 15 cm in diameter.  It is target-like with some clearing that is about 2 cm wide, and erythema begins again about a centimeter from where the rash itches.  Neurological:     General: No focal deficit present.     Mental Status: She is alert and oriented to person, place, and time.      UC Treatments / Results  Labs (all labs ordered are listed, but only abnormal results are displayed) Labs Reviewed - No data to display  EKG   Radiology DG Shoulder Left  Result Date: 10/29/2022 CLINICAL DATA:  Left shoulder pain for 2 days. Rash and itching overlying the pain. Pain started shortly after spider bite. EXAM: LEFT SHOULDER - 2+ VIEW COMPARISON:  None Available. FINDINGS: There is diffuse decreased bone mineralization. Mild posterior glenoid degenerative osteophytosis. Minimal peripheral acromioclavicular degenerative osteophytosis. No acute fracture or dislocation. The cortices are intact. IMPRESSION: Mild glenohumeral and acromioclavicular osteoarthritis. Electronically Signed   By: Neita Garnet M.D.   On: 10/29/2022 14:44    Procedures Procedures (including critical care time)  Medications Ordered in UC Medications  ketorolac (TORADOL) 30 MG/ML injection 30 mg (has no administration in time range)    Initial Impression / Assessment and Plan / UC  Course  I have reviewed the triage vital signs and the nursing notes.  Pertinent labs & imaging results that were available during my care of the patient were reviewed by me and considered in my medical decision making (see chart for details).        X-ray shows some degenerative changes.  No lytic lesions.  Toradol is given for pain.  Prednisone is sent in for the dermatitis and doxycycline is sent in because of the target lesion and risk of Lyme disease. Final Clinical Impressions(s) / UC Diagnoses   Final diagnoses:  Acute pain of left shoulder  Dermatitis   Target rash     Discharge Instructions      Your x-ray showed some arthritis but no sign of any metastatic cancer.  You have been given a shot of Toradol 30 mg today.  Take tramadol 50 mg-- 1 tablet every 6 hours as needed for pain.  This medication can make you sleepy or dizzy  Take prednisone 20 mg--2 daily for 5 days  Take doxycycline 100 mg --1 capsule 2 times daily for 7 days       ED Prescriptions     Medication Sig Dispense Auth. Provider   predniSONE (DELTASONE) 20 MG tablet Take 2 tablets (40 mg total) by mouth daily with breakfast for 5 days. 10 tablet Zenia Resides, MD   doxycycline (VIBRAMYCIN) 100 MG capsule Take 1 capsule (100 mg total) by mouth 2 (two) times daily for 7 days. 14 capsule Seline Enzor, Janace Aris, MD   traMADol (ULTRAM) 50 MG tablet Take 1 tablet (50 mg total) by mouth every 6 (six) hours as needed (pain). 12 tablet Aricka Goldberger, Janace Aris, MD      I have reviewed the PDMP during this encounter.   Zenia Resides, MD 10/29/22 [phone]

## 2022-10-29 NOTE — ED Triage Notes (Signed)
Pt reports Lt neck pain and Lt shoulder pain that started on SAT. Shortly after a spider bite.

## 2022-11-01 ENCOUNTER — Ambulatory Visit (INDEPENDENT_AMBULATORY_CARE_PROVIDER_SITE_OTHER): Payer: Medicare Other | Admitting: Family Medicine

## 2022-11-01 ENCOUNTER — Encounter: Payer: Self-pay | Admitting: Family Medicine

## 2022-11-01 ENCOUNTER — Encounter: Payer: Self-pay | Admitting: Internal Medicine

## 2022-11-01 VITALS — BP 122/76 | HR 72 | Temp 97.6°F | Ht 62.0 in | Wt 179.0 lb

## 2022-11-01 DIAGNOSIS — L309 Dermatitis, unspecified: Secondary | ICD-10-CM | POA: Diagnosis not present

## 2022-11-01 DIAGNOSIS — S40262A Insect bite (nonvenomous) of left shoulder, initial encounter: Secondary | ICD-10-CM

## 2022-11-01 DIAGNOSIS — L920 Granuloma annulare: Secondary | ICD-10-CM | POA: Diagnosis not present

## 2022-11-01 DIAGNOSIS — W57XXXA Bitten or stung by nonvenomous insect and other nonvenomous arthropods, initial encounter: Secondary | ICD-10-CM

## 2022-11-01 NOTE — Patient Instructions (Signed)
I agree with the current treatment plan. Use cool compresses/cool showers as needed for itching. Follow up if you have any new symptoms or concerns.

## 2022-11-01 NOTE — Progress Notes (Signed)
Subjective:     Patient ID: Theresa Frazier, female    DOB: 25-May-1956, 67 y.o.   MRN: [id number]  Chief Complaint  Patient presents with   Insect Bite    Left collarbone spider bite happened Saturday. Saw dermatologist this morning and took away part of her concern. They increased her antibiotic, instead of 7 days she will be doing 14 of doxycycline. Already took 3 days of prednisone but did not want to finish due to feeling jittery.   Still just concerned about rash spreading     HPI Patient is in today for a 6 day hx of red, painful rash on her left upper shoulder, left lateral neck and left upper arm. She was bitten by an unknown insect while moving wood.   She was evaluated in urgent care 6 days ago and started on oral steroids as well as doxycycline.  She saw her dermatologist this morning and her doxycycline was extended from 7 days to 14 days due to possible tickborne illness.  Initially she had more pain and tenderness at her collarbone and with her left shoulder but that has improved somewhat.  No fever, chills, headache, vomiting.  No other rashes.  There are no preventive care reminders to display for this patient.   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 Though a pathogenic mutation was identified, increased risk for cancer is only associated with TWO (biallelic) pathogenic mutations. No additional mutations or variants of uncertain significance were identified. Testing was performed through [facility]'s 46-gene Common Hereditary Cancers Panel. [facility]'s Common Hereditary Cancers Panel includ   GERD (gastroesophageal reflux disease)    Hyperlipidemia    diet controlled   Inguinal hernia    RIGHT side   Osteopenia    Ovarian cancer (HCC) 2017    Past  Surgical History:  Procedure Laterality Date   ABDOMINAL HYSTERECTOMY Left 05/31/2016   Procedure: HYSTERECTOMY ABDOMINAL;  Surgeon: Adolphus Birchwood, MD;  Location: [facility];  Service: Gynecology;  Laterality: Left;   BREAST EXCISIONAL BIOPSY Right    BREAST SURGERY Right 1993   breast-calcification   COLONOSCOPY     INCISIONAL HERNIA REPAIR N/A 04/21/2021   Procedure: LAPAROSCOPIC INCISIONAL HERNIA WITH MESH;  Surgeon: Berna Bue, MD;  Location: [facility];  Service: General;  Laterality: N/A;   LAPAROTOMY N/A 05/31/2016   Procedure: EXPLORATORY LAPAROTOMY;  Surgeon: Adolphus Birchwood, MD;  Location: [facility];  Service: Gynecology;  Laterality: N/A;   OMENTECTOMY  05/31/2016   Procedure: OMENTECTOMY AND STAGING FOR OVARIAN CANCER;  Surgeon: Adolphus Birchwood, MD;  Location: [facility];  Service: Gynecology;;   SALPINGOOPHORECTOMY Right 05/31/2016   Procedure: BILATERAL SALPINGO OOPHORECTOMY;  Surgeon: Adolphus Birchwood, MD;  Location: [facility];  Service: Gynecology;  Laterality: Right;   TONSILLECTOMY     as a child   WISDOM TOOTH EXTRACTION  1975    Family History  Problem Relation Age of Onset   Pancreatic cancer Mother 14       d.73 - two months after diagnosis   Depression Mother    Diabetes Mother        at end of life   Hearing loss Father    Heart attack Father 52   Heart disease Father  Hypertension Sister    Arthritis Sister    Mental illness Sister        closed head injury as teen   Alcohol abuse Sister    Colon polyps Brother 2   Alcohol abuse Brother    Diabetes Brother    Arthritis Brother    Heart failure Brother 44   Atrial fibrillation Brother    Liver disease Brother    Alcohol abuse Maternal Grandmother    Drug abuse Maternal Grandfather    Heart attack Paternal Grandfather    Melanoma Daughter 37   Addison's disease Maternal Aunt 85   Leukemia Maternal Uncle 29       d.59 - shortly after diagnosis   Esophageal cancer Neg Hx    Rectal cancer Neg Hx    Stomach cancer Neg Hx     Colon cancer Neg Hx     Social History   Socioeconomic History   Marital status: Married    Spouse name: Not on file   Number of children: Not on file   Years of education: Not on file   Highest education level: Bachelor's degree (e.g., BA, AB, BS)  Occupational History   Not on file  Tobacco Use   Smoking status: Former    Packs/day: 0.25    Years: 7.00    Additional pack years: 0.00    Total pack years: 1.75    Types: Cigarettes    Quit date: 11/07/1985    Years since quitting: 37.0   Smokeless tobacco: Never  Vaping Use   Vaping Use: Never used  Substance and Sexual Activity   Alcohol use: No   Drug use: No   Sexual activity: Yes  Other Topics Concern   Not on file  Social History Narrative   Not on file   Social Determinants of Health   Financial Resource Strain: Low Risk  (10/31/2022)   Overall Financial Resource Strain (CARDIA)    Difficulty of Paying Living Expenses: Not hard at all  Food Insecurity: No Food Insecurity (10/31/2022)   Hunger Vital Sign    Worried About Running Out of Food in the Last Year: Never true    Ran Out of Food in the Last Year: Never true  Transportation Needs: No Transportation Needs (10/31/2022)   PRAPARE - Administrator, Civil Service (Medical): No    Lack of Transportation (Non-Medical): No  Physical Activity: Insufficiently Active (10/31/2022)   Exercise Vital Sign    Days of Exercise per Week: 2 days    Minutes of Exercise per Session: 30 min  Stress: Stress Concern Present (10/31/2022)   [employer] of Occupational Health - Occupational Stress Questionnaire    Feeling of Stress : To some extent  Social Connections: Socially Integrated (10/31/2022)   Social Connection and Isolation Panel [NHANES]    Frequency of Communication with Friends and Family: Twice a week    Frequency of Social Gatherings with Friends and Family: Once a week    Attends Religious Services: More than 4 times per year    Active Member  of [employer] or Organizations: Yes    Attends Engineer, structural: More than 4 times per year    Marital Status: Married  Catering manager Violence: Not on file    Outpatient Medications Prior to Visit  Medication Sig Dispense Refill   Bacillus Coagulans-Inulin (PROBIOTIC) 1-250 BILLION-MG CAPS      Collagen Hydrolysate POWD Take 1 Scoop by mouth daily at 6 (six) AM.  doxycycline (VIBRAMYCIN) 100 MG capsule Take 1 capsule (100 mg total) by mouth 2 (two) times daily for 7 days. 14 capsule 0   fluticasone (FLONASE) 50 MCG/ACT nasal spray Place 1 spray into both nostrils daily. (Patient taking differently: Place 1 spray into both nostrils daily as needed for allergies.) 16 g 2   Multiple Vitamin (MULTIVITAMIN) tablet Take 1 tablet by mouth daily.     Polyethyl Glycol-Propyl Glycol (SYSTANE OP) Place 1-2 drops into both eyes 2 (two) times daily.     pseudoephedrine (SUDAFED) 30 MG tablet Take 30 mg by mouth daily as needed for congestion.     traMADol (ULTRAM) 50 MG tablet Take 1 tablet (50 mg total) by mouth every 6 (six) hours as needed (pain). 12 tablet 0   UNABLE TO FIND Med Name: DIVI hair vitamins     predniSONE (DELTASONE) 20 MG tablet Take 2 tablets (40 mg total) by mouth daily with breakfast for 5 days. (Patient not taking: Reported on 11/01/2022) 10 tablet 0   Zinc 30 MG TABS Take by mouth.     No facility-administered medications prior to visit.    No Known Allergies  ROS     Objective:    Physical Exam  BP 122/76 (BP Location: Left Arm, Patient Position: Sitting, Cuff Size: Large)   Pulse 72   Temp 97.6 F (36.4 C) (Temporal)   Ht 5\' 2"  (1.575 m)   Wt 179 lb (81.2 kg)   SpO2 99%   BMI 32.74 kg/m  Wt Readings from Last 3 Encounters:  11/01/22 179 lb (81.2 kg)  05/21/22 178 lb (80.7 kg)  02/12/22 174 lb (78.9 kg)   Large erythematous lesion extending from her left lateral neck to her left upper chest wall and across to her left upper arm.  Area seems to be  fading per pictures on her phone.  Somewhat of a target like lesion.  No sign of secondary bacterial infection.  No drainage.     Assessment & Plan:   Problem List Items Addressed This Visit   None Visit Diagnoses     Insect bite of left shoulder, initial encounter    -  Primary   Dermatitis          Reviewed pictures on patient's phone for the past 6 days since her initial bite.  Reviewed urgent care visit notes.  She saw dermatology this morning.  The rash does appear to be fading and she is not systemically ill.  I agree with the plan of care from her dermatologist and she will complete a 14-day course of doxycycline.  She is no longer taking the oral steroids.  Discussed cool compresses to help with itching or a topical steroid. Follow-up if she develops any new or worsening symptoms.  I have discontinued Kang Lesko. Forgette's Zinc and predniSONE. I am also having her maintain her fluticasone, Collagen Hydrolysate, Polyethyl Glycol-Propyl Glycol (SYSTANE OP), pseudoephedrine, multivitamin, Probiotic, UNABLE TO FIND, doxycycline, and traMADol.  No orders of the defined types were placed in this encounter.

## 2022-12-31 ENCOUNTER — Encounter: Payer: Self-pay | Admitting: Family Medicine

## 2022-12-31 ENCOUNTER — Ambulatory Visit (INDEPENDENT_AMBULATORY_CARE_PROVIDER_SITE_OTHER): Payer: Medicare Other | Admitting: Family Medicine

## 2022-12-31 VITALS — BP 118/78 | HR 72 | Temp 97.5°F | Resp 20 | Ht 62.0 in | Wt 179.0 lb

## 2022-12-31 DIAGNOSIS — R42 Dizziness and giddiness: Secondary | ICD-10-CM

## 2022-12-31 LAB — COMPREHENSIVE METABOLIC PANEL
ALT: 18 U/L (ref 0–35)
AST: 16 U/L (ref 0–37)
Albumin: 4.1 g/dL (ref 3.5–5.2)
Alkaline Phosphatase: 75 U/L (ref 39–117)
BUN: 16 mg/dL (ref 6–23)
CO2: 29 mEq/L (ref 19–32)
Calcium: 9.7 mg/dL (ref 8.4–10.5)
Chloride: 103 mEq/L (ref 96–112)
Creatinine, Ser: 0.89 mg/dL (ref 0.40–1.20)
GFR: 66.95 mL/min (ref >60.00)
Glucose, Bld: 94 mg/dL (ref 70–99)
Potassium: 4.4 mEq/L (ref 3.5–5.1)
Sodium: 138 mEq/L (ref 135–145)
Total Bilirubin: 0.5 mg/dL (ref 0.2–1.2)
Total Protein: 6.4 g/dL (ref 6.0–8.3)

## 2022-12-31 LAB — CBC WITH DIFFERENTIAL/PLATELET
Basophils Absolute: 0.1 10*3/uL (ref 0.0–0.1)
Basophils Relative: 1 % (ref 0.0–3.0)
Eosinophils Absolute: 0.2 10*3/uL (ref 0.0–0.7)
Eosinophils Relative: 3.2 % (ref 0.0–5.0)
HCT: 43 % (ref 36.0–46.0)
Hemoglobin: 14 g/dL (ref 12.0–15.0)
Lymphocytes Relative: 33.1 % (ref 12.0–46.0)
Lymphs Abs: 2 10*3/uL (ref 0.7–4.0)
MCHC: 32.6 g/dL (ref 30.0–36.0)
MCV: 93.5 fl (ref 78.0–100.0)
Monocytes Absolute: 0.5 10*3/uL (ref 0.1–1.0)
Monocytes Relative: 8.1 % (ref 3.0–12.0)
Neutro Abs: 3.4 10*3/uL (ref 1.4–7.7)
Neutrophils Relative %: 54.6 % (ref 43.0–77.0)
Platelets: 246 10*3/uL (ref 150.0–400.0)
RBC: 4.61 Mil/uL (ref 3.87–5.11)
RDW: 13.1 % (ref 11.5–15.5)
WBC: 6.2 10*3/uL (ref 4.0–10.5)

## 2022-12-31 LAB — TSH: TSH: 2.94 u[IU]/mL (ref 0.35–5.50)

## 2022-12-31 LAB — VITAMIN B12: Vitamin B-12: 484 pg/mL (ref 211–911)

## 2022-12-31 LAB — MAGNESIUM: Magnesium: 1.9 mg/dL (ref 1.5–2.5)

## 2022-12-31 NOTE — Progress Notes (Signed)
Assessment & Plan:  1. Dizziness Discussed multiple potential causes.  Labs to ensure there have been no changes.  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  Education provided on dizziness. - CBC with Differential/Platelet - Comprehensive metabolic panel - TSH - Vitamin B12 - Magnesium   Follow up plan: Return if symptoms worsen or fail to improve.  Deliah Boston, MSN, APRN, FNP-C  Subjective:  HPI: Theresa Frazier is a 67 y.o. female presenting on 12/31/2022 for Dizziness (X 6 days  - HA/ pressure - no nausea/(Thinks it may be related to some neck pain) /2 at home covid NEG)  She reports new onset dizziness.  Symptoms started one week ago and are better today.  She describes it as  not having a grounding; like having sand in your head  and occurs constantly. It is usually relieved by lying down and closing eyes. She has not started new medications around the time the dizziness started.  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  She also reports she feels like her glasses need to be updated.  Her last eye appointment was in February of this year.  She has tried taking Sudafed and using a Nettie pot, which did not resolve her symptoms.  She took two home COVID test which were both negative.  She starts off her day drinking well, but then does not do well the rest of the day.  She was with her grandchildren when her symptoms started and admits she hardly drinks anything when she has them.  Associated symptoms: No hearing loss No tinnitus  No chest discomfort No heart palpitations  No heart racing No numbness or tingling of extremities  No nausea No vomiting  No speech difficulty No visual changes    Wt Readings from Last 3 Encounters:  12/31/22 179 lb (81.2 kg)   11/01/22 179 lb (81.2 kg)  05/21/22 178 lb (80.7 kg)    BP Readings from Last 3 Encounters:  12/31/22 118/78  11/01/22 122/76  10/29/22 (!) 153/85      Lab Results  Component Value Date   WBC 5.3 02/12/2022   HGB 13.6 02/12/2022   HCT 41.0 02/12/2022   MCV 92.6 02/12/2022   PLT 228.0 02/12/2022   Lab Results  Component Value Date   NA 139 02/12/2022   K 4.0 02/12/2022   CO2 28 02/12/2022   BUN 11 02/12/2022   CREATININE 0.83 02/12/2022   CALCIUM 9.4 02/12/2022   GLUCOSE 90 02/12/2022     ---------------------------------------------------------------------------------------------------    ROS: Negative unless specifically indicated above in HPI.   Relevant past medical history reviewed and updated as indicated.   Allergies and medications reviewed and updated.   Current Outpatient Medications:    Bacillus Coagulans-Inulin (PROBIOTIC) 1-250 BILLION-MG CAPS, , Disp: , Rfl:    Collagen Hydrolysate POWD, Take 1 Scoop by mouth daily at 6 (six) AM., Disp: , Rfl:    fluticasone (FLONASE) 50 MCG/ACT nasal spray, Place 1 spray into both nostrils daily. (Patient taking differently: Place 1 spray into both nostrils daily as needed for allergies.), Disp: 16 g, Rfl: 2   Multiple Vitamin (MULTIVITAMIN) tablet, Take 1 tablet by mouth daily., Disp: , Rfl:    Polyethyl Glycol-Propyl Glycol (SYSTANE OP), Place 1-2 drops into both eyes 2 (two) times  daily., Disp: , Rfl:    pseudoephedrine (SUDAFED) 30 MG tablet, Take 30 mg by mouth daily as needed for congestion., Disp: , Rfl:    UNABLE TO FIND, Med Name: DIVI hair vitamins, Disp: , Rfl:   No Known Allergies  Objective:   BP 118/78   Pulse 72   Temp (!) 97.5 F (36.4 C)   Resp 20   Ht 5\' 2"  (1.575 m)   Wt 179 lb (81.2 kg)   BMI 32.74 kg/m    Physical Exam Vitals reviewed.  Constitutional:      General: She is not in acute distress.    Appearance: Normal appearance. She is not ill-appearing, toxic-appearing or  diaphoretic.  HENT:     Head: Normocephalic and atraumatic.     Right Ear: Tympanic membrane, ear canal and external ear normal. There is no impacted cerumen.     Left Ear: Tympanic membrane, ear canal and external ear normal. There is no impacted cerumen.     Nose: Nose normal. No congestion or rhinorrhea.     Mouth/Throat:     Mouth: Mucous membranes are moist.     Pharynx: Oropharynx is clear. No oropharyngeal exudate or posterior oropharyngeal erythema.  Eyes:     General: No scleral icterus.       Right eye: No discharge.        Left eye: No discharge.     Conjunctiva/sclera: Conjunctivae normal.  Cardiovascular:     Rate and Rhythm: Normal rate and regular rhythm.     Heart sounds: Normal heart sounds. No murmur heard.    No friction rub. No gallop.  Pulmonary:     Effort: Pulmonary effort is normal. No respiratory distress.     Breath sounds: Normal breath sounds. No stridor. No wheezing, rhonchi or rales.  Musculoskeletal:        General: Normal range of motion.     Cervical back: Normal range of motion.  Lymphadenopathy:     Cervical: No cervical adenopathy.  Skin:    General: Skin is warm and dry.     Capillary Refill: Capillary refill takes less than 2 seconds.  Neurological:     General: No focal deficit present.     Mental Status: She is alert and oriented to person, place, and time. Mental status is at baseline.     Motor: Motor function is intact.     Coordination: Coordination is intact.     Gait: Gait is intact.  Psychiatric:        Mood and Affect: Mood normal.        Behavior: Behavior normal.        Thought Content: Thought content normal.        Judgment: Judgment normal.

## 2023-01-02 DIAGNOSIS — L821 Other seborrheic keratosis: Secondary | ICD-10-CM | POA: Diagnosis not present

## 2023-01-02 DIAGNOSIS — L814 Other melanin hyperpigmentation: Secondary | ICD-10-CM | POA: Diagnosis not present

## 2023-01-02 DIAGNOSIS — D225 Melanocytic nevi of trunk: Secondary | ICD-10-CM | POA: Diagnosis not present

## 2023-01-02 DIAGNOSIS — L578 Other skin changes due to chronic exposure to nonionizing radiation: Secondary | ICD-10-CM | POA: Diagnosis not present

## 2023-02-21 DIAGNOSIS — K080 Exfoliation of teeth due to systemic causes: Secondary | ICD-10-CM | POA: Diagnosis not present

## 2023-02-21 DIAGNOSIS — H524 Presbyopia: Secondary | ICD-10-CM | POA: Diagnosis not present

## 2023-03-15 ENCOUNTER — Telehealth: Payer: Self-pay | Admitting: Internal Medicine

## 2023-03-15 DIAGNOSIS — R7303 Prediabetes: Secondary | ICD-10-CM

## 2023-03-15 DIAGNOSIS — Z8543 Personal history of malignant neoplasm of ovary: Secondary | ICD-10-CM

## 2023-03-15 NOTE — Telephone Encounter (Signed)
Patient is scheduled for her physical in December, but she would like to know if she can have blood work done sooner instead of waiting until then. She would like to have CA 125 checked due to previous ovarian cancer. She said she feels fine, but had a bad feeling and would like to make sure to put her mind at ease. Patient would like a call back at [phone].

## 2023-03-20 DIAGNOSIS — R7303 Prediabetes: Secondary | ICD-10-CM | POA: Insufficient documentation

## 2023-03-20 NOTE — Addendum Note (Signed)
Addended by: Hillard Danker A on: 03/20/2023 10:47 AM   Modules accepted: Orders

## 2023-03-20 NOTE — Telephone Encounter (Signed)
Labs placed okay to get anytime

## 2023-03-20 NOTE — Telephone Encounter (Signed)
Pt has been informed and will come in to get her labs done

## 2023-03-22 ENCOUNTER — Other Ambulatory Visit (INDEPENDENT_AMBULATORY_CARE_PROVIDER_SITE_OTHER): Payer: Medicare Other

## 2023-03-22 DIAGNOSIS — Z8543 Personal history of malignant neoplasm of ovary: Secondary | ICD-10-CM | POA: Diagnosis not present

## 2023-03-22 DIAGNOSIS — R7303 Prediabetes: Secondary | ICD-10-CM | POA: Diagnosis not present

## 2023-03-22 LAB — COMPREHENSIVE METABOLIC PANEL
ALT: 19 U/L (ref 0–35)
AST: 19 U/L (ref 0–37)
Albumin: 4.1 g/dL (ref 3.5–5.2)
Alkaline Phosphatase: 80 U/L (ref 39–117)
BUN: 11 mg/dL (ref 6–23)
CO2: 29 meq/L (ref 19–32)
Calcium: 9.3 mg/dL (ref 8.4–10.5)
Chloride: 105 meq/L (ref 96–112)
Creatinine, Ser: 0.78 mg/dL (ref 0.40–1.20)
GFR: 78.31 mL/min (ref >60.00)
Glucose, Bld: 100 mg/dL — ABNORMAL HIGH (ref 70–99)
Potassium: 4.5 meq/L (ref 3.5–5.1)
Sodium: 139 meq/L (ref 135–145)
Total Bilirubin: 0.6 mg/dL (ref 0.2–1.2)
Total Protein: 6.4 g/dL (ref 6.0–8.3)

## 2023-03-22 LAB — HEMOGLOBIN A1C: Hgb A1c MFr Bld: 5.8 % (ref 4.6–6.5)

## 2023-03-22 LAB — LIPID PANEL
Cholesterol: 220 mg/dL — ABNORMAL HIGH (ref 0–200)
HDL: 69.8 mg/dL (ref >39.00)
LDL Cholesterol: 136 mg/dL — ABNORMAL HIGH (ref 0–99)
NonHDL: 149.75
Total CHOL/HDL Ratio: 3
Triglycerides: 67 mg/dL (ref 0.0–149.0)
VLDL: 13.4 mg/dL (ref 0.0–40.0)

## 2023-03-22 LAB — CBC
HCT: 43.6 % (ref 36.0–46.0)
Hemoglobin: 13.9 g/dL (ref 12.0–15.0)
MCHC: 32 g/dL (ref 30.0–36.0)
MCV: 94.5 fL (ref 78.0–100.0)
Platelets: 238 10*3/uL (ref 150.0–400.0)
RBC: 4.61 Mil/uL (ref 3.87–5.11)
RDW: 13 % (ref 11.5–15.5)
WBC: 4.6 10*3/uL (ref 4.0–10.5)

## 2023-03-25 LAB — CA 125: CA 125: 10 U/mL

## 2023-04-02 ENCOUNTER — Encounter: Payer: Self-pay | Admitting: Internal Medicine

## 2023-04-08 ENCOUNTER — Ambulatory Visit: Payer: Medicare Other | Admitting: Family Medicine

## 2023-04-08 VITALS — BP 132/70 | HR 80 | Temp 98.3°F | Resp 20 | Ht 62.0 in | Wt 183.0 lb

## 2023-04-08 DIAGNOSIS — W57XXXA Bitten or stung by nonvenomous insect and other nonvenomous arthropods, initial encounter: Secondary | ICD-10-CM | POA: Diagnosis not present

## 2023-04-08 DIAGNOSIS — S50861A Insect bite (nonvenomous) of right forearm, initial encounter: Secondary | ICD-10-CM | POA: Diagnosis not present

## 2023-04-08 DIAGNOSIS — A6920 Lyme disease, unspecified: Secondary | ICD-10-CM

## 2023-04-08 MED ORDER — DOXYCYCLINE HYCLATE 100 MG PO TABS
100.0000 mg | ORAL_TABLET | Freq: Two times a day (BID) | ORAL | 0 refills | Status: DC
Start: 2023-04-08 — End: 2023-04-15

## 2023-04-08 NOTE — Progress Notes (Signed)
Assessment & Plan:  1-2. Lyme disease/Tick bite of right forearm, initial encounter Education provided on Lyme disease. - doxycycline (VIBRA-TABS) 100 MG tablet; Take 1 tablet (100 mg total) by mouth 2 (two) times daily for 10 days.  Dispense: 20 tablet; Refill: 0   Follow up plan: Return if symptoms worsen or fail to improve.  Deliah Boston, MSN, APRN, FNP-C  Subjective:  HPI: Theresa Frazier is a 67 y.o. female presenting on 04/08/2023 for Insect Bite (Tick bite to right forearm area (small deer tick) pulled off on SUN AM - thinks attached SAT night )  Patient reports she got a tick off the right arm two days ago.  Denies headaches, increased body aches, fatigue, fever, nausea, and vomiting.  She reports a red ring appeared around the area this morning.   ROS: Negative unless specifically indicated above in HPI.   Relevant past medical history reviewed and updated as indicated.   Allergies and medications reviewed and updated.   Current Outpatient Medications:    Bacillus Coagulans-Inulin (PROBIOTIC) 1-250 BILLION-MG CAPS, , Disp: , Rfl:    Collagen Hydrolysate POWD, Take 1 Scoop by mouth daily at 6 (six) AM., Disp: , Rfl:    Multiple Vitamin (MULTIVITAMIN) tablet, Take 1 tablet by mouth daily., Disp: , Rfl:    Polyethyl Glycol-Propyl Glycol (SYSTANE OP), Place 1-2 drops into both eyes 2 (two) times daily., Disp: , Rfl:    pseudoephedrine (SUDAFED) 30 MG tablet, Take 30 mg by mouth daily as needed for congestion., Disp: , Rfl:    UNABLE TO FIND, Med Name: DIVI hair vitamins, Disp: , Rfl:    fluticasone (FLONASE) 50 MCG/ACT nasal spray, Place 1 spray into both nostrils daily. (Patient not taking: Reported on 04/08/2023), Disp: 16 g, Rfl: 2  No Known Allergies  Objective:   BP 132/70   Pulse 80   Temp 98.3 F (36.8 C)   Resp 20   Ht 5\' 2"  (1.575 m)   Wt 183 lb (83 kg)   BMI 33.47 kg/m    Physical Exam Vitals reviewed.  Constitutional:      General: She is not in  acute distress.    Appearance: Normal appearance. She is not ill-appearing, toxic-appearing or diaphoretic.  HENT:     Head: Normocephalic and atraumatic.  Eyes:     General: No scleral icterus.       Right eye: No discharge.        Left eye: No discharge.     Conjunctiva/sclera: Conjunctivae normal.  Cardiovascular:     Rate and Rhythm: Normal rate.  Pulmonary:     Effort: Pulmonary effort is normal. No respiratory distress.  Musculoskeletal:        General: Normal range of motion.     Cervical back: Normal range of motion.  Skin:    General: Skin is warm and dry.     Capillary Refill: Capillary refill takes less than 2 seconds.     Comments: Area of tick bite on right forearm with a light red ring surrounding it.  Neurological:     General: No focal deficit present.     Mental Status: She is alert and oriented to person, place, and time. Mental status is at baseline.  Psychiatric:        Mood and Affect: Mood normal.        Behavior: Behavior normal.        Thought Content: Thought content normal.        Judgment:  Judgment normal.

## 2023-04-15 ENCOUNTER — Telehealth: Payer: Self-pay | Admitting: Internal Medicine

## 2023-04-15 ENCOUNTER — Ambulatory Visit: Payer: Medicare Other | Admitting: Family Medicine

## 2023-04-15 ENCOUNTER — Encounter: Payer: Self-pay | Admitting: Family Medicine

## 2023-04-15 VITALS — BP 124/60 | Temp 98.3°F | Resp 20 | Ht 62.0 in | Wt 183.0 lb

## 2023-04-15 DIAGNOSIS — A6920 Lyme disease, unspecified: Secondary | ICD-10-CM | POA: Diagnosis not present

## 2023-04-15 DIAGNOSIS — R6889 Other general symptoms and signs: Secondary | ICD-10-CM

## 2023-04-15 LAB — POCT INFLUENZA A/B
Influenza A, POC: NEGATIVE
Influenza B, POC: NEGATIVE

## 2023-04-15 MED ORDER — DOXYCYCLINE HYCLATE 100 MG PO TABS
100.0000 mg | ORAL_TABLET | Freq: Two times a day (BID) | ORAL | 0 refills | Status: AC
Start: 2023-04-15 — End: 2023-05-03

## 2023-04-15 NOTE — Telephone Encounter (Signed)
I recommend she be evaluated as respiratory symptoms would not be consistent with a tick bite.

## 2023-04-15 NOTE — Telephone Encounter (Signed)
Patient was seen on 04/08/2023 by Deliah Boston for a tick bite. She was prescribed an antibiotic, which won't be completed until 04/17/2023. Patient said she is not feeling well. She said she has a headache and neck ache. She said she is feeling some respiratory symptoms. She took a Covid test on 04/14/2023 and it was negative. Patient would like to know what to do. Best callback is [phone].

## 2023-04-15 NOTE — Telephone Encounter (Signed)
Pt called and aware ntbs - appt made for today at 400 pm with [facility]

## 2023-04-15 NOTE — Progress Notes (Unsigned)
Assessment & Plan:  1. Lyme disease Education provided on Lyme disease.  Doxycycline extended for total of 28 days due to new symptoms. - doxycycline (VIBRA-TABS) 100 MG tablet; Take 1 tablet (100 mg total) by mouth 2 (two) times daily for 18 days.  Dispense: 36 tablet; Refill: 0  2. Flu-like symptoms - POCT Influenza A/B Results for orders placed or performed in visit on 04/15/23  POCT Influenza A/B  Result Value Ref Range   Influenza A, POC Negative Negative   Influenza B, POC Negative Negative    Follow up plan: Return if symptoms worsen or fail to improve.  Deliah Boston, MSN, APRN, FNP-C  Subjective:  HPI: ILAISAANE HENDRICKSON is a 67 y.o. female presenting on 04/15/2023 for Headache (Back of neck hurts, Head pressure, facial pressure, mouth dry, some dizziness, some nausea =  no drainage, no fever, no cough/Home test for covid NEG/On DOXY until wed 11/13./)  Patient was seen on 04/08/2023 after a tick bite at which time she was diagnosed with Lyme disease and treated with doxycycline 100 mg twice daily x 10 days due to erythema migrans.  Since that time she has developed a headache, neck pain, nausea, and fatigue.  She is drinking plenty of fluids.  She did a COVID test at home which was negative.   ROS: Negative unless specifically indicated above in HPI.   Relevant past medical history reviewed and updated as indicated.   Allergies and medications reviewed and updated.   Current Outpatient Medications:    Bacillus Coagulans-Inulin (PROBIOTIC) 1-250 BILLION-MG CAPS, , Disp: , Rfl:    Collagen Hydrolysate POWD, Take 1 Scoop by mouth daily at 6 (six) AM., Disp: , Rfl:    doxycycline (VIBRA-TABS) 100 MG tablet, Take 1 tablet (100 mg total) by mouth 2 (two) times daily for 10 days., Disp: 20 tablet, Rfl: 0   Multiple Vitamin (MULTIVITAMIN) tablet, Take 1 tablet by mouth daily., Disp: , Rfl:    Polyethyl Glycol-Propyl Glycol (SYSTANE OP), Place 1-2 drops into both eyes 2 (two)  times daily., Disp: , Rfl:    pseudoephedrine (SUDAFED) 30 MG tablet, Take 30 mg by mouth daily as needed for congestion., Disp: , Rfl:    UNABLE TO FIND, Med Name: DIVI hair vitamins, Disp: , Rfl:    fluticasone (FLONASE) 50 MCG/ACT nasal spray, Place 1 spray into both nostrils daily. (Patient not taking: Reported on 04/15/2023), Disp: 16 g, Rfl: 2  No Known Allergies  Objective:   BP 124/60   Temp 98.3 F (36.8 C)   Resp 20   Ht 5\' 2"  (1.575 m)   Wt 183 lb (83 kg)   BMI 33.47 kg/m    Physical Exam Vitals reviewed.  Constitutional:      General: She is not in acute distress.    Appearance: Normal appearance. She is not ill-appearing, toxic-appearing or diaphoretic.  HENT:     Head: Normocephalic and atraumatic.     Right Ear: Tympanic membrane, ear canal and external ear normal. There is no impacted cerumen.     Left Ear: Tympanic membrane, ear canal and external ear normal. There is no impacted cerumen.     Nose: Nose normal. No congestion or rhinorrhea.     Right Sinus: No maxillary sinus tenderness or frontal sinus tenderness.     Left Sinus: No maxillary sinus tenderness or frontal sinus tenderness.     Mouth/Throat:     Mouth: Mucous membranes are moist.     Pharynx: Oropharynx  is clear. No oropharyngeal exudate or posterior oropharyngeal erythema.  Eyes:     General: No scleral icterus.       Right eye: No discharge.        Left eye: No discharge.     Conjunctiva/sclera: Conjunctivae normal.  Cardiovascular:     Rate and Rhythm: Normal rate and regular rhythm.     Heart sounds: Normal heart sounds. No murmur heard.    No friction rub. No gallop.  Pulmonary:     Effort: Pulmonary effort is normal. No respiratory distress.     Breath sounds: Normal breath sounds. No stridor. No wheezing, rhonchi or rales.  Musculoskeletal:        General: Normal range of motion.     Cervical back: Normal range of motion.  Lymphadenopathy:     Cervical: No cervical adenopathy.   Skin:    General: Skin is warm and dry.     Capillary Refill: Capillary refill takes less than 2 seconds.  Neurological:     General: No focal deficit present.     Mental Status: She is alert and oriented to person, place, and time. Mental status is at baseline.  Psychiatric:        Mood and Affect: Mood normal.        Behavior: Behavior normal.        Thought Content: Thought content normal.        Judgment: Judgment normal.

## 2023-04-17 DIAGNOSIS — K080 Exfoliation of teeth due to systemic causes: Secondary | ICD-10-CM | POA: Diagnosis not present

## 2023-04-25 ENCOUNTER — Telehealth: Payer: Self-pay | Admitting: *Deleted

## 2023-04-25 DIAGNOSIS — C569 Malignant neoplasm of unspecified ovary: Secondary | ICD-10-CM

## 2023-04-25 NOTE — Telephone Encounter (Signed)
Spoke with Theresa Frazier who called the office stating she last saw Dr. Andrey Farmer in December of 2022. She had hysterectomy BSO in December of 2017 and continued 5 years with Dr. Andrey Farmer. Her PCP has been doing annual Ca 125's, and the last was normal. She has upcoming appt. With PCP in December. She is complaining of lower abdominal pressure with bloating and sensitivity on right lower pelvis. Calling today to see about follow up with our office. Advised pt that message would be relayed to providers and the office would call back with recommendations.

## 2023-04-30 NOTE — Telephone Encounter (Signed)
Spoke with Theresa Frazier and appointment made for patient with Dr. Tamela Oddi for Wednesday, December 11 th at 1245 and lab appointment for CEA at 1215. Pt agreed to date and time for appts. And thanked the office for calling.

## 2023-05-08 ENCOUNTER — Encounter: Payer: Self-pay | Admitting: Obstetrics & Gynecology

## 2023-05-13 ENCOUNTER — Other Ambulatory Visit: Payer: Self-pay | Admitting: Internal Medicine

## 2023-05-13 DIAGNOSIS — Z0000 Encounter for general adult medical examination without abnormal findings: Secondary | ICD-10-CM

## 2023-05-15 ENCOUNTER — Inpatient Hospital Stay: Payer: Medicare Other

## 2023-05-15 ENCOUNTER — Inpatient Hospital Stay: Payer: Medicare Other | Attending: Obstetrics & Gynecology | Admitting: Obstetrics & Gynecology

## 2023-05-15 ENCOUNTER — Encounter: Payer: Self-pay | Admitting: Obstetrics & Gynecology

## 2023-05-15 VITALS — BP 136/88 | HR 79 | Temp 98.5°F | Resp 20 | Wt 181.0 lb

## 2023-05-15 DIAGNOSIS — W57XXXS Bitten or stung by nonvenomous insect and other nonvenomous arthropods, sequela: Secondary | ICD-10-CM | POA: Diagnosis not present

## 2023-05-15 DIAGNOSIS — R1110 Vomiting, unspecified: Secondary | ICD-10-CM | POA: Insufficient documentation

## 2023-05-15 DIAGNOSIS — C569 Malignant neoplasm of unspecified ovary: Secondary | ICD-10-CM

## 2023-05-15 DIAGNOSIS — A6920 Lyme disease, unspecified: Secondary | ICD-10-CM | POA: Insufficient documentation

## 2023-05-15 DIAGNOSIS — Z8543 Personal history of malignant neoplasm of ovary: Secondary | ICD-10-CM | POA: Insufficient documentation

## 2023-05-15 DIAGNOSIS — R194 Change in bowel habit: Secondary | ICD-10-CM | POA: Diagnosis not present

## 2023-05-15 DIAGNOSIS — R630 Anorexia: Secondary | ICD-10-CM | POA: Insufficient documentation

## 2023-05-15 DIAGNOSIS — R140 Abdominal distension (gaseous): Secondary | ICD-10-CM | POA: Diagnosis not present

## 2023-05-15 LAB — CEA (ACCESS): CEA (CHCC): 1.17 ng/mL (ref 0.00–5.00)

## 2023-05-15 NOTE — Assessment & Plan Note (Addendum)
Ms. CARRIANNE CUTTING  is a 67 y.o. 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  Now s/p treatment for Lyme disease w/subacute GI complaints following a protracted course of abx    -Keep a symptom diary for the next two months. -Phone or virtual visit in two months to assess persistent symptoms. -If symptoms persist or worsen, order CT scan of abdomen and pelvis. -Continue monitoring tumor markers for trends.

## 2023-05-15 NOTE — Patient Instructions (Signed)
VVV or phone visit in 2 months

## 2023-05-15 NOTE — Progress Notes (Signed)
Follow Up Note: Gyn-Onc  Theresa Frazier 67 y.o. female  CC: She presents multiple, vague sxs   HPI: The oncology history was reviewed.  Interval History:  The patient, a survivor of mucinous ovarian cancer, presented with concerns of recurrent symptoms similar to those experienced prior to their initial diagnosis in 2017. They reported noticing these symptoms while on a 28-day course of antibiotics for a tick bite, which they had completed approximately a week to ten days prior to the consultation. The symptoms included bloating, changes in bowel movements, decreased appetite, and episodes of vomiting. The patient also reported a small nodule-like sensation in the abdominal area upon palpation, which was a new finding.  The patient expressed concern that these symptoms might be indicative of a recurrence of their ovarian cancer, particularly as they had been experiencing them around the anniversary of their initial diagnosis. However, they also acknowledged that the symptoms could be side effects of the recent antibiotic treatment. The patient had not experienced any weight loss and their energy levels remained good.  The patient had not had a pelvic examination for two years prior to this consultation. They had been keeping up with other health screenings, including a colonoscopy a year prior. The patient had been managing well until the recent onset of these symptoms. They had been proactive in monitoring their health, including regular lab work to track tumor markers.  The patient's history of cancer and recent antibiotic treatment for a tick bite provide plausible explanations for their current symptoms. A CA 125 in 10/24 was 10. A CA 125 in 12/23 was 9, CEA < 2. A CEA was < 0.5 in 12/22, CA 125 13.  . Review of Systems  Review of Systems  Constitutional:  Negative for malaise/fatigue and weight loss.  Respiratory:  Negative for shortness of breath and wheezing.   Cardiovascular:   Negative for chest pain and leg swelling.  Gastrointestinal:  Negative for abdominal pain, blood in stool, constipation, nausea and vomiting.  Genitourinary:  Negative for dysuria, frequency, hematuria and urgency.  Musculoskeletal:  Negative for joint pain and myalgias.  Neurological:  Negative for weakness.  Psychiatric/Behavioral:  Negative for depression. The patient does not have insomnia.    Current medications, allergy, social history, past surgical history, past medical history, family history were all reviewed.    Vitals:  BP 136/88 Comment: manual recheck  Pulse 79   Temp 98.5 F (36.9 C) (Oral)   Resp 20   Wt 181 lb (82.1 kg)   SpO2 99%   BMI 33.11 kg/m    Physical Exam:  Physical Exam Exam conducted with a chaperone present.  Constitutional:      General: She is not in acute distress. Cardiovascular:     Rate and Rhythm: Normal rate and regular rhythm.  Pulmonary:     Effort: Pulmonary effort is normal.     Breath sounds: Normal breath sounds. No wheezing or rhonchi.  Abdominal:     Palpations: Abdomen is soft. Nodule palpated within the scar--3 mm, NT    Tenderness: There is no abdominal tenderness. There is no right CVA tenderness or left CVA tenderness.     Hernia: No hernia is present.  Genitourinary:    General: Normal vulva.     Urethra: No urethral lesion.     Vagina: No lesions. No bleeding. Foreshortened, scarring at apex, rectocele Musculoskeletal:     Cervical back: Neck supple.     Right lower leg: No edema.  Left lower leg: No edema.  Lymphadenopathy:     Upper Body:     Right upper body: No supraclavicular adenopathy.     Left upper body: No supraclavicular adenopathy.     Lower Body: No right inguinal adenopathy. No left inguinal adenopathy.  Skin:    Findings: No rash.  Neurological:     Mental Status: She is oriented to person, place, and time.   Assessment/Plan:  Hx of ovarian cancer Ms. CHAILLE MICHAELS  is a 67 y.o. 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  Now s/p treatment for Lyme disease w/subacute GI complaints following a protracted course of abx    -Keep a symptom diary for the next two months. -Phone or virtual visit in two months to assess persistent symptoms. -If symptoms persist or worsen, order CT scan of abdomen and pelvis. -Continue monitoring tumor markers for trends.   I personally spent face-to-face and non-face-to-face in the care of this patient, which includes all pre, intra, and post visit time on the date of service.     Antionette Char, MD

## 2023-05-17 ENCOUNTER — Telehealth: Payer: Self-pay | Admitting: *Deleted

## 2023-05-17 NOTE — Telephone Encounter (Signed)
Attempted to reach patient to relay most recent lab results. Left voicemail requesting call back.

## 2023-05-17 NOTE — Telephone Encounter (Signed)
Ms.Ormand returned call. She is aware of the normal CEA result. She was thankful for the call.

## 2023-05-17 NOTE — Telephone Encounter (Signed)
-----   Message from Doylene Bode sent at 05/17/2023  8:36 AM EST ----- Please let her know her CEA is within normal range ----- Message ----- From: Interface, [facility] [place] Sent: 05/15/2023   9:11 PM EST To: Doylene Bode, NP

## 2023-05-23 ENCOUNTER — Ambulatory Visit: Payer: Medicare Other | Admitting: Internal Medicine

## 2023-05-23 ENCOUNTER — Encounter: Payer: Self-pay | Admitting: Internal Medicine

## 2023-05-23 VITALS — BP 122/80 | HR 78 | Temp 98.9°F | Ht 62.0 in | Wt 179.0 lb

## 2023-05-23 DIAGNOSIS — Z8543 Personal history of malignant neoplasm of ovary: Secondary | ICD-10-CM

## 2023-05-23 DIAGNOSIS — E782 Mixed hyperlipidemia: Secondary | ICD-10-CM

## 2023-05-23 DIAGNOSIS — Z0000 Encounter for general adult medical examination without abnormal findings: Secondary | ICD-10-CM

## 2023-05-23 DIAGNOSIS — R7303 Prediabetes: Secondary | ICD-10-CM

## 2023-05-23 NOTE — Assessment & Plan Note (Signed)
Labs stable recently and discussed diet and exercise to help. Continue monitoring every 6-12 months.

## 2023-05-23 NOTE — Progress Notes (Signed)
   Subjective:   Patient ID: Theresa Frazier, female    DOB: 1956-01-13, 67 y.o.   MRN: [id number]  HPI The patient is here for physical.  PMH, [facility], social history reviewed and updated  Review of Systems  Constitutional: Negative.   HENT: Negative.    Eyes: Negative.   Respiratory:  Negative for cough, chest tightness and shortness of breath.   Cardiovascular:  Negative for chest pain, palpitations and leg swelling.  Gastrointestinal:  Negative for abdominal distention, abdominal pain, constipation, diarrhea, nausea and vomiting.  Musculoskeletal: Negative.   Skin: Negative.   Neurological: Negative.   Psychiatric/Behavioral: Negative.      Objective:  Physical Exam Constitutional:      Appearance: She is well-developed.  HENT:     Head: Normocephalic and atraumatic.  Cardiovascular:     Rate and Rhythm: Normal rate and regular rhythm.  Pulmonary:     Effort: Pulmonary effort is normal. No respiratory distress.     Breath sounds: Normal breath sounds. No wheezing or rales.  Abdominal:     General: Bowel sounds are normal. There is no distension.     Palpations: Abdomen is soft.     Tenderness: There is no abdominal tenderness. There is no rebound.  Musculoskeletal:     Cervical back: Normal range of motion.  Skin:    General: Skin is warm and dry.  Neurological:     Mental Status: She is alert and oriented to person, place, and time.     Coordination: Coordination normal.     Vitals:   05/23/23 0800  BP: 122/80  Pulse: 78  Temp: 98.9 F (37.2 C)  TempSrc: Oral  SpO2: 97%  Weight: 179 lb (81.2 kg)  Height: 5\' 2"  (1.575 m)    Assessment & Plan:

## 2023-05-23 NOTE — Assessment & Plan Note (Signed)
Ca-125 and CEA negative recently.

## 2023-05-23 NOTE — Assessment & Plan Note (Signed)
Recent lipid panel improved and she will work on diet after holidays.

## 2023-05-23 NOTE — Assessment & Plan Note (Signed)
Flu shot up to date. Pneumonia complete. Shingrix complete. Tetanus up to date. Colonoscopy up to date. Mammogram up to date, pap smear aged out and dexa complete. Counseled about sun safety and mole surveillance. Counseled about the dangers of distracted driving. Given 10 year screening recommendations.

## 2023-06-28 ENCOUNTER — Ambulatory Visit
Admission: RE | Admit: 2023-06-28 | Discharge: 2023-06-28 | Disposition: A | Discharge status: Home / Self Care | Payer: Medicare Other | Source: Ambulatory Visit | Attending: Internal Medicine | Admitting: Internal Medicine

## 2023-06-28 DIAGNOSIS — Z1231 Encounter for screening mammogram for malignant neoplasm of breast: Secondary | ICD-10-CM | POA: Diagnosis not present

## 2023-06-28 DIAGNOSIS — Z0000 Encounter for general adult medical examination without abnormal findings: Secondary | ICD-10-CM

## 2023-07-01 ENCOUNTER — Encounter: Payer: Self-pay | Admitting: Internal Medicine

## 2023-07-01 LAB — HM MAMMOGRAPHY

## 2023-07-15 DIAGNOSIS — H524 Presbyopia: Secondary | ICD-10-CM | POA: Diagnosis not present

## 2023-07-18 ENCOUNTER — Ambulatory Visit: Payer: Medicare Other

## 2023-07-18 VITALS — Ht 62.0 in | Wt 179.0 lb

## 2023-07-18 DIAGNOSIS — Z0000 Encounter for general adult medical examination without abnormal findings: Secondary | ICD-10-CM

## 2023-07-18 NOTE — Patient Instructions (Addendum)
Ms. Wieand , Thank you for taking time to come for your Medicare Wellness Visit. I appreciate your ongoing commitment to your health goals. Please review the following plan we discussed and let me know if I can assist you in the future.   Referrals/Orders/Follow-Ups/Clinician Recommendations: Aim for 30 minutes of exercise or brisk walking, 6-8 glasses of water, and 5 servings of fruits and vegetables each day.   This is a list of the screening recommended for you and due dates:  Health Maintenance  Topic Date Due   COVID-19 Vaccine (7 - 2024-25 season) 04/17/2023   Medicare Annual Wellness Visit  07/17/2024   Mammogram  06/30/2025   DTaP/Tdap/Td vaccine (3 - Td or Tdap) 01/30/2027   Colon Cancer Screening  04/05/2028   Pneumonia Vaccine  Completed   Flu Shot  Completed   DEXA scan (bone density measurement)  Completed   Hepatitis C Screening  Completed   Zoster (Shingles) Vaccine  Completed   HPV Vaccine  Aged Out    Advanced directives: (Provided) Advance directive discussed with you today. I have provided a copy for you to complete at home and have notarized. Once this is complete, please bring a copy in to our office so we can scan it into your chart.   Next Medicare Annual Wellness Visit scheduled for next year: Yes - 07/20/2024

## 2023-07-18 NOTE — Progress Notes (Signed)
Subjective:   Theresa Frazier is a 68 y.o. female who presents for Medicare Annual (Subsequent) preventive examination.  Visit Complete: Virtual I connected with  Theresa Frazier on 07/18/23 by a audio enabled telemedicine application and verified that I am speaking with the correct person using two identifiers.  Patient Location: Home  Provider Location: Office/Clinic  I discussed the limitations of evaluation and management by telemedicine. The patient expressed understanding and agreed to proceed.  Vital Signs: Because this visit was a virtual/telehealth visit, some criteria may be missing or patient reported. Any vitals not documented were not able to be obtained and vitals that have been documented are patient reported. .  Cardiac Risk Factors include: advanced age (>19men, >40 women);dyslipidemia     Objective:    Today's Vitals   07/18/23 1000  Weight: 179 lb (81.2 kg)  Height: 5\' 2"  (1.575 m)   Body mass index is 32.74 kg/m.     07/18/2023    9:58 AM 05/30/2021    8:21 AM 04/18/2021    8:13 AM 05/24/2020    3:44 PM 05/30/2018   11:50 AM 02/28/2018    3:03 PM 11/13/2017    3:00 PM  Advanced Directives  Does Patient Have a Medical Advance Directive? No No No No No No No  Would patient like information on creating a medical advance directive? Yes (MAU/Ambulatory/Procedural Areas - Information given) No - Patient declined Yes (MAU/Ambulatory/Procedural Areas - Information given) No - Patient declined No - Patient declined No - Patient declined No - Patient declined    Current Medications (verified) Outpatient Encounter Medications as of 07/18/2023  Medication Sig   Bacillus Coagulans-Inulin (PROBIOTIC) 1-250 BILLION-MG CAPS    Collagen Hydrolysate POWD Take 1 Scoop by mouth daily at 6 (six) AM.   FLUZONE HIGH-DOSE 0.5 ML injection Inject 0.5 mLs into the muscle once.   magnesium gluconate (MAGONATE) 500 MG tablet Take 500 mg by mouth daily.   Multiple Vitamin  (MULTIVITAMIN) tablet Take 1 tablet by mouth daily.   Polyethyl Glycol-Propyl Glycol (SYSTANE OP) Place 1-2 drops into both eyes 2 (two) times daily.   Turmeric (QC TUMERIC COMPLEX PO) Take by mouth.   UNABLE TO FIND Med Name: DIVI hair vitamins   No facility-administered encounter medications on file as of 07/18/2023.    Allergies (verified) Patient has no known allergies.   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 Though a pathogenic mutation was identified, increased risk for cancer is only associated with TWO (biallelic) pathogenic mutations. No additional mutations or variants of uncertain significance were identified. Testing was performed through [facility]'s 46-gene Common Hereditary Cancers Panel. [facility]'s Common Hereditary Cancers Panel includ   GERD (gastroesophageal reflux disease)    Hyperlipidemia    diet controlled   Inguinal hernia    RIGHT side   Osteopenia    Ovarian cancer (HCC) 2017   Past Surgical History:  Procedure Laterality Date   ABDOMINAL HYSTERECTOMY Left 05/31/2016   Procedure: HYSTERECTOMY ABDOMINAL;  Surgeon: Adolphus Birchwood, MD;  Location: [facility];  Service: Gynecology;  Laterality: Left;   BREAST EXCISIONAL BIOPSY Right    BREAST SURGERY Right 1993  breast-calcification   COLONOSCOPY     HERNIA REPAIR  Nov 2022   Incisional hernia repair with mesh   INCISIONAL HERNIA REPAIR N/A 04/21/2021   Procedure: LAPAROSCOPIC INCISIONAL HERNIA WITH MESH;  Surgeon: Berna Bue, MD;  Location: [facility];  Service: General;  Laterality: N/A;   LAPAROTOMY N/A 05/31/2016   Procedure: EXPLORATORY LAPAROTOMY;  Surgeon: Adolphus Birchwood, MD;  Location: [facility];  Service: Gynecology;  Laterality:  N/A;   OMENTECTOMY  05/31/2016   Procedure: OMENTECTOMY AND STAGING FOR OVARIAN CANCER;  Surgeon: Adolphus Birchwood, MD;  Location: [facility];  Service: Gynecology;;   SALPINGOOPHORECTOMY Right 05/31/2016   Procedure: BILATERAL SALPINGO OOPHORECTOMY;  Surgeon: Adolphus Birchwood, MD;  Location: [facility];  Service: Gynecology;  Laterality: Right;   TONSILLECTOMY     as a child   WISDOM TOOTH EXTRACTION  1975   Family History  Problem Relation Age of Onset   Pancreatic cancer Mother 39       d.73 - two months after diagnosis   Depression Mother    Diabetes Mother        at end of life   Cancer Mother    Hearing loss Father    Heart attack Father 13   Heart disease Father    Hypertension Sister    Arthritis Sister    Mental illness Sister        closed head injury as teen   Alcohol abuse Sister    Colon polyps Brother 73   Alcohol abuse Brother    Diabetes Brother    Arthritis Brother    Heart failure Brother 43   Atrial fibrillation Brother    Liver disease Brother    Heart disease Brother    Kidney disease Brother    Alcohol abuse Maternal Grandmother    Drug abuse Maternal Grandfather    Diabetes Maternal Grandfather    Heart attack Paternal Grandfather    Melanoma Daughter 59   Addison's disease Maternal Aunt 33   Leukemia Maternal Uncle 46       d.59 - shortly after diagnosis   Cancer Daughter    Esophageal cancer Neg Hx    Rectal cancer Neg Hx    Stomach cancer Neg Hx    Colon cancer Neg Hx    Social History   Socioeconomic History   Marital status: Married    Spouse name: Not on file   Number of children: Not on file   Years of education: Not on file   Highest education level: Bachelor's degree (e.g., BA, AB, BS)  Occupational History   Not on file  Tobacco Use   Smoking status: Former    Current packs/day: 0.00    Average packs/day: 0.3 packs/day for 7.0 years (1.8 ttl pk-yrs)    Types: Cigarettes    Start date: 11/08/1978    Quit date: 11/07/1985    Years since  quitting: 37.7   Smokeless tobacco: Never   Tobacco comments:    very brief in college; quit when pregnant  Vaping Use   Vaping status: Never Used  Substance and Sexual Activity   Alcohol use: No   Drug use: No   Sexual activity: Yes    Birth control/protection: Post-menopausal    Comment: I used diaphragm and then vasectomy  Other Topics Concern   Not on file  Social History Narrative   Not on file   Social Drivers of Health   Financial Resource Strain: Low Risk  (07/18/2023)   Overall Financial  Resource Strain (CARDIA)    Difficulty of Paying Living Expenses: Not hard at all  Food Insecurity: No Food Insecurity (07/18/2023)   Hunger Vital Sign    Worried About Running Out of Food in the Last Year: Never true    Ran Out of Food in the Last Year: Never true  Transportation Needs: No Transportation Needs (07/18/2023)   PRAPARE - Administrator, Civil Service (Medical): No    Lack of Transportation (Non-Medical): No  Physical Activity: Insufficiently Active (07/18/2023)   Exercise Vital Sign    Days of Exercise per Week: 2 days    Minutes of Exercise per Session: 20 min  Stress: Stress Concern Present (07/18/2023)   [employer] of Occupational Health - Occupational Stress Questionnaire    Feeling of Stress : To some extent  Social Connections: Socially Integrated (07/18/2023)   Social Connection and Isolation Panel [NHANES]    Frequency of Communication with Friends and Family: More than three times a week    Frequency of Social Gatherings with Friends and Family: More than three times a week    Attends Religious Services: More than 4 times per year    Active Member of [employer] or Organizations: Yes    Attends Engineer, structural: More than 4 times per year    Marital Status: Married    Tobacco Counseling Counseling given: Not Answered Tobacco comments: very brief in college; quit when pregnant   Clinical Intake:  Pre-visit preparation completed:  Yes  Pain : No/denies pain     BMI - recorded: 32.74 Diabetes: No  How often do you need to have someone help you when you read instructions, pamphlets, or other written materials from your doctor or pharmacy?: 1 - Never  Interpreter Needed?: No  Information entered by :: Hassell Halim, CMA   Activities of Daily Living    07/18/2023   10:03 AM 07/12/2023    2:37 PM  In your present state of health, do you have any difficulty performing the following activities:  Hearing? 0 0  Vision? 0 0  Difficulty concentrating or making decisions? 0 0  Walking or climbing stairs? 0 0  Dressing or bathing? 0 0  Doing errands, shopping? 0 0  Preparing Food and eating ? N N  Using the Toilet? N N  In the past six months, have you accidently leaked urine? Y Y  Do you have problems with loss of bowel control? N N  Managing your Medications? N N  Managing your Finances? N N  Housekeeping or managing your Housekeeping? N N    Patient Care Team: Myrlene Broker, MD as PCP - General (Internal Medicine) Violeta Gelinas, MD as Consulting Physician (General Surgery) Adolphus Birchwood, MD as Consulting Physician (Gynecologic Oncology) [place], Elvin So, MD as Referring Physician (Dermatology)  Indicate any recent Medical Services you may have received from other than [facility] providers in the past year (date may be approximate).     Assessment:   This is a routine wellness examination for Theresa Frazier.  Hearing/Vision screen Hearing Screening - Comments:: Denies hearing difficulties   Vision Screening - Comments:: Wears eyeglasses - Seen Dr Charlotte Sanes in 06/2023   Goals Addressed               This Visit's Progress     Patient Stated (pt-stated)        Patient stated she plans to continue to exercise.       Depression Screen    07/18/2023  10:16 AM 04/15/2023    4:04 PM 04/08/2023    3:12 PM 12/31/2022    9:05 AM 11/01/2022    1:12 PM 05/21/2022    9:27 AM 05/21/2022    9:22 AM  PHQ  2/9 Scores  PHQ - 2 Score 0 0 0 0 0 0 0  PHQ- 9 Score      0 0    Fall Risk    07/18/2023   10:03 AM 07/12/2023    2:37 PM 05/23/2023    8:08 AM 04/08/2023    3:12 PM 11/01/2022    1:12 PM  Fall Risk   Falls in the past year? 0 0 0 0 0  Number falls in past yr: 0  0 0 0  Injury with Fall? 0  0 0 0  Risk for fall due to : No Fall Risks   No Fall Risks No Fall Risks  Follow up Falls evaluation completed;Falls prevention discussed  Falls evaluation completed Falls evaluation completed Falls evaluation completed    MEDICARE RISK AT HOME: Medicare Risk at Home Any stairs in or around the home?: Yes (split level home) If so, are there any without handrails?: No Home free of loose throw rugs in walkways, pet beds, electrical cords, etc?: Yes Adequate lighting in your home to reduce risk of falls?: Yes Life alert?: No Use of a cane, walker or w/c?: No Grab bars in the bathroom?: No Shower chair or bench in shower?: No Elevated toilet seat or a handicapped toilet?: No  TIMED UP AND GO:  Was the test performed?  No    Cognitive Function:        07/18/2023   10:04 AM  6CIT Screen  What Year? 0 points  What month? 0 points  What time? 0 points  Count back from 20 0 points  Months in reverse 0 points  Repeat phrase 0 points  Total Score 0 points    Immunizations Immunization History  Administered Date(s) Administered   Fluad Quad(high Dose 65+) 03/21/2022   Influenza,inj,Quad PF,6+ Mos 04/29/2017, 05/06/2018, 03/17/2019   Influenza-Unspecified 04/29/2017, 03/17/2021, 03/28/2023   PFIZER(Purple Top)SARS-COV-2 Vaccination 08/21/2019, 09/12/2019, 04/22/2020   PNEUMOCOCCAL CONJUGATE-20 02/12/2022   Pfizer Covid-19 Vaccine Bivalent Booster 65yrs & up 10/07/2020   Pfizer(Comirnaty)Fall Seasonal Vaccine 12 years and older 03/21/2022   RSV,unspecified 04/19/2022   Tdap 01/29/2017, 01/29/2017   Unspecified SARS-COV-2 Vaccination 02/20/2023   Zoster Recombinant(Shingrix)  01/29/2020, 06/03/2020    TDAP status: Up to date - 01/29/2017  Flu Vaccine status: Up to date - 03/21/2022  Pneumococcal vaccine status: Up to date - 02/12/2022  Covid-19 vaccine status: Information provided on how to obtain vaccines.   Qualifies for Shingles Vaccine? Yes   Zostavax completed Yes   Shingrix Completed?: Yes  Screening Tests Health Maintenance  Topic Date Due   COVID-19 Vaccine (7 - 2024-25 season) 04/17/2023   Medicare Annual Wellness (AWV)  07/17/2024   MAMMOGRAM  06/30/2025   DTaP/Tdap/Td (3 - Td or Tdap) 01/30/2027   Colonoscopy  04/05/2028   Pneumonia Vaccine 67+ Years old  Completed   INFLUENZA VACCINE  Completed   DEXA SCAN  Completed   Hepatitis C Screening  Completed   Zoster Vaccines- Shingrix  Completed   HPV VACCINES  Aged Out    Health Maintenance  Health Maintenance Due  Topic Date Due   COVID-19 Vaccine (7 - 2024-25 season) 04/17/2023    Colorectal cancer screening: Type of screening: Colonoscopy. Completed 04/05/2021. Repeat every 7 years  Mammogram status: Completed 07/01/2023. Repeat every year  Bone Density status: Completed 08/14/2021. Results reflect: Bone density results: OSTEOPENIA. Repeat every 3-5 years.    Additional Screening:  Hepatitis C Screening: does qualify; Completed 01/29/2017  Vision Screening: Recommended annual ophthalmology exams for early detection of glaucoma and other disorders of the eye. Is the patient up to date with their annual eye exam?  Yes  Who is the provider or what is the name of the office in which the patient attends annual eye exams? Dr Charlotte Sanes - seen in 06/2023 If pt is not established with a provider, would they like to be referred to a provider to establish care? No .   Dental Screening: Recommended annual dental exams for proper oral hygiene  Community Resource Referral / Chronic Care Management: CRR required this visit?  No   CCM required this visit?  No     Plan:     I have  personally reviewed and noted the following in the patient's chart:   Medical and social history Use of alcohol, tobacco or illicit drugs  Current medications and supplements including opioid prescriptions. Patient is not currently taking opioid prescriptions. Functional ability and status Nutritional status Physical activity Advanced directives List of other physicians Hospitalizations, surgeries, and ER visits in previous 12 months Vitals Screenings to include cognitive, depression, and falls Referrals and appointments  In addition, I have reviewed and discussed with patient certain preventive protocols, quality metrics, and best practice recommendations. A written personalized care plan for preventive services as well as general preventive health recommendations were provided to patient.     Darreld Mclean, CMA   07/18/2023   After Visit Summary: (MyChart) Due to this being a telephonic visit, the after visit summary with patients personalized plan was offered to patient via MyChart   Nurse Notes: none

## 2023-07-29 ENCOUNTER — Ambulatory Visit (INDEPENDENT_AMBULATORY_CARE_PROVIDER_SITE_OTHER): Payer: Medicare Other | Admitting: Family Medicine

## 2023-07-29 ENCOUNTER — Encounter: Payer: Self-pay | Admitting: Family Medicine

## 2023-07-29 VITALS — BP 148/72 | HR 84 | Temp 98.1°F | Ht 62.0 in | Wt 183.4 lb

## 2023-07-29 DIAGNOSIS — J0111 Acute recurrent frontal sinusitis: Secondary | ICD-10-CM | POA: Diagnosis not present

## 2023-07-29 MED ORDER — AZITHROMYCIN 250 MG PO TABS
ORAL_TABLET | ORAL | 0 refills | Status: AC
Start: 2023-07-29 — End: 2023-08-03

## 2023-07-29 NOTE — Patient Instructions (Addendum)
 I have sent in azithromycin for you to take. Take 2 tablets today, then 1 tablet daily for the next 4 days.  May use flonase and continue sinus rinses.   Follow-up with me for new or worsening symptoms.

## 2023-07-29 NOTE — Progress Notes (Signed)
   Acute Office Visit  Subjective:     Patient ID: Theresa Frazier, female    DOB: Mar 13, 1956, 68 y.o.   MRN: [id number]  Chief Complaint  Patient presents with   URI    Cough, head congestion, body aches, "flu-like", lack of appetite, symptoms for the past 2 weeks. All symptoms have alleviated besides head/sinus congestion. Patient notes of green sputum with slight streaks of blood. Covid-     HPI Patient is in today for evaluation of nasal congestion with purulent discharge, for the last 14 days. Reports upper teeth have been sore during this illness Has tried sinus rinses with mild relief. Denies known sick contacts, Denies abdominal pain, nausea, vomiting, diarrhea, rash, fever, chills, other symptoms.  Medical hx as outlined below.  ROS Per HPI      Objective:    BP (!) 148/72   Pulse 84   Temp 98.1 F (36.7 C)   Ht 5\' 2"  (1.575 m)   Wt 183 lb 6.4 oz (83.2 kg)   SpO2 98%   BMI 33.54 kg/m    Physical Exam Vitals and nursing note reviewed.  Constitutional:      General: She is not in acute distress.    Appearance: She is normal weight.  HENT:     Head: Normocephalic and atraumatic.     Right Ear: Tympanic membrane and ear canal normal.     Left Ear: Tympanic membrane and ear canal normal.     Nose: Congestion present.     Mouth/Throat:     Mouth: Mucous membranes are moist.     Pharynx: Oropharynx is clear. No oropharyngeal exudate or posterior oropharyngeal erythema.     Comments: Oropharyngeal cobblestoning   Eyes:     Extraocular Movements: Extraocular movements intact.  Cardiovascular:     Rate and Rhythm: Normal rate and regular rhythm.     Heart sounds: Normal heart sounds.  Pulmonary:     Effort: Pulmonary effort is normal. No respiratory distress.     Breath sounds: No wheezing, rhonchi or rales.  Musculoskeletal:     Cervical back: Normal range of motion and neck supple.  Lymphadenopathy:     Cervical: No cervical adenopathy.  Skin:     General: Skin is warm and dry.  Neurological:     Mental Status: She is alert.    No results found for any visits on 07/29/23.     Assessment & Plan:  1. Acute recurrent frontal sinusitis (Primary)  - azithromycin (ZITHROMAX) 250 MG tablet; Take 2 tablets on day 1, then 1 tablet daily on days 2 through 5  Dispense: 6 tablet; Refill: 0   Meds ordered this encounter  Medications   azithromycin (ZITHROMAX) 250 MG tablet    Sig: Take 2 tablets on day 1, then 1 tablet daily on days 2 through 5    Dispense:  6 tablet    Refill:  0    Return if symptoms worsen or fail to improve.  Moshe Cipro, FNP

## 2023-10-02 DIAGNOSIS — H524 Presbyopia: Secondary | ICD-10-CM | POA: Diagnosis not present

## 2024-01-02 DIAGNOSIS — L814 Other melanin hyperpigmentation: Secondary | ICD-10-CM | POA: Diagnosis not present

## 2024-01-02 DIAGNOSIS — L578 Other skin changes due to chronic exposure to nonionizing radiation: Secondary | ICD-10-CM | POA: Diagnosis not present

## 2024-01-02 DIAGNOSIS — D225 Melanocytic nevi of trunk: Secondary | ICD-10-CM | POA: Diagnosis not present

## 2024-01-02 DIAGNOSIS — L570 Actinic keratosis: Secondary | ICD-10-CM | POA: Diagnosis not present

## 2024-02-06 DIAGNOSIS — D485 Neoplasm of uncertain behavior of skin: Secondary | ICD-10-CM | POA: Diagnosis not present

## 2024-02-06 DIAGNOSIS — L986 Other infiltrative disorders of the skin and subcutaneous tissue: Secondary | ICD-10-CM | POA: Diagnosis not present

## 2024-03-05 DIAGNOSIS — B078 Other viral warts: Secondary | ICD-10-CM | POA: Diagnosis not present

## 2024-03-05 DIAGNOSIS — L570 Actinic keratosis: Secondary | ICD-10-CM | POA: Diagnosis not present

## 2024-05-18 DIAGNOSIS — Z01419 Encounter for gynecological examination (general) (routine) without abnormal findings: Secondary | ICD-10-CM | POA: Diagnosis not present

## 2024-05-25 ENCOUNTER — Encounter: Payer: Medicare Other | Admitting: Internal Medicine

## 2024-06-16 ENCOUNTER — Other Ambulatory Visit: Payer: Self-pay | Admitting: Internal Medicine

## 2024-06-16 DIAGNOSIS — Z1231 Encounter for screening mammogram for malignant neoplasm of breast: Secondary | ICD-10-CM

## 2024-07-03 ENCOUNTER — Ambulatory Visit

## 2024-07-03 ENCOUNTER — Ambulatory Visit
Admission: RE | Admit: 2024-07-03 | Discharge: 2024-07-03 | Disposition: A | Source: Ambulatory Visit | Attending: Internal Medicine | Admitting: Internal Medicine

## 2024-07-03 DIAGNOSIS — Z1231 Encounter for screening mammogram for malignant neoplasm of breast: Secondary | ICD-10-CM

## 2024-07-08 ENCOUNTER — Ambulatory Visit: Payer: Self-pay | Admitting: Internal Medicine

## 2024-07-16 ENCOUNTER — Encounter: Payer: Medicare Other | Admitting: Internal Medicine

## 2024-07-20 ENCOUNTER — Ambulatory Visit: Payer: Medicare Other

## 2024-07-20 ENCOUNTER — Encounter: Payer: Medicare Other | Admitting: Internal Medicine

## 2024-07-28 ENCOUNTER — Ambulatory Visit
